# Patient Record
Sex: Female | Born: 1975 | Race: White | Hispanic: No | Marital: Married | Smoking: Never smoker
Health system: Southern US, Community
[De-identification: ages and names within clinical notes are randomized; demographics above are authoritative.]

## PROBLEM LIST (undated history)

## (undated) DIAGNOSIS — J45909 Unspecified asthma, uncomplicated: Secondary | ICD-10-CM

## (undated) DIAGNOSIS — L989 Disorder of the skin and subcutaneous tissue, unspecified: Secondary | ICD-10-CM

## (undated) DIAGNOSIS — F32A Depression, unspecified: Secondary | ICD-10-CM

## (undated) DIAGNOSIS — T7840XA Allergy, unspecified, initial encounter: Secondary | ICD-10-CM

## (undated) DIAGNOSIS — F329 Major depressive disorder, single episode, unspecified: Secondary | ICD-10-CM

## (undated) DIAGNOSIS — F419 Anxiety disorder, unspecified: Secondary | ICD-10-CM

## (undated) HISTORY — DX: Disorder of the skin and subcutaneous tissue, unspecified: L98.9

## (undated) HISTORY — DX: Unspecified asthma, uncomplicated: J45.909

## (undated) HISTORY — DX: Anxiety disorder, unspecified: F41.9

## (undated) HISTORY — DX: Allergy, unspecified, initial encounter: T78.40XA

## (undated) HISTORY — DX: Major depressive disorder, single episode, unspecified: F32.9

## (undated) HISTORY — DX: Depression, unspecified: F32.A

## (undated) HISTORY — PX: KNEE SURGERY: SHX244

---

## 2004-10-17 ENCOUNTER — Ambulatory Visit: Payer: Self-pay | Admitting: Unknown Physician Specialty

## 2007-01-02 ENCOUNTER — Observation Stay: Payer: Self-pay | Admitting: Obstetrics and Gynecology

## 2007-01-07 ENCOUNTER — Observation Stay: Payer: Self-pay

## 2007-01-15 ENCOUNTER — Inpatient Hospital Stay: Payer: Self-pay

## 2009-04-29 ENCOUNTER — Observation Stay: Payer: Self-pay | Admitting: Obstetrics and Gynecology

## 2009-05-10 ENCOUNTER — Observation Stay: Payer: Self-pay

## 2009-05-20 ENCOUNTER — Ambulatory Visit: Payer: Self-pay | Admitting: Obstetrics and Gynecology

## 2009-05-20 ENCOUNTER — Observation Stay: Payer: Self-pay | Admitting: Obstetrics and Gynecology

## 2009-05-21 ENCOUNTER — Ambulatory Visit: Payer: Self-pay

## 2009-05-30 ENCOUNTER — Inpatient Hospital Stay: Payer: Self-pay | Admitting: Obstetrics and Gynecology

## 2013-08-20 ENCOUNTER — Ambulatory Visit: Payer: Self-pay | Admitting: Obstetrics and Gynecology

## 2014-11-27 DIAGNOSIS — J302 Other seasonal allergic rhinitis: Secondary | ICD-10-CM | POA: Insufficient documentation

## 2015-01-22 ENCOUNTER — Ambulatory Visit: Payer: Self-pay | Admitting: Orthopedic Surgery

## 2015-11-09 ENCOUNTER — Other Ambulatory Visit: Payer: Self-pay | Admitting: Pain Medicine

## 2015-12-01 DIAGNOSIS — Z79899 Other long term (current) drug therapy: Secondary | ICD-10-CM | POA: Diagnosis not present

## 2015-12-01 DIAGNOSIS — Z862 Personal history of diseases of the blood and blood-forming organs and certain disorders involving the immune mechanism: Secondary | ICD-10-CM | POA: Diagnosis not present

## 2015-12-01 DIAGNOSIS — Z0000 Encounter for general adult medical examination without abnormal findings: Secondary | ICD-10-CM | POA: Diagnosis not present

## 2015-12-01 DIAGNOSIS — Z9189 Other specified personal risk factors, not elsewhere classified: Secondary | ICD-10-CM | POA: Diagnosis not present

## 2015-12-01 DIAGNOSIS — E538 Deficiency of other specified B group vitamins: Secondary | ICD-10-CM | POA: Diagnosis not present

## 2015-12-01 DIAGNOSIS — Z13220 Encounter for screening for lipoid disorders: Secondary | ICD-10-CM | POA: Diagnosis not present

## 2016-03-06 DIAGNOSIS — H7201 Central perforation of tympanic membrane, right ear: Secondary | ICD-10-CM | POA: Diagnosis not present

## 2016-05-10 DIAGNOSIS — H7201 Central perforation of tympanic membrane, right ear: Secondary | ICD-10-CM | POA: Diagnosis not present

## 2016-10-03 DIAGNOSIS — H5203 Hypermetropia, bilateral: Secondary | ICD-10-CM | POA: Diagnosis not present

## 2016-10-20 ENCOUNTER — Encounter: Payer: Self-pay | Admitting: Physician Assistant

## 2016-10-20 ENCOUNTER — Ambulatory Visit: Payer: Self-pay | Admitting: Physician Assistant

## 2016-10-20 VITALS — BP 110/79 | HR 96 | Temp 98.7°F

## 2016-10-20 DIAGNOSIS — H6981 Other specified disorders of Eustachian tube, right ear: Secondary | ICD-10-CM

## 2016-10-20 NOTE — Progress Notes (Signed)
S:  C/o ears popping and being stopped up, no drainage from ears, no fever/chills, no cough or congestion, some sinus pressure, remainder ros neg, hx of ruptured ear drum on r side Using otc meds without relief  O:  Vitals wnl, nad, tms dull b/l, nasal mucosa swollen, throat wnl, neck supple no lymph, lungs c t a, cv rrr, neuro intact  A: acute eustachean tube dysfunction  P: flonase, zyrtec d;  return if not improving in 3 to 5 days, return earlier if worsening

## 2016-12-04 ENCOUNTER — Encounter: Payer: Self-pay | Admitting: Primary Care

## 2016-12-04 ENCOUNTER — Ambulatory Visit (INDEPENDENT_AMBULATORY_CARE_PROVIDER_SITE_OTHER): Payer: 59 | Admitting: Primary Care

## 2016-12-04 VITALS — BP 122/78 | HR 58 | Temp 97.4°F | Ht 68.25 in | Wt 162.1 lb

## 2016-12-04 DIAGNOSIS — J302 Other seasonal allergic rhinitis: Secondary | ICD-10-CM | POA: Diagnosis not present

## 2016-12-04 DIAGNOSIS — Z0000 Encounter for general adult medical examination without abnormal findings: Secondary | ICD-10-CM | POA: Diagnosis not present

## 2016-12-04 DIAGNOSIS — E559 Vitamin D deficiency, unspecified: Secondary | ICD-10-CM

## 2016-12-04 DIAGNOSIS — Z0001 Encounter for general adult medical examination with abnormal findings: Secondary | ICD-10-CM | POA: Insufficient documentation

## 2016-12-04 LAB — LIPID PANEL
CHOL/HDL RATIO: 2
Cholesterol: 176 mg/dL (ref 0–200)
HDL: 92.7 mg/dL (ref >39.00)
LDL Cholesterol: 70 mg/dL (ref 0–99)
NONHDL: 83.09
Triglycerides: 64 mg/dL (ref 0.0–149.0)
VLDL: 12.8 mg/dL (ref 0.0–40.0)

## 2016-12-04 LAB — COMPREHENSIVE METABOLIC PANEL
ALT: 11 U/L (ref 0–35)
AST: 20 U/L (ref 0–37)
Albumin: 4.2 g/dL (ref 3.5–5.2)
Alkaline Phosphatase: 61 U/L (ref 39–117)
BILIRUBIN TOTAL: 0.7 mg/dL (ref 0.2–1.2)
BUN: 13 mg/dL (ref 6–23)
CO2: 28 meq/L (ref 19–32)
Calcium: 9.2 mg/dL (ref 8.4–10.5)
Chloride: 103 mEq/L (ref 96–112)
Creatinine, Ser: 0.85 mg/dL (ref 0.40–1.20)
GFR: 79.16 mL/min (ref >60.00)
GLUCOSE: 99 mg/dL (ref 70–99)
Potassium: 4.5 mEq/L (ref 3.5–5.1)
SODIUM: 139 meq/L (ref 135–145)
Total Protein: 7 g/dL (ref 6.0–8.3)

## 2016-12-04 LAB — VITAMIN D 25 HYDROXY (VIT D DEFICIENCY, FRACTURES): VITD: 62.18 ng/mL (ref 30.00–100.00)

## 2016-12-04 NOTE — Progress Notes (Signed)
Subjective:    Patient ID: Dawn Hamilton, female    DOB: Jul 24, 1976, 41 y.o.   MRN: [id number]  HPI  Dawn Hamilton is a 41 year old female who presents today to establish care, discuss the problems mentioned below, and for complete physical. Will obtain old records.  1) Anxiety and Depression: Diagnosed in 2010 due to acute illness of her son. She was managed on Clonazepam temporarily. She's not experienced symptoms in years.   Immunizations: -Tetanus: Completed in 2012 -Influenza: Completed in September 2017   Diet: She endorses a healthy diet. Breakfast: Egg whites, protein bar Lunch: Protein, veggies Dinner: Protein veggies Snacks: Veggies, rice cake, chex mix Desserts: Occasionally Beverages: Water, Coffee, occasional soda  Exercise: She jogs/does cardio 4-5 days weekly. Eye exam: Completed in 2017 Dental exam: Completes semi-annually Pap Smear: Completed in 2015, follows with GYN.   Review of Systems  Constitutional: Negative for unexpected weight change.  HENT: Negative for rhinorrhea.   Respiratory: Negative for cough and shortness of breath.   Cardiovascular: Negative for chest pain.  Gastrointestinal: Negative for constipation and diarrhea.  Genitourinary: Negative for difficulty urinating and menstrual problem.  Musculoskeletal: Negative for arthralgias and myalgias.  Skin: Negative for rash.  Allergic/Immunologic: Positive for environmental allergies.  Neurological: Negative for dizziness, numbness and headaches.  Psychiatric/Behavioral:       Denies concerns for anxiety or depression       Past Medical History:  Diagnosis Date  . Allergy   . Anxiety   . Asthma    exercise induced  . Depression   . Precancerous skin lesion      Social History   Social History  . Marital status: Married    Spouse name: N/A  . Number of children: N/A  . Years of education: N/A   Occupational History  . Not on file.   Social History Main Topics    . Smoking status: Never Smoker  . Smokeless tobacco: Never Used  . Alcohol use Not on file  . Drug use: Unknown  . Sexual activity: Not on file   Other Topics Concern  . Not on file   Social History Narrative   Married.   2 children.   Works as a Psychologist, forensic.   Enjoys spending time outdoors, going to the lake.    No past surgical history on file.  Family History  Problem Relation Age of Onset  . Colon cancer Maternal Grandfather   . Diabetes Maternal Grandfather   . Bladder Cancer Paternal Grandmother     Allergies  Allergen Reactions  . Codeine Nausea Only  . Wound Dressing Adhesive   . Benzoin Rash  . Iodine Rash    No current outpatient prescriptions on file prior to visit.   No current facility-administered medications on file prior to visit.     BP 122/78   Pulse (!) 58   Temp 97.4 F (36.3 C) (Oral)   Ht 5' 8.25" (1.734 m)   Wt 162 lb 1.9 oz (73.5 kg)   LMP 11/28/2016   SpO2 98%   BMI 24.47 kg/m    Objective:   Physical Exam  Constitutional: She is oriented to person, place, and time. She appears well-nourished.  HENT:  Right Ear: Tympanic membrane and ear canal normal.  Left Ear: Tympanic membrane and ear canal normal.  Nose: Nose normal.  Mouth/Throat: Oropharynx is clear and moist.  Eyes: Conjunctivae and EOM are normal. Pupils are equal, round, and reactive to light.  Neck:  Neck supple. No thyromegaly present.  Cardiovascular: Normal rate and regular rhythm.   No murmur heard. Pulmonary/Chest: Effort normal and breath sounds normal. She has no rales.  Abdominal: Soft. Bowel sounds are normal. There is no tenderness.  Musculoskeletal: Normal range of motion.  Lymphadenopathy:    She has no cervical adenopathy.  Neurological: She is alert and oriented to person, place, and time. She has normal reflexes. No cranial nerve deficit.  Skin: Skin is warm and dry. No rash noted.  Psychiatric: She has a normal mood and affect.           Assessment & Plan:

## 2016-12-04 NOTE — Assessment & Plan Note (Signed)
Immunizations UTD. Pap due this Fall, follows with GYN. Commended her on her healthy lifestyle, continue to exercise. Exam unremarkable. Labs pending. Follow up in 1 year for repeat physical.

## 2016-12-04 NOTE — Assessment & Plan Note (Signed)
Stable on Claritin. Continue same.

## 2016-12-04 NOTE — Patient Instructions (Signed)
Complete lab work prior to leaving today. I will notify you of your results once received.   Continue your efforts towards a healthy lifestyle.  Continue exercising. You should be getting 150 minutes of moderate intensity exercise weekly.  Ensure you are consuming 64 ounces of water daily.  Be sure to schedule your Pap this coming Fall.  Follow up in 1 year for an annual physical or sooner if needed.  It was a pleasure to meet you today! Please don't hesitate to call me with any questions. Welcome to [facility]!

## 2017-06-21 DIAGNOSIS — H66001 Acute suppurative otitis media without spontaneous rupture of ear drum, right ear: Secondary | ICD-10-CM | POA: Diagnosis not present

## 2017-10-10 DIAGNOSIS — H5202 Hypermetropia, left eye: Secondary | ICD-10-CM | POA: Diagnosis not present

## 2017-11-26 ENCOUNTER — Other Ambulatory Visit: Payer: Self-pay | Admitting: Primary Care

## 2017-11-26 DIAGNOSIS — Z0000 Encounter for general adult medical examination without abnormal findings: Secondary | ICD-10-CM

## 2017-11-29 ENCOUNTER — Encounter: Payer: Self-pay | Admitting: Primary Care

## 2017-11-29 DIAGNOSIS — R7989 Other specified abnormal findings of blood chemistry: Secondary | ICD-10-CM

## 2017-12-05 ENCOUNTER — Other Ambulatory Visit (INDEPENDENT_AMBULATORY_CARE_PROVIDER_SITE_OTHER): Payer: 59

## 2017-12-05 DIAGNOSIS — Z0000 Encounter for general adult medical examination without abnormal findings: Secondary | ICD-10-CM | POA: Diagnosis not present

## 2017-12-05 DIAGNOSIS — R7989 Other specified abnormal findings of blood chemistry: Secondary | ICD-10-CM

## 2017-12-05 LAB — COMPREHENSIVE METABOLIC PANEL
ALT: 13 U/L (ref 0–35)
AST: 19 U/L (ref 0–37)
Albumin: 4.3 g/dL (ref 3.5–5.2)
Alkaline Phosphatase: 64 U/L (ref 39–117)
BUN: 10 mg/dL (ref 6–23)
CHLORIDE: 105 meq/L (ref 96–112)
CO2: 24 meq/L (ref 19–32)
Calcium: 9.6 mg/dL (ref 8.4–10.5)
Creatinine, Ser: 0.88 mg/dL (ref 0.40–1.20)
GFR: 75.66 mL/min (ref >60.00)
GLUCOSE: 82 mg/dL (ref 70–99)
POTASSIUM: 4.1 meq/L (ref 3.5–5.1)
SODIUM: 142 meq/L (ref 135–145)
Total Bilirubin: 0.4 mg/dL (ref 0.2–1.2)
Total Protein: 7.2 g/dL (ref 6.0–8.3)

## 2017-12-05 LAB — LIPID PANEL
Cholesterol: 182 mg/dL (ref 0–200)
HDL: 88 mg/dL (ref >39.00)
LDL CALC: 78 mg/dL (ref 0–99)
NONHDL: 93.64
Total CHOL/HDL Ratio: 2
Triglycerides: 78 mg/dL (ref 0.0–149.0)
VLDL: 15.6 mg/dL (ref 0.0–40.0)

## 2017-12-05 LAB — VITAMIN D 25 HYDROXY (VIT D DEFICIENCY, FRACTURES): VITD: 52.57 ng/mL (ref 30.00–100.00)

## 2017-12-05 LAB — TSH: TSH: 1.38 u[IU]/mL (ref 0.35–4.50)

## 2017-12-07 ENCOUNTER — Encounter: Payer: Self-pay | Admitting: Primary Care

## 2017-12-07 ENCOUNTER — Ambulatory Visit (INDEPENDENT_AMBULATORY_CARE_PROVIDER_SITE_OTHER): Payer: 59 | Admitting: Primary Care

## 2017-12-07 VITALS — BP 104/60 | HR 71 | Temp 98.2°F | Ht 68.75 in | Wt 166.8 lb

## 2017-12-07 DIAGNOSIS — Z0000 Encounter for general adult medical examination without abnormal findings: Secondary | ICD-10-CM

## 2017-12-07 DIAGNOSIS — J302 Other seasonal allergic rhinitis: Secondary | ICD-10-CM | POA: Diagnosis not present

## 2017-12-07 NOTE — Assessment & Plan Note (Addendum)
Immunizations UTD. Pap smear due, she will see GYN. She will get her mammogram next month through her gynecologist once she turns 40. Discussed to continue working on diet, start exercising. Exam unremarkable. Labs unremarkable. Follow up in 1 year.

## 2017-12-07 NOTE — Progress Notes (Signed)
Subjective:    Patient ID: Dawn Hamilton, female    DOB: 1976-02-12, 42 y.o.   MRN: [id number]  HPI  Dawn Hamilton is a 42 year old female who presents today for complete physical.  Immunizations: -Tetanus: Completed in 2012 -Influenza: Completed in October 2018   Diet: She endorses a healthy diet. Breakfast: Yogurt, egg whites Lunch: Chicken, rice, vegetables, salads Dinner: Salads, meat, vegetables Snacks: Chex Mix, nuts, fruit Desserts: Occasionally  Beverages: Water, occasional soda  Exercise: She does not regularly exercise  Eye exam: Completed in 2018 Dental exam: Completes semi-annually Pap Smear: Following with GYN, due.   Review of Systems  Constitutional: Negative for unexpected weight change.  HENT: Negative for rhinorrhea.   Respiratory: Negative for cough and shortness of breath.   Cardiovascular: Negative for chest pain.  Gastrointestinal: Negative for constipation and diarrhea.  Genitourinary: Negative for difficulty urinating and menstrual problem.  Musculoskeletal: Negative for arthralgias and myalgias.  Skin: Negative for rash.  Allergic/Immunologic: Negative for environmental allergies.  Neurological: Negative for dizziness, numbness and headaches.  Psychiatric/Behavioral:       Denies concerns for anxiety or depression       Past Medical History:  Diagnosis Date  . Allergy   . Anxiety   . Asthma    exercise induced  . Depression   . Precancerous skin lesion      Social History   Socioeconomic History  . Marital status: Married    Spouse name: Not on file  . Number of children: Not on file  . Years of education: Not on file  . Highest education level: Not on file  Social Needs  . Financial resource strain: Not on file  . Food insecurity - worry: Not on file  . Food insecurity - inability: Not on file  . Transportation needs - medical: Not on file  . Transportation needs - non-medical: Not on file  Occupational History    . Not on file  Tobacco Use  . Smoking status: Never Smoker  . Smokeless tobacco: Never Used  Substance and Sexual Activity  . Alcohol use: Not on file  . Drug use: Not on file  . Sexual activity: Not on file  Other Topics Concern  . Not on file  Social History Narrative   Married.   2 children.   Works as a Psychologist, forensic.   Enjoys spending time outdoors, going to the lake.     Family History  Problem Relation Age of Onset  . Colon cancer Maternal Grandfather   . Diabetes Maternal Grandfather   . Bladder Cancer Paternal Grandmother     Allergies  Allergen Reactions  . Codeine Nausea Only  . Wound Dressing Adhesive   . Benzoin Rash  . Iodine Rash    Current Outpatient Medications on File Prior to Visit  Medication Sig Dispense Refill  . Cholecalciferol (VITAMIN D) 2000 units tablet     . loratadine (CLARITIN) 10 MG tablet Take 10 mg by mouth daily as needed for allergies.    . Multiple Vitamins-Minerals (MULTIVITAMIN ADULT) TABS      No current facility-administered medications on file prior to visit.     BP 104/60   Pulse 71   Temp 98.2 F (36.8 C) (Oral)   Ht 5' 8.75" (1.746 m)   Wt 166 lb 12.8 oz (75.7 kg)   LMP 11/23/2017   SpO2 99%   BMI 24.81 kg/m    Objective:   Physical Exam  Constitutional: She is  oriented to person, place, and time. She appears well-nourished.  HENT:  Right Ear: Tympanic membrane and ear canal normal.  Left Ear: Tympanic membrane and ear canal normal.  Nose: Nose normal.  Mouth/Throat: Oropharynx is clear and moist.  Eyes: Conjunctivae and EOM are normal. Pupils are equal, round, and reactive to light.  Neck: Neck supple. No thyromegaly present.  Cardiovascular: Normal rate and regular rhythm.  No murmur heard. Pulmonary/Chest: Effort normal and breath sounds normal. She has no rales.  Abdominal: Soft. Bowel sounds are normal. There is no tenderness.  Musculoskeletal: Normal range of motion.  Lymphadenopathy:    She has no  cervical adenopathy.  Neurological: She is alert and oriented to person, place, and time. She has normal reflexes. No cranial nerve deficit.  Skin: Skin is warm and dry. No rash noted.  Psychiatric: She has a normal mood and affect.          Assessment & Plan:

## 2017-12-07 NOTE — Assessment & Plan Note (Signed)
Stable on PRN claritin, continue same.

## 2017-12-07 NOTE — Patient Instructions (Signed)
Start exercising. You should be getting 150 minutes of moderate intensity exercise weekly.  Ensure you are consuming 64 ounces of water daily.  Continue working on a healthy diet.  Follow up with gynecology as discussed.  Follow up in 1 year for your annual exam or sooner if needed.  It was a pleasure to see you today!   Preventive Care 18-39 Years, Female Preventive care refers to lifestyle choices and visits with your health care provider that can promote health and wellness. What does preventive care include?  A yearly physical exam. This is also called an annual well check.  Dental exams once or twice a year.  Routine eye exams. Ask your health care provider how often you should have your eyes checked.  Personal lifestyle choices, including: ? Daily care of your teeth and gums. ? Regular physical activity. ? Eating a healthy diet. ? Avoiding tobacco and drug use. ? Limiting alcohol use. ? Practicing safe sex. ? Taking vitamin and mineral supplements as recommended by your health care provider. What happens during an annual well check? The services and screenings done by your health care provider during your annual well check will depend on your age, overall health, lifestyle risk factors, and family history of disease. Counseling Your health care provider may ask you questions about your:  Alcohol use.  Tobacco use.  Drug use.  Emotional well-being.  Home and relationship well-being.  Sexual activity.  Eating habits.  Work and work Statistician.  Method of birth control.  Menstrual cycle.  Pregnancy history.  Screening You may have the following tests or measurements:  Height, weight, and BMI.  Diabetes screening. This is done by checking your blood sugar (glucose) after you have not eaten for a while (fasting).  Blood pressure.  Lipid and cholesterol levels. These may be checked every 5 years starting at age 74.  Skin check.  Hepatitis C  blood test.  Hepatitis B blood test.  Sexually transmitted disease (STD) testing.  BRCA-related cancer screening. This may be done if you have a family history of breast, ovarian, tubal, or peritoneal cancers.  Pelvic exam and Pap test. This may be done every 3 years starting at age 84. Starting at age 7, this may be done every 5 years if you have a Pap test in combination with an HPV test.  Discuss your test results, treatment options, and if necessary, the need for more tests with your health care provider. Vaccines Your health care provider may recommend certain vaccines, such as:  Influenza vaccine. This is recommended every year.  Tetanus, diphtheria, and acellular pertussis (Tdap, Td) vaccine. You may need a Td booster every 10 years.  Varicella vaccine. You may need this if you have not been vaccinated.  HPV vaccine. If you are 78 or younger, you may need three doses over 6 months.  Measles, mumps, and rubella (MMR) vaccine. You may need at least one dose of MMR. You may also need a second dose.  Pneumococcal 13-valent conjugate (PCV13) vaccine. You may need this if you have certain conditions and were not previously vaccinated.  Pneumococcal polysaccharide (PPSV23) vaccine. You may need one or two doses if you smoke cigarettes or if you have certain conditions.  Meningococcal vaccine. One dose is recommended if you are age 57-21 years and a first-year college student living in a residence hall, or if you have one of several medical conditions. You may also need additional booster doses.  Hepatitis A vaccine. You may need this  if you have certain conditions or if you travel or work in places where you may be exposed to hepatitis A.  Hepatitis B vaccine. You may need this if you have certain conditions or if you travel or work in places where you may be exposed to hepatitis B.  Haemophilus influenzae type b (Hib) vaccine. You may need this if you have certain risk  factors.  Talk to your health care provider about which screenings and vaccines you need and how often you need them. This information is not intended to replace advice given to you by your health care provider. Make sure you discuss any questions you have with your health care provider. Document Released: 12/26/2001 Document Revised: 07/19/2016 Document Reviewed: 08/31/2015 Elsevier Interactive Patient Education  [employer].

## 2017-12-26 ENCOUNTER — Other Ambulatory Visit: Payer: Self-pay | Admitting: Obstetrics and Gynecology

## 2017-12-26 DIAGNOSIS — Z1231 Encounter for screening mammogram for malignant neoplasm of breast: Secondary | ICD-10-CM | POA: Diagnosis not present

## 2017-12-26 DIAGNOSIS — Z01419 Encounter for gynecological examination (general) (routine) without abnormal findings: Secondary | ICD-10-CM | POA: Diagnosis not present

## 2017-12-26 DIAGNOSIS — Z1211 Encounter for screening for malignant neoplasm of colon: Secondary | ICD-10-CM | POA: Diagnosis not present

## 2017-12-26 DIAGNOSIS — Z124 Encounter for screening for malignant neoplasm of cervix: Secondary | ICD-10-CM | POA: Diagnosis not present

## 2018-01-11 ENCOUNTER — Ambulatory Visit
Admission: RE | Admit: 2018-01-11 | Discharge: 2018-01-11 | Disposition: A | Discharge status: Home / Self Care | Payer: 59 | Source: Ambulatory Visit | Attending: Obstetrics and Gynecology | Admitting: Obstetrics and Gynecology

## 2018-01-11 DIAGNOSIS — Z1231 Encounter for screening mammogram for malignant neoplasm of breast: Secondary | ICD-10-CM | POA: Diagnosis not present

## 2018-11-25 ENCOUNTER — Other Ambulatory Visit: Payer: Self-pay | Admitting: Primary Care

## 2018-11-25 DIAGNOSIS — Z0000 Encounter for general adult medical examination without abnormal findings: Secondary | ICD-10-CM

## 2018-11-30 DIAGNOSIS — Z862 Personal history of diseases of the blood and blood-forming organs and certain disorders involving the immune mechanism: Secondary | ICD-10-CM

## 2018-11-30 DIAGNOSIS — Z0000 Encounter for general adult medical examination without abnormal findings: Secondary | ICD-10-CM

## 2018-12-04 ENCOUNTER — Other Ambulatory Visit (INDEPENDENT_AMBULATORY_CARE_PROVIDER_SITE_OTHER): Payer: 59

## 2018-12-04 DIAGNOSIS — Z0000 Encounter for general adult medical examination without abnormal findings: Secondary | ICD-10-CM

## 2018-12-04 DIAGNOSIS — Z862 Personal history of diseases of the blood and blood-forming organs and certain disorders involving the immune mechanism: Secondary | ICD-10-CM

## 2018-12-04 LAB — COMPREHENSIVE METABOLIC PANEL
ALT: 13 U/L (ref 0–35)
AST: 18 U/L (ref 0–37)
Albumin: 4.3 g/dL (ref 3.5–5.2)
Alkaline Phosphatase: 69 U/L (ref 39–117)
BUN: 10 mg/dL (ref 6–23)
CO2: 29 meq/L (ref 19–32)
Calcium: 9.7 mg/dL (ref 8.4–10.5)
Chloride: 103 mEq/L (ref 96–112)
Creatinine, Ser: 0.87 mg/dL (ref 0.40–1.20)
GFR: 71.77 mL/min (ref >60.00)
Glucose, Bld: 102 mg/dL — ABNORMAL HIGH (ref 70–99)
Potassium: 4.8 mEq/L (ref 3.5–5.1)
Sodium: 138 mEq/L (ref 135–145)
Total Bilirubin: 0.3 mg/dL (ref 0.2–1.2)
Total Protein: 6.7 g/dL (ref 6.0–8.3)

## 2018-12-04 LAB — T4, FREE: Free T4: 0.87 ng/dL (ref 0.60–1.60)

## 2018-12-04 LAB — CBC
HCT: 38.2 % (ref 36.0–46.0)
Hemoglobin: 12.3 g/dL (ref 12.0–15.0)
MCHC: 32.1 g/dL (ref 30.0–36.0)
MCV: 76.5 fl — ABNORMAL LOW (ref 78.0–100.0)
Platelets: 381 10*3/uL (ref 150.0–400.0)
RBC: 4.99 Mil/uL (ref 3.87–5.11)
RDW: 14.7 % (ref 11.5–15.5)
WBC: 6.1 10*3/uL (ref 4.0–10.5)

## 2018-12-04 LAB — LIPID PANEL
CHOL/HDL RATIO: 3
Cholesterol: 168 mg/dL (ref 0–200)
HDL: 66.1 mg/dL (ref >39.00)
LDL CALC: 79 mg/dL (ref 0–99)
NonHDL: 101.93
Triglycerides: 114 mg/dL (ref 0.0–149.0)
VLDL: 22.8 mg/dL (ref 0.0–40.0)

## 2018-12-04 LAB — IBC PANEL
IRON: 16 ug/dL — AB (ref 42–145)
Saturation Ratios: 3.7 % — ABNORMAL LOW (ref 20.0–50.0)
Transferrin: 305 mg/dL (ref 212.0–360.0)

## 2018-12-04 LAB — VITAMIN D 25 HYDROXY (VIT D DEFICIENCY, FRACTURES): VITD: 38.88 ng/mL (ref 30.00–100.00)

## 2018-12-04 LAB — TSH: TSH: 1.08 u[IU]/mL (ref 0.35–4.50)

## 2018-12-09 ENCOUNTER — Encounter: Payer: Self-pay | Admitting: Primary Care

## 2018-12-09 ENCOUNTER — Ambulatory Visit (INDEPENDENT_AMBULATORY_CARE_PROVIDER_SITE_OTHER): Payer: 59 | Admitting: Primary Care

## 2018-12-09 VITALS — BP 120/76 | HR 65 | Temp 98.0°F | Ht 68.75 in | Wt 171.8 lb

## 2018-12-09 DIAGNOSIS — D509 Iron deficiency anemia, unspecified: Secondary | ICD-10-CM | POA: Diagnosis not present

## 2018-12-09 DIAGNOSIS — Z0000 Encounter for general adult medical examination without abnormal findings: Secondary | ICD-10-CM

## 2018-12-09 DIAGNOSIS — Z1239 Encounter for other screening for malignant neoplasm of breast: Secondary | ICD-10-CM | POA: Diagnosis not present

## 2018-12-09 DIAGNOSIS — J302 Other seasonal allergic rhinitis: Secondary | ICD-10-CM | POA: Diagnosis not present

## 2018-12-09 NOTE — Assessment & Plan Note (Signed)
Doing well on PRN Claritin, also using Essential Oils. Continue to monitor.

## 2018-12-09 NOTE — Assessment & Plan Note (Signed)
Chronic. Previously taking oral iron, no recent use. Denies menorrhagia, rectal bleeding. No obvious cause for deficiency.  Will have her resume ferrous sulfate 325 mg daily and repeat labs in 2 months.

## 2018-12-09 NOTE — Assessment & Plan Note (Signed)
Immunizations UTD. Pap smear UTD. Mammogram due, orders placed. Encouraged a healthy diet, regular exercise. Exam unremarkable. Labs reviewed.  Follow up in 1 year for CPE.

## 2018-12-09 NOTE — Patient Instructions (Addendum)
Start ferrous sulfate 325 mg (Iron 65) once daily. Please notify me if you develop constipation.  Continue exercising. You should be getting 150 minutes of moderate intensity exercise weekly.  Continue to work on a healthy diet. Ensure you are consuming 64 ounces of water daily.  Call the [facility] to schedule your mammogram.  Schedule a lab only appointment for 2 months to recheck iron levels.   We will see you next year for your annual exam or sooner if needed.  It was a pleasure to see you today!   Preventive Care 40-64 Years, Female Preventive care refers to lifestyle choices and visits with your health care provider that can promote health and wellness. What does preventive care include?   A yearly physical exam. This is also called an annual well check.  Dental exams once or twice a year.  Routine eye exams. Ask your health care provider how often you should have your eyes checked.  Personal lifestyle choices, including: ? Daily care of your teeth and gums. ? Regular physical activity. ? Eating a healthy diet. ? Avoiding tobacco and drug use. ? Limiting alcohol use. ? Practicing safe sex. ? Taking low-dose aspirin daily starting at age 60. ? Taking vitamin and mineral supplements as recommended by your health care provider. What happens during an annual well check? The services and screenings done by your health care provider during your annual well check will depend on your age, overall health, lifestyle risk factors, and family history of disease. Counseling Your health care provider may ask you questions about your:  Alcohol use.  Tobacco use.  Drug use.  Emotional well-being.  Home and relationship well-being.  Sexual activity.  Eating habits.  Work and work Statistician.  Method of birth control.  Menstrual cycle.  Pregnancy history. Screening You may have the following tests or measurements:  Height, weight, and BMI.  Blood  pressure.  Lipid and cholesterol levels. These may be checked every 5 years, or more frequently if you are over 67 years old.  Skin check.  Lung cancer screening. You may have this screening every year starting at age 75 if you have a 30-pack-year history of smoking and currently smoke or have quit within the past 15 years.  Colorectal cancer screening. All adults should have this screening starting at age 21 and continuing until age 19. Your health care provider may recommend screening at age 12. You will have tests every 1-10 years, depending on your results and the type of screening test. People at increased risk should start screening at an earlier age. Screening tests may include: ? Guaiac-based fecal occult blood testing. ? Fecal immunochemical test (FIT). ? Stool DNA test. ? Virtual colonoscopy. ? Sigmoidoscopy. During this test, a flexible tube with a tiny camera (sigmoidoscope) is used to examine your rectum and lower colon. The sigmoidoscope is inserted through your anus into your rectum and lower colon. ? Colonoscopy. During this test, a long, thin, flexible tube with a tiny camera (colonoscope) is used to examine your entire colon and rectum.  Hepatitis C blood test.  Hepatitis B blood test.  Sexually transmitted disease (STD) testing.  Diabetes screening. This is done by checking your blood sugar (glucose) after you have not eaten for a while (fasting). You may have this done every 1-3 years.  Mammogram. This may be done every 1-2 years. Talk to your health care provider about when you should start having regular mammograms. This may depend on whether you have  a family history of breast cancer.  BRCA-related cancer screening. This may be done if you have a family history of breast, ovarian, tubal, or peritoneal cancers.  Pelvic exam and Pap test. This may be done every 3 years starting at age 76. Starting at age 46, this may be done every 5 years if you have a Pap test in  combination with an HPV test.  Bone density scan. This is done to screen for osteoporosis. You may have this scan if you are at high risk for osteoporosis. Discuss your test results, treatment options, and if necessary, the need for more tests with your health care provider. Vaccines Your health care provider may recommend certain vaccines, such as:  Influenza vaccine. This is recommended every year.  Tetanus, diphtheria, and acellular pertussis (Tdap, Td) vaccine. You may need a Td booster every 10 years.  Varicella vaccine. You may need this if you have not been vaccinated.  Zoster vaccine. You may need this after age 60.  Measles, mumps, and rubella (MMR) vaccine. You may need at least one dose of MMR if you were born in 1957 or later. You may also need a second dose.  Pneumococcal 13-valent conjugate (PCV13) vaccine. You may need this if you have certain conditions and were not previously vaccinated.  Pneumococcal polysaccharide (PPSV23) vaccine. You may need one or two doses if you smoke cigarettes or if you have certain conditions.  Meningococcal vaccine. You may need this if you have certain conditions.  Hepatitis A vaccine. You may need this if you have certain conditions or if you travel or work in places where you may be exposed to hepatitis A.  Hepatitis B vaccine. You may need this if you have certain conditions or if you travel or work in places where you may be exposed to hepatitis B.  Haemophilus influenzae type b (Hib) vaccine. You may need this if you have certain conditions. Talk to your health care provider about which screenings and vaccines you need and how often you need them. This information is not intended to replace advice given to you by your health care provider. Make sure you discuss any questions you have with your health care provider. Document Released: 11/26/2015 Document Revised: 12/20/2017 Document Reviewed: 08/31/2015 Elsevier Interactive Patient  Education  2019 [employer].

## 2018-12-09 NOTE — Progress Notes (Signed)
Subjective:    Patient ID: Dawn Hamilton, female    DOB: [birth year], 43 y.o.   MRN: [id number]  HPI  Dawn Hamilton is a 43 year old female who presents today for complete physical.  Immunizations: -Tetanus: Completed in 2012 -Influenza: Completed this season 2019   Diet: She endorses a healthy diet Breakfast: Yogurt with fruit, cereal, protein bar Lunch: Chicken, salad, sweet potato  Dinner: Meat, vegetable, starch Snacks: Occasionally nuts, vegetables, fruit Desserts: None Beverages: Water, coffee  Exercise: She is walking/jogging 3-4 days weekly, 30 minutes  Eye exam: Completed in 2019 Dental exam: Completes semi-annually  Pap Smear: Completed in February 2019 Mammogram: Completed in 2019   Review of Systems  Constitutional: Negative for unexpected weight change.  HENT: Negative for rhinorrhea.   Respiratory: Negative for cough and shortness of breath.   Cardiovascular: Negative for chest pain.  Gastrointestinal: Negative for constipation and diarrhea.  Genitourinary: Negative for difficulty urinating and menstrual problem.  Musculoskeletal: Negative for arthralgias and myalgias.  Skin: Negative for rash.  Allergic/Immunologic: Negative for environmental allergies.  Neurological: Negative for dizziness, numbness and headaches.  Psychiatric/Behavioral: The patient is not nervous/anxious.        Past Medical History:  Diagnosis Date  . Allergy   . Anxiety   . Asthma    exercise induced  . Depression   . Precancerous skin lesion      Social History   Socioeconomic History  . Marital status: Married    Spouse name: Not on file  . Number of children: Not on file  . Years of education: Not on file  . Highest education level: Not on file  Occupational History  . Not on file  Social Needs  . Financial resource strain: Not on file  . Food insecurity:    Worry: Not on file    Inability: Not on file  . Transportation needs:    Medical: Not on  file    Non-medical: Not on file  Tobacco Use  . Smoking status: Never Smoker  . Smokeless tobacco: Never Used  Substance and Sexual Activity  . Alcohol use: Not on file  . Drug use: Not on file  . Sexual activity: Not on file  Lifestyle  . Physical activity:    Days per week: Not on file    Minutes per session: Not on file  . Stress: Not on file  Relationships  . Social connections:    Talks on phone: Not on file    Gets together: Not on file    Attends religious service: Not on file    Active member of club or organization: Not on file    Attends meetings of clubs or organizations: Not on file    Relationship status: Not on file  . Intimate partner violence:    Fear of current or ex partner: Not on file    Emotionally abused: Not on file    Physically abused: Not on file    Forced sexual activity: Not on file  Other Topics Concern  . Not on file  Social History Narrative   Married.   2 children.   Works as a Psychologist, forensic.   Enjoys spending time outdoors, going to the lake.    No past surgical history on file.  Family History  Problem Relation Age of Onset  . Colon cancer Maternal Grandfather   . Diabetes Maternal Grandfather   . Bladder Cancer Paternal Grandmother     Allergies  Allergen Reactions  .  Codeine Nausea Only  . Wound Dressing Adhesive   . Benzoin Rash  . Iodine Rash    Current Outpatient Medications on File Prior to Visit  Medication Sig Dispense Refill  . loratadine (CLARITIN) 10 MG tablet Take 10 mg by mouth daily as needed for allergies.    . Multiple Vitamins-Minerals (MULTIVITAMIN ADULT) TABS      No current facility-administered medications on file prior to visit.     BP 120/76   Pulse 65   Temp 98 F (36.7 C) (Oral)   Ht 5' 8.75" (1.746 m)   Wt 171 lb 12 oz (77.9 kg)   LMP 11/19/2018   SpO2 99%   BMI 25.55 kg/m    Objective:   Physical Exam  Constitutional: She is oriented to person, place, and time. She appears  well-nourished.  HENT:  Mouth/Throat: No oropharyngeal exudate.  Eyes: Pupils are equal, round, and reactive to light. EOM are normal.  Neck: Neck supple. No thyromegaly present.  Cardiovascular: Normal rate and regular rhythm.  Respiratory: Effort normal and breath sounds normal.  GI: Soft. Bowel sounds are normal. There is no abdominal tenderness.  Musculoskeletal: Normal range of motion.  Neurological: She is alert and oriented to person, place, and time.  Skin: Skin is warm and dry.  Psychiatric: She has a normal mood and affect.           Assessment & Plan:

## 2019-02-13 ENCOUNTER — Other Ambulatory Visit: Payer: Self-pay

## 2019-02-13 ENCOUNTER — Other Ambulatory Visit (INDEPENDENT_AMBULATORY_CARE_PROVIDER_SITE_OTHER): Payer: 59

## 2019-02-13 DIAGNOSIS — D509 Iron deficiency anemia, unspecified: Secondary | ICD-10-CM

## 2019-02-13 LAB — IBC PANEL
Iron: 20 ug/dL — ABNORMAL LOW (ref 42–145)
Saturation Ratios: 5.2 % — ABNORMAL LOW (ref 20.0–50.0)
Transferrin: 275 mg/dL (ref 212.0–360.0)

## 2019-02-13 LAB — CBC
HCT: 39.8 % (ref 36.0–46.0)
Hemoglobin: 13.1 g/dL (ref 12.0–15.0)
MCHC: 32.9 g/dL (ref 30.0–36.0)
MCV: 80.7 fl (ref 78.0–100.0)
Platelets: 322 10*3/uL (ref 150.0–400.0)
RBC: 4.93 Mil/uL (ref 3.87–5.11)
RDW: 19.4 % — ABNORMAL HIGH (ref 11.5–15.5)
WBC: 5.6 10*3/uL (ref 4.0–10.5)

## 2019-02-23 NOTE — Progress Notes (Signed)
[place]  Telephone:([phone] Fax:([phone]  HEMATOLOGY-ONCOLOGY TELEMEDICINE VISIT PROGRESS NOTE  ID: Launa Flight OB: [birth year]  MR#: [id number]  PRF#:[id number]  Patient Care Team: [name], NP as PCP - General (Internal Medicine)  I connected with Jorje Guild on 02/23/19 at  9:45 AM EDT by video enabled telemedicine visit and verified that I am speaking with the correct person using two identifiers.   I discussed the limitations, risks, security and privacy concerns of performing an evaluation and management service by telemedicine and the availability of in-person appointments. I also discussed with the patient that there may be a patient responsible charge related to this service. The patient expressed understanding and agreed to proceed.  Other persons participating in the visit and their role in the encounter: Nursing, patient  PATIENT'S LOCATION: Home PROVIDER'S LOCATION: Clinic  CHIEF COMPLAINT:  Iron deficiency anemia  INTERVAL HISTORY: Patient is a 43 year old female with persistent iron deficiency anemia despite oral iron supplementation.  She has occasional nausea with her iron supplements, but otherwise feels well.  She has intermittent weakness and fatigue.  She has no neurologic complaints.  She denies any recent fevers or illnesses.  She has a good appetite and denies weight loss.  She has no chest pain, shortness of breath, cough, or hemoptysis.  She has no nausea, vomiting, constipation, or diarrhea.  She has no melena or hematochezia.  She has no urinary complaints.  Patient otherwise feels well and offers no further specific complaints today.  REVIEW OF SYSTEMS:   Review of Systems  Constitutional: Positive for malaise/fatigue. Negative for fever and weight loss.  Respiratory: Negative.  Negative for cough, hemoptysis and shortness of breath.   Cardiovascular: Negative.  Negative for chest pain and leg  swelling.  Gastrointestinal: Negative.  Negative for abdominal pain, blood in stool and melena.  Genitourinary: Negative.  Negative for hematuria.  Musculoskeletal: Negative.  Negative for back pain.  Skin: Negative.  Negative for rash.  Neurological: Negative.  Negative for dizziness, focal weakness, weakness and headaches.  Psychiatric/Behavioral: Negative.  The patient is not nervous/anxious.     As per HPI. Otherwise, a complete review of systems is negative.  PAST MEDICAL HISTORY: Past Medical History:  Diagnosis Date  . Allergy   . Anxiety   . Asthma    exercise induced  . Depression   . Precancerous skin lesion     PAST SURGICAL HISTORY: No past surgical history on file.  FAMILY HISTORY: Family History  Problem Relation Age of Onset  . Colon cancer Maternal Grandfather   . Diabetes Maternal Grandfather   . Bladder Cancer Paternal Grandmother     ADVANCED DIRECTIVES (Y/N):  N  HEALTH MAINTENANCE: Social History   Tobacco Use  . Smoking status: Never Smoker  . Smokeless tobacco: Never Used  Substance Use Topics  . Alcohol use: Not on file  . Drug use: Not on file     Colonoscopy:  PAP:  Bone density:  Lipid panel:  Allergies  Allergen Reactions  . Codeine Nausea Only  . Wound Dressing Adhesive   . Benzoin Rash  . Iodine Rash    Current Outpatient Medications  Medication Sig Dispense Refill  . loratadine (CLARITIN) 10 MG tablet Take 10 mg by mouth daily as needed for allergies.    . Multiple Vitamins-Minerals (MULTIVITAMIN ADULT) TABS      No current facility-administered medications for this visit.     OBJECTIVE: There were no vitals filed for this  visit.   There is no height or weight on file to calculate BMI.    ECOG FS:0 - Asymptomatic  General: Well-developed, well-nourished, no acute distress. HEENT: Normocephalic. Neuro: Alert, answering all questions appropriately. Cranial nerves grossly intact. Skin: No rashes or petechiae noted.  Psych: Normal affect.   LAB RESULTS:  Lab Results  Component Value Date   NA 138 12/04/2018   K 4.8 12/04/2018   CL 103 12/04/2018   CO2 29 12/04/2018   GLUCOSE 102 (H) 12/04/2018   BUN 10 12/04/2018   CREATININE 0.87 12/04/2018   CALCIUM 9.7 12/04/2018   PROT 6.7 12/04/2018   ALBUMIN 4.3 12/04/2018   AST 18 12/04/2018   ALT 13 12/04/2018   ALKPHOS 69 12/04/2018   BILITOT 0.3 12/04/2018    Lab Results  Component Value Date   WBC 5.6 02/13/2019   HGB 13.1 02/13/2019   HCT 39.8 02/13/2019   MCV 80.7 02/13/2019   PLT 322.0 02/13/2019   Lab Results  Component Value Date   IRON 20 (L) 02/13/2019   IRONPCTSAT 5.2 (L) 02/13/2019   No results found for: FERRITIN   STUDIES: No results found.  ASSESSMENT: Iron deficiency anemia  PLAN:    1.  Iron deficiency anemia: Mild.  Patient's hemoglobin is now within normal limits at 13.1.  Her total iron as well as iron saturation are decreased, but have trended up since she initiated oral iron supplementation.  She does not require IV Feraheme at this time, but will consider in 3 months after additional trial of oral iron.  She has been instructed to decrease her dose to once per day given her nausea.  Return to clinic in 3 months with repeat laboratory work and further evaluation. 2.  Nausea: Likely secondary to iron supplementation.  Decrease doses to once per day.    I discussed the assessment and treatment plan with the patient. The patient was provided an opportunity to ask questions and all were answered. The patient agreed with the plan and demonstrated an understanding of the instructions.   The patient was advised to call back or seek an in-person evaluation if the symptoms worsen or if the condition fails to improve as anticipated.  I provided 45 minutes of face-to-face video visit time during this encounter, and > 50% was spent counseling as documented under my assessment & plan.  Lloyd Huger, MD 02/23/2019  8:52 PM  Cancer Staging No matching staging information was found for the patient.

## 2019-02-24 ENCOUNTER — Other Ambulatory Visit: Payer: Self-pay

## 2019-02-25 ENCOUNTER — Inpatient Hospital Stay: Payer: 59 | Attending: Oncology | Admitting: Oncology

## 2019-02-25 DIAGNOSIS — D509 Iron deficiency anemia, unspecified: Secondary | ICD-10-CM

## 2019-02-25 NOTE — Progress Notes (Signed)
Patient webex consultation visit today for anemia.

## 2019-05-26 ENCOUNTER — Other Ambulatory Visit: Payer: Self-pay

## 2019-05-27 ENCOUNTER — Inpatient Hospital Stay: Payer: 59 | Attending: Oncology

## 2019-05-27 ENCOUNTER — Other Ambulatory Visit: Payer: Self-pay

## 2019-05-27 DIAGNOSIS — D509 Iron deficiency anemia, unspecified: Secondary | ICD-10-CM | POA: Insufficient documentation

## 2019-05-27 DIAGNOSIS — Z79899 Other long term (current) drug therapy: Secondary | ICD-10-CM | POA: Insufficient documentation

## 2019-05-27 DIAGNOSIS — J45909 Unspecified asthma, uncomplicated: Secondary | ICD-10-CM | POA: Diagnosis not present

## 2019-05-27 DIAGNOSIS — F419 Anxiety disorder, unspecified: Secondary | ICD-10-CM | POA: Diagnosis not present

## 2019-05-27 DIAGNOSIS — F329 Major depressive disorder, single episode, unspecified: Secondary | ICD-10-CM | POA: Insufficient documentation

## 2019-05-27 LAB — CBC WITH DIFFERENTIAL/PLATELET
Abs Immature Granulocytes: 0.01 10*3/uL (ref 0.00–0.07)
Basophils Absolute: 0.1 10*3/uL (ref 0.0–0.1)
Basophils Relative: 1 %
Eosinophils Absolute: 0.2 10*3/uL (ref 0.0–0.5)
Eosinophils Relative: 3 %
HCT: 35.5 % — ABNORMAL LOW (ref 36.0–46.0)
Hemoglobin: 11.6 g/dL — ABNORMAL LOW (ref 12.0–15.0)
Immature Granulocytes: 0 %
Lymphocytes Relative: 37 %
Lymphs Abs: 2.7 10*3/uL (ref 0.7–4.0)
MCH: 27.6 pg (ref 26.0–34.0)
MCHC: 32.7 g/dL (ref 30.0–36.0)
MCV: 84.3 fL (ref 80.0–100.0)
Monocytes Absolute: 0.7 10*3/uL (ref 0.1–1.0)
Monocytes Relative: 9 %
Neutro Abs: 3.7 10*3/uL (ref 1.7–7.7)
Neutrophils Relative %: 50 %
Platelets: 382 10*3/uL (ref 150–400)
RBC: 4.21 MIL/uL (ref 3.87–5.11)
RDW: 14.1 % (ref 11.5–15.5)
WBC: 7.3 10*3/uL (ref 4.0–10.5)
nRBC: 0 % (ref 0.0–0.2)

## 2019-05-27 LAB — IRON AND TIBC
Iron: 22 ug/dL — ABNORMAL LOW (ref 28–170)
Saturation Ratios: 5 % — ABNORMAL LOW (ref 10.4–31.8)
TIBC: 406 ug/dL (ref 250–450)
UIBC: 384 ug/dL

## 2019-05-27 LAB — VITAMIN B12: Vitamin B-12: 206 pg/mL (ref 180–914)

## 2019-05-27 LAB — FOLATE: Folate: 19.2 ng/mL (ref >5.9)

## 2019-05-27 LAB — FERRITIN: Ferritin: 5 ng/mL — ABNORMAL LOW (ref 11–307)

## 2019-06-01 NOTE — Progress Notes (Signed)
[place] [facility]  Telephone:([phone] Fax:([phone]  HEMATOLOGY-ONCOLOGY TELEMEDICINE VISIT PROGRESS NOTE  ID: Dawn Hamilton OB: [birth year]  MR#: [id number]  CVE#:[id number]  Patient Care Team: [name], NP as PCP - General (Internal Medicine)   CHIEF COMPLAINT:  Iron deficiency anemia  INTERVAL HISTORY: Patient returns to clinic today for repeat laboratory work, further evaluation, and consideration of IV iron.  She currently feels well and is asymptomatic. She has no neurologic complaints.  She denies any recent fevers or illnesses.  She has a good appetite and denies weight loss.  She has no chest pain, shortness of breath, cough, or hemoptysis.  She has no nausea, vomiting, constipation, or diarrhea.  She has no melena or hematochezia.  She has no urinary complaints.  Patient feels at her baseline offers no specific complaints today.  REVIEW OF SYSTEMS:   Review of Systems  Constitutional: Negative.  Negative for fever, malaise/fatigue and weight loss.  Respiratory: Negative.  Negative for cough, hemoptysis and shortness of breath.   Cardiovascular: Negative.  Negative for chest pain and leg swelling.  Gastrointestinal: Negative.  Negative for abdominal pain, blood in stool and melena.  Genitourinary: Negative.  Negative for hematuria.  Musculoskeletal: Negative.  Negative for back pain.  Skin: Negative.  Negative for rash.  Neurological: Negative.  Negative for dizziness, focal weakness, weakness and headaches.  Psychiatric/Behavioral: Negative.  The patient is not nervous/anxious.     As per HPI. Otherwise, a complete review of systems is negative.  PAST MEDICAL HISTORY: Past Medical History:  Diagnosis Date  . Allergy   . Anxiety   . Asthma    exercise induced  . Depression   . Precancerous skin lesion     PAST SURGICAL HISTORY: No past surgical history on file.  FAMILY HISTORY: Family History  Problem Relation Age of  Onset  . Colon cancer Maternal Grandfather   . Diabetes Maternal Grandfather   . Bladder Cancer Paternal Grandmother     ADVANCED DIRECTIVES (Y/N):  N  HEALTH MAINTENANCE: Social History   Tobacco Use  . Smoking status: Never Smoker  . Smokeless tobacco: Never Used  Substance Use Topics  . Alcohol use: Not on file  . Drug use: Not on file     Colonoscopy:  PAP:  Bone density:  Lipid panel:  Allergies  Allergen Reactions  . Codeine Nausea Only  . Wound Dressing Adhesive   . Benzoin Rash  . Iodine Rash    Current Outpatient Medications  Medication Sig Dispense Refill  . Ferrous Sulfate (IRON) 325 (65 Fe) MG TABS 2 (two) times daily.     Marland Kitchen loratadine (CLARITIN) 10 MG tablet Take 10 mg by mouth daily as needed for allergies.    . Multiple Vitamins-Minerals (MULTIVITAMIN ADULT) TABS      No current facility-administered medications for this visit.     OBJECTIVE: Vitals:   06/06/19 1339  BP: 108/75  Pulse: 67  Resp: 16  Temp: (!) 96.8 F (36 C)     Body mass index is 25.73 kg/m.    ECOG FS:0 - Asymptomatic  General: Well-developed, well-nourished, no acute distress. Eyes: Pink conjunctiva, anicteric sclera. HEENT: Normocephalic, moist mucous membranes, clear oropharnyx. Lungs: Clear to auscultation bilaterally. Heart: Regular rate and rhythm. No rubs, murmurs, or gallops. Abdomen: Soft, nontender, nondistended. No organomegaly noted, normoactive bowel sounds. Musculoskeletal: No edema, cyanosis, or clubbing. Neuro: Alert, answering all questions appropriately. Cranial nerves grossly intact. Skin: No rashes or petechiae noted. Psych: Normal  affect. Lymphatics: No cervical, calvicular, axillary or inguinal LAD.   LAB RESULTS:  Lab Results  Component Value Date   NA 138 12/04/2018   K 4.8 12/04/2018   CL 103 12/04/2018   CO2 29 12/04/2018   GLUCOSE 102 (H) 12/04/2018   BUN 10 12/04/2018   CREATININE 0.87 12/04/2018   CALCIUM 9.7 12/04/2018   PROT  6.7 12/04/2018   ALBUMIN 4.3 12/04/2018   AST 18 12/04/2018   ALT 13 12/04/2018   ALKPHOS 69 12/04/2018   BILITOT 0.3 12/04/2018    Lab Results  Component Value Date   WBC 7.3 05/27/2019   NEUTROABS 3.7 05/27/2019   HGB 11.6 (L) 05/27/2019   HCT 35.5 (L) 05/27/2019   MCV 84.3 05/27/2019   PLT 382 05/27/2019   Lab Results  Component Value Date   IRON 22 (L) 05/27/2019   TIBC 406 05/27/2019   IRONPCTSAT 5 (L) 05/27/2019   Lab Results  Component Value Date   FERRITIN 5 (L) 05/27/2019     STUDIES: No results found.  ASSESSMENT: Iron deficiency anemia  PLAN:    1.  Iron deficiency anemia: Despite a trial of increasing oral iron supplementation, patient's hemoglobin has trended down and her iron stores are significantly reduced.  Will proceed with 510 mg IV Feraheme today.  Return to clinic in 1 week for second infusion.  Patient will then return to clinic in 3 months with repeat laboratory work, further evaluation, and continuation of treatment.   I spent a total of 30 minutes face-to-face with the patient of which greater than 50% of the visit was spent in counseling and coordination of care as detailed above.  Lloyd Huger, MD 06/08/2019 9:01 AM

## 2019-06-02 ENCOUNTER — Ambulatory Visit: Payer: Self-pay

## 2019-06-02 ENCOUNTER — Inpatient Hospital Stay: Payer: 59 | Admitting: Oncology

## 2019-06-04 ENCOUNTER — Other Ambulatory Visit: Payer: Self-pay

## 2019-06-04 DIAGNOSIS — D509 Iron deficiency anemia, unspecified: Secondary | ICD-10-CM

## 2019-06-05 ENCOUNTER — Other Ambulatory Visit: Payer: Self-pay

## 2019-06-06 ENCOUNTER — Other Ambulatory Visit: Payer: Self-pay

## 2019-06-06 ENCOUNTER — Inpatient Hospital Stay (HOSPITAL_BASED_OUTPATIENT_CLINIC_OR_DEPARTMENT_OTHER): Payer: 59 | Admitting: Oncology

## 2019-06-06 ENCOUNTER — Encounter: Payer: Self-pay | Admitting: Oncology

## 2019-06-06 ENCOUNTER — Inpatient Hospital Stay: Payer: 59

## 2019-06-06 VITALS — BP 118/86 | HR 55 | Resp 17

## 2019-06-06 VITALS — BP 108/75 | HR 67 | Temp 96.8°F | Resp 16 | Wt 173.0 lb

## 2019-06-06 DIAGNOSIS — F419 Anxiety disorder, unspecified: Secondary | ICD-10-CM | POA: Diagnosis not present

## 2019-06-06 DIAGNOSIS — D509 Iron deficiency anemia, unspecified: Secondary | ICD-10-CM

## 2019-06-06 DIAGNOSIS — F329 Major depressive disorder, single episode, unspecified: Secondary | ICD-10-CM

## 2019-06-06 DIAGNOSIS — J45909 Unspecified asthma, uncomplicated: Secondary | ICD-10-CM

## 2019-06-06 DIAGNOSIS — Z79899 Other long term (current) drug therapy: Secondary | ICD-10-CM | POA: Diagnosis not present

## 2019-06-06 MED ORDER — SODIUM CHLORIDE 0.9 % IV SOLN
Freq: Once | INTRAVENOUS | Status: AC
  Administered 2019-06-06: 14:00:00 via INTRAVENOUS
  Filled 2019-06-06: qty 250

## 2019-06-06 MED ORDER — SODIUM CHLORIDE 0.9 % IV SOLN
510.0000 mg | Freq: Once | INTRAVENOUS | Status: AC
  Administered 2019-06-06: 14:00:00 510 mg via INTRAVENOUS
  Filled 2019-06-06: qty 17

## 2019-06-06 NOTE — Progress Notes (Signed)
Patient is here for follow up, she is doing well no complaints.  

## 2019-06-06 NOTE — Progress Notes (Signed)
Pt tolerated Feraheme well with no signs of reaction or complications. Pt was observed for 30 minutes after Feraheme infusion.VSS prior to infusion and prior to discharge. Pt educated that if complications arise at home to call 911 immediately. Pt verbalizes understanding and all questions answered at this time.   Dawn Hamilton [employer]

## 2019-06-12 ENCOUNTER — Other Ambulatory Visit: Payer: Self-pay

## 2019-06-13 ENCOUNTER — Other Ambulatory Visit: Payer: Self-pay

## 2019-06-13 ENCOUNTER — Inpatient Hospital Stay: Payer: 59

## 2019-06-13 VITALS — BP 121/78 | HR 72 | Temp 97.1°F | Resp 18

## 2019-06-13 DIAGNOSIS — D509 Iron deficiency anemia, unspecified: Secondary | ICD-10-CM

## 2019-06-13 DIAGNOSIS — Z79899 Other long term (current) drug therapy: Secondary | ICD-10-CM | POA: Diagnosis not present

## 2019-06-13 DIAGNOSIS — F419 Anxiety disorder, unspecified: Secondary | ICD-10-CM | POA: Diagnosis not present

## 2019-06-13 DIAGNOSIS — J45909 Unspecified asthma, uncomplicated: Secondary | ICD-10-CM | POA: Diagnosis not present

## 2019-06-13 DIAGNOSIS — F329 Major depressive disorder, single episode, unspecified: Secondary | ICD-10-CM | POA: Diagnosis not present

## 2019-06-13 MED ORDER — SODIUM CHLORIDE 0.9 % IV SOLN
Freq: Once | INTRAVENOUS | Status: AC
  Administered 2019-06-13: 14:00:00 via INTRAVENOUS
  Filled 2019-06-13: qty 250

## 2019-06-13 MED ORDER — SODIUM CHLORIDE 0.9 % IV SOLN
510.0000 mg | Freq: Once | INTRAVENOUS | Status: AC
  Administered 2019-06-13: 14:00:00 510 mg via INTRAVENOUS
  Filled 2019-06-13: qty 17

## 2019-06-17 ENCOUNTER — Ambulatory Visit (INDEPENDENT_AMBULATORY_CARE_PROVIDER_SITE_OTHER): Payer: 59 | Admitting: Family Medicine

## 2019-06-17 ENCOUNTER — Encounter: Payer: Self-pay | Admitting: Family Medicine

## 2019-06-17 DIAGNOSIS — L247 Irritant contact dermatitis due to plants, except food: Secondary | ICD-10-CM | POA: Insufficient documentation

## 2019-06-17 HISTORY — DX: Irritant contact dermatitis due to plants, except food: L24.7

## 2019-06-17 MED ORDER — PREDNISONE 20 MG PO TABS: ORAL_TABLET | ORAL | 0 refills | Status: DC

## 2019-06-17 NOTE — Progress Notes (Signed)
VIRTUAL VISIT Due to national recommendations of social distancing due to [place] 19, a virtual visit is felt to be most appropriate for this patient at this time.   I connected with the patient on 06/17/19 at  9:20 AM EDT by virtual telehealth platform and verified that I am speaking with the correct person using two identifiers.   I discussed the limitations, risks, security and privacy concerns of performing an evaluation and management service by  virtual telehealth platform and the availability of in person appointments. I also discussed with the patient that there may be a patient responsible charge related to this service. The patient expressed understanding and agreed to proceed.  Patient location: Home Provider Location: [facility] Participants: Dawn Hamilton and Dawn Hamilton   Chief Complaint  Patient presents with  . Poison Ivy    History of Present Illness: Poison Dawn Hamilton This is a new problem. The current episode started 1 to 4 weeks ago. The problem has been gradually worsening since onset. The affected locations include the right foot, left lower leg, right lower leg, left foot, left ankle and right ankle. The rash is characterized by itchiness, redness and blistering. She was exposed to plant contact (weed eater the weekend prior). Pertinent negatives include no congestion, diarrhea, facial edema, fatigue, fever, rhinorrhea, shortness of breath or sore throat. Past treatments include anti-itch cream and antihistamine. The treatment provided mild relief. There is no history of allergies, asthma, eczema or varicella.     COVID 19 screen No recent travel or known exposure to COVID19 The patient denies respiratory symptoms of COVID 19 at this time.  The importance of social distancing was discussed today.   Review of Systems  Constitutional: Negative for fatigue and fever.  HENT: Negative for congestion, rhinorrhea and sore throat.   Respiratory: Negative for  shortness of breath.   Gastrointestinal: Negative for diarrhea.      Past Medical History:  Diagnosis Date  . Allergy   . Anxiety   . Asthma    exercise induced  . Depression   . Precancerous skin lesion     reports that she has never smoked. She has never used smokeless tobacco.   Current Outpatient Medications:  .  Ferrous Sulfate (IRON) 325 (65 Fe) MG TABS, 2 (two) times daily. , Disp: , Rfl:  .  loratadine (CLARITIN) 10 MG tablet, Take 10 mg by mouth daily as needed for allergies., Disp: , Rfl:  .  Multiple Vitamins-Minerals (MULTIVITAMIN ADULT) TABS, , Disp: , Rfl:    Observations/Objective: Blood pressure 126/78, temperature (!) 97.5 F (36.4 C), temperature source Temporal, height 5' 8.75" (1.746 m), weight 173 lb (78.5 kg).  Physical Exam  Physical Exam Constitutional:      General: The patient is not in acute distress. Pulmonary:     Effort: Pulmonary effort is normal. No respiratory distress.  Neurological:     Mental Status: The patient is alert and oriented to person, place, and time.  Psychiatric:        Mood and Affect: Mood normal.        Behavior: Behavior normal.  RASH: excoriations and linear red bumps on legs and stomach shown. HArd to determine if blisters present ( pt says yes)  Assessment and Plan    I discussed the assessment and treatment plan with the patient. The patient was provided an opportunity to ask questions and all were answered. The patient agreed with the plan and demonstrated an understanding of the instructions.  The patient was advised to call back or seek an in-person evaluation if the symptoms worsen or if the condition fails to improve as anticipated.   Plant irritant contact dermatitis  Spreading itchy rash consistent with plant dermatitis. Treat with oral pred taper. NO red flags for anaphylaxis or respiratory compromise.. reviewed ER precautions with pt.    Dawn Lofts, MD

## 2019-06-17 NOTE — Assessment & Plan Note (Signed)
Spreading itchy rash consistent with plant dermatitis. Treat with oral pred taper. NO red flags for anaphylaxis or respiratory compromise.. reviewed ER precautions with pt.

## 2019-07-18 ENCOUNTER — Ambulatory Visit
Admission: RE | Admit: 2019-07-18 | Discharge: 2019-07-18 | Disposition: A | Discharge status: Home / Self Care | Payer: 59 | Source: Ambulatory Visit | Attending: Primary Care | Admitting: Primary Care

## 2019-07-18 DIAGNOSIS — Z1231 Encounter for screening mammogram for malignant neoplasm of breast: Secondary | ICD-10-CM | POA: Insufficient documentation

## 2019-07-18 DIAGNOSIS — Z1239 Encounter for other screening for malignant neoplasm of breast: Secondary | ICD-10-CM | POA: Diagnosis present

## 2019-08-10 ENCOUNTER — Ambulatory Visit
Admission: EM | Admit: 2019-08-10 | Discharge: 2019-08-10 | Disposition: A | Discharge status: Home / Self Care | Payer: 59 | Attending: Family Medicine | Admitting: Family Medicine

## 2019-08-10 ENCOUNTER — Ambulatory Visit (INDEPENDENT_AMBULATORY_CARE_PROVIDER_SITE_OTHER): Payer: 59

## 2019-08-10 ENCOUNTER — Encounter: Payer: Self-pay | Admitting: Emergency Medicine

## 2019-08-10 ENCOUNTER — Other Ambulatory Visit: Payer: Self-pay

## 2019-08-10 DIAGNOSIS — M25461 Effusion, right knee: Secondary | ICD-10-CM

## 2019-08-10 DIAGNOSIS — M25561 Pain in right knee: Secondary | ICD-10-CM

## 2019-08-10 NOTE — ED Triage Notes (Signed)
Patient states that she was cleaning her patio yesterday and popped her right knee out twice. Patient c/o pain in the front and back of her right knee.

## 2019-08-10 NOTE — Discharge Instructions (Signed)
Rest. Ice. Knee immobilizer for 3-5 days. Gradual increase activity as tolerated.   Follow up with your primary care physician or orthopedic this week as needed. Return to Urgent care for new or worsening concerns.

## 2019-08-10 NOTE — ED Provider Notes (Signed)
[facility] ____________________________________________  Time seen: Approximately 11:17 AM  I have reviewed the triage vital signs and the nursing notes.   HISTORY  Chief Complaint Knee Pain (right)  HPI Dawn Hamilton is a 43 y.o. female with previous knee surgeries and history of subluxation presenting for evaluation of right knee pain since yesterday.  Patient reports she was walking on her patio and felt her knee start to go out of place in which she tried to adjust it.  States this happened twice yesterday with increasing accompanying pain.  Denies other injuries.  Reports she has had some swelling since.  Pain to the front and back of knee.  Denies pain radiation or paresthesias.  States she is able to flex her knee to approximately 60 degrees and able to mostly fully extend.  States history of similar.  States she no longer has a knee immobilizer at home.  Did take 800 mg of ibuprofen.  [facility] followed with [facility].  Reports otherwise doing well.  Denies recent cough, chest pain, shortness of breath or fevers.   Patient's last menstrual period was 08/05/2019 (exact date).  Denies pregnancy.   Past Medical History:  Diagnosis Date  . Allergy   . Anxiety   . Asthma    exercise induced  . Depression   . Precancerous skin lesion     Patient Active Problem List   Diagnosis Date Noted  . Plant irritant contact dermatitis 06/17/2019  . Iron deficiency anemia 12/09/2018  . Preventative health care 12/04/2016  . Seasonal allergic rhinitis 11/27/2014    History reviewed. No pertinent surgical history.   No current facility-administered medications for this encounter.   Current Outpatient Medications:  .  Ferrous Sulfate (IRON) 325 (65 Fe) MG TABS, 2 (two) times daily. , Disp: , Rfl:  .  loratadine (CLARITIN) 10 MG tablet, Take 10 mg by mouth daily as needed for allergies., Disp: , Rfl:  .  Multiple Vitamins-Minerals (MULTIVITAMIN  ADULT) TABS, , Disp: , Rfl:   Allergies Codeine, Wound dressing adhesive, Benzoin, and Iodine  Family History  Problem Relation Age of Onset  . Colon cancer Maternal Grandfather   . Diabetes Maternal Grandfather   . Bladder Cancer Paternal Grandmother   . Healthy Mother   . Healthy Father     Social History Social History   Tobacco Use  . Smoking status: Never Smoker  . Smokeless tobacco: Never Used  Substance Use Topics  . Alcohol use: Yes  . Drug use: Never    Review of Systems Constitutional: No fever ENT: No sore throat. Cardiovascular: Denies chest pain. Musculoskeletal: Positive right knee pain. Skin: Negative for rash.   ____________________________________________   PHYSICAL EXAM:  VITAL SIGNS: ED Triage Vitals  Enc Vitals Group     BP 08/10/19 1015 127/90     Pulse Rate 08/10/19 1015 70     Resp 08/10/19 1015 14     Temp 08/10/19 1015 98.2 F (36.8 C)     Temp Source 08/10/19 1015 Oral     SpO2 08/10/19 1015 98 %     Weight 08/10/19 1011 165 lb (74.8 kg)     Height 08/10/19 1011 5\' 8"  (1.727 m)     Head Circumference --      Peak Flow --      Pain Score 08/10/19 1010 7     Pain Loc --      Pain Edu? --      Excl. in [place]? --  Constitutional: Alert and oriented. Well appearing and in no acute distress. Eyes: Conjunctivae are normal. ENT      Head: Normocephalic and atraumatic. Cardiovascular: Good peripheral circulation. Respiratory: Normal respiratory effort without tachypnea nor retractions Musculoskeletal: Ambulatory with antalgic gait.  Bilateral pedal pulses equal and easily palpated.   Except: Right knee surgical scars present with diffuse tenderness, tenderness more focal to anterior medial and lateral knee with lateral knee effusion, able to fully extend, unable to fully flex knee, able to flex past 45 degrees but not to 90, no laxity noted with anterior posterior or medial lateral stress.  Right leg otherwise nontender. Neurologic:   Normal speech and language.  Skin:  Skin is warm, dry and intact. No rash noted. Psychiatric: Mood and affect are normal. Speech and behavior are normal. Patient exhibits appropriate insight and judgment   ___________________________________________   LABS (all labs ordered are listed, but only abnormal results are displayed)  Labs Reviewed - No data to display  RADIOLOGY  Dg Knee Complete 4 Views Right  Result Date: 08/10/2019 CLINICAL DATA:  Patient reports right knee dislocation yesterday. Reports hx of right knee dislocation. Patient reports typically knee will go back to normal and she will be pain free within a couple of hours. Reports this time, pain has persist.*comment was truncated*Right knee pain due to possible dislocation of right knee yesterday EXAM: RIGHT KNEE - COMPLETE 4+ VIEW COMPARISON:  None. FINDINGS: No fracture of the proximal tibia or distal femur. Patella is normal. Small suprapatellar joint effusion. Ligament anchors in the tibia. IMPRESSION: Small joint effusion.  No fracture or dislocation Electronically Signed   By: Suzy Bouchard M.D.   On: 08/10/2019 10:50   ____________________________________________   PROCEDURES Procedures   INITIAL IMPRESSION / ASSESSMENT AND PLAN / ED COURSE  Pertinent labs & imaging results that were available during my care of the patient were reviewed by me and considered in my medical decision making (see chart for details).  Well-appearing patient.  No acute distress.  Right knee x-ray as above per radiologist, no fracture dislocation, small joint effusion.  Suspect right knee subluxation with continued pain.  Right knee immobilizer given.  Patient states she will continue taking ibuprofen at home and declined need for further prescription.  Work note given for tomorrow.  Patient previously under rehab, directed to continue stretching and supportive care as able.  Ice.  Elevate.  Knee immobilizer for 3 to 5 days and then gradual  increase activity.  Follow-up with orthopedic.  Discussed follow up with Primary care physician this week as needed. Discussed follow up and return parameters including no resolution or any worsening concerns. Patient verbalized understanding and agreed to plan.   ____________________________________________   FINAL CLINICAL IMPRESSION(S) / ED DIAGNOSES  Final diagnoses:  Acute pain of right knee  Effusion of right knee     ED Discharge Orders    None       Note: This dictation was prepared with Dragon dictation along with smaller phrase technology. Any transcriptional errors that result from this process are unintentional.         Marylene Land, NP 08/10/19 1131

## 2019-08-12 DIAGNOSIS — S83004A Unspecified dislocation of right patella, initial encounter: Secondary | ICD-10-CM | POA: Diagnosis not present

## 2019-08-14 ENCOUNTER — Other Ambulatory Visit: Payer: Self-pay | Admitting: Student

## 2019-08-14 DIAGNOSIS — S83004A Unspecified dislocation of right patella, initial encounter: Secondary | ICD-10-CM

## 2019-08-26 ENCOUNTER — Ambulatory Visit
Admission: RE | Admit: 2019-08-26 | Discharge: 2019-08-26 | Disposition: A | Discharge status: Home / Self Care | Payer: 59 | Source: Ambulatory Visit | Attending: Student | Admitting: Student

## 2019-08-26 ENCOUNTER — Other Ambulatory Visit: Payer: Self-pay

## 2019-08-26 DIAGNOSIS — M23303 Other meniscus derangements, unspecified medial meniscus, right knee: Secondary | ICD-10-CM | POA: Diagnosis not present

## 2019-08-26 DIAGNOSIS — S83004A Unspecified dislocation of right patella, initial encounter: Secondary | ICD-10-CM | POA: Diagnosis not present

## 2019-08-29 DIAGNOSIS — D2261 Melanocytic nevi of right upper limb, including shoulder: Secondary | ICD-10-CM | POA: Diagnosis not present

## 2019-08-29 DIAGNOSIS — D2262 Melanocytic nevi of left upper limb, including shoulder: Secondary | ICD-10-CM | POA: Diagnosis not present

## 2019-08-29 DIAGNOSIS — D2272 Melanocytic nevi of left lower limb, including hip: Secondary | ICD-10-CM | POA: Diagnosis not present

## 2019-08-29 DIAGNOSIS — D225 Melanocytic nevi of trunk: Secondary | ICD-10-CM | POA: Diagnosis not present

## 2019-08-29 DIAGNOSIS — D2271 Melanocytic nevi of right lower limb, including hip: Secondary | ICD-10-CM | POA: Diagnosis not present

## 2019-08-29 DIAGNOSIS — B078 Other viral warts: Secondary | ICD-10-CM | POA: Diagnosis not present

## 2019-09-09 DIAGNOSIS — M25561 Pain in right knee: Secondary | ICD-10-CM | POA: Diagnosis not present

## 2019-09-14 NOTE — Progress Notes (Signed)
[place]  Telephone:([phone] [phone]  HEMATOLOGY-ONCOLOGY TELEMEDICINE VISIT PROGRESS NOTE  ID: Dawn Hamilton OB: 11-03-1976  MR#: [id number]  [id number]  Patient Care Team: [name], NP as PCP - General (Internal Medicine)   CHIEF COMPLAINT:  Iron deficiency anemia  INTERVAL HISTORY: Patient returns to clinic today for repeat laboratory work and further evaluation.  She is having chronic right knee pain and require surgery in the next couple weeks, but otherwise feels well. She has no neurologic complaints.  She denies any recent fevers or illnesses.  She has a good appetite and denies weight loss.  She has no chest pain, shortness of breath, cough, or hemoptysis.  She has no nausea, vomiting, constipation, or diarrhea.  She has no melena or hematochezia.  She has no urinary complaints.  Patient offers no specific complaints today.  REVIEW OF SYSTEMS:   Review of Systems  Constitutional: Negative.  Negative for fever, malaise/fatigue and weight loss.  Respiratory: Negative.  Negative for cough, hemoptysis and shortness of breath.   Cardiovascular: Negative.  Negative for chest pain and leg swelling.  Gastrointestinal: Negative.  Negative for abdominal pain, blood in stool and melena.  Genitourinary: Negative.  Negative for hematuria.  Musculoskeletal: Negative.  Negative for back pain.  Skin: Negative.  Negative for rash.  Neurological: Negative.  Negative for dizziness, focal weakness, weakness and headaches.  Psychiatric/Behavioral: Negative.  The patient is not nervous/anxious.     As per HPI. Otherwise, a complete review of systems is negative.  PAST MEDICAL HISTORY: Past Medical History:  Diagnosis Date   Allergy    Anxiety    Asthma    exercise induced   Depression    Precancerous skin lesion     PAST SURGICAL HISTORY: No past surgical history on file.  FAMILY HISTORY: Family History  Problem  Relation Age of Onset   Colon cancer Maternal Grandfather    Diabetes Maternal Grandfather    Bladder Cancer Paternal Grandmother    Healthy Mother    Healthy Father     ADVANCED DIRECTIVES (Y/N):  N  HEALTH MAINTENANCE: Social History   Tobacco Use   Smoking status: Never Smoker   Smokeless tobacco: Never Used  Substance Use Topics   Alcohol use: Yes   Drug use: Never     Colonoscopy:  PAP:  Bone density:  Lipid panel:  Allergies  Allergen Reactions   Codeine Nausea Only   Wound Dressing Adhesive    Benzoin Rash   Iodine Rash    Current Outpatient Medications  Medication Sig Dispense Refill   Ferrous Sulfate (IRON) 325 (65 Fe) MG TABS 2 (two) times daily.      loratadine (CLARITIN) 10 MG tablet Take 10 mg by mouth daily as needed for allergies.     Multiple Vitamins-Minerals (MULTIVITAMIN ADULT) TABS      No current facility-administered medications for this visit.     OBJECTIVE: Vitals:   09/19/19 1312  BP: (!) 128/92  Pulse: 89  Resp: 16  Temp: (!) 97.5 F (36.4 C)  SpO2: 99%     Body mass index is 25.85 kg/m.    ECOG FS:0 - Asymptomatic  General: Well-developed, well-nourished, no acute distress. Eyes: Pink conjunctiva, anicteric sclera. HEENT: Normocephalic, moist mucous membranes. Lungs: Clear to auscultation bilaterally. Heart: Regular rate and rhythm. No rubs, murmurs, or gallops. Abdomen: Soft, nontender, nondistended. No organomegaly noted, normoactive bowel sounds. Musculoskeletal: No edema, cyanosis, or clubbing. Neuro: Alert, answering all questions appropriately.  Cranial nerves grossly intact. Skin: No rashes or petechiae noted. Psych: Normal affect.  LAB RESULTS:  Lab Results  Component Value Date   NA 138 12/04/2018   K 4.8 12/04/2018   CL 103 12/04/2018   CO2 29 12/04/2018   GLUCOSE 102 (H) 12/04/2018   BUN 10 12/04/2018   CREATININE 0.87 12/04/2018   CALCIUM 9.7 12/04/2018   PROT 6.7 12/04/2018   ALBUMIN  4.3 12/04/2018   AST 18 12/04/2018   ALT 13 12/04/2018   ALKPHOS 69 12/04/2018   BILITOT 0.3 12/04/2018    Lab Results  Component Value Date   WBC 7.0 09/17/2019   NEUTROABS 3.7 09/17/2019   HGB 15.6 (H) 09/17/2019   HCT 45.5 09/17/2019   MCV 86.2 09/17/2019   PLT 348 09/17/2019   Lab Results  Component Value Date   IRON 119 09/17/2019   TIBC 352 09/17/2019   IRONPCTSAT 34 (H) 09/17/2019   Lab Results  Component Value Date   FERRITIN 26 09/17/2019     STUDIES: Mr Knee Right Wo Contrast  Result Date: 08/26/2019 CLINICAL DATA:  Acute knee pain since transient patellar dislocation 2 weeks ago. EXAM: MRI OF THE RIGHT KNEE WITHOUT CONTRAST TECHNIQUE: Multiplanar, multisequence MR imaging of the knee was performed. No intravenous contrast was administered. COMPARISON:  Right knee x-rays dated August 10, 2019. FINDINGS: MENISCI Medial meniscus:  Intact. Lateral meniscus:  Intact. LIGAMENTS Cruciates:  Intact ACL and PCL. Collaterals: Medial collateral ligament is intact. Lateral collateral ligament complex is intact. CARTILAGE Patellofemoral: Full-thickness cartilage delamination over the medial patellar facet. Medial:  Normal. Lateral:  Normal. Joint:  Small joint effusion.  Normal Hoffa's fat. Popliteal Fossa:  Small Baker cyst.  Intact popliteus tendon. Extensor Mechanism: Intact quadriceps tendon and patellar tendon. Partial tear of the medial patellar retinaculum. The MPFL appears intact. Intact lateral patellar retinaculum. Bones: Contusion in the inferomedial patella. Impaction fracture of the peripheral lateral femoral condyle. Slight lateral subluxation of the patella. No dislocation. No suspicious bone lesion. Prior tibial tuberosity osteotomy. Congenitally shallow trochlear groove. Other: Mild soft tissue swelling about the knee. IMPRESSION: 1. Sequelae of recent transient lateral patellar dislocation with impaction fracture of the peripheral lateral femoral condyle and  contusion in the inferomedial patella. 2. Partial tear of the medial patellar retinaculum. The MPFL appears intact. 3. Full-thickness cartilage delamination over the medial patellar facet. Electronically Signed   By: Titus Dubin M.D.   On: 08/26/2019 15:22    ASSESSMENT: Iron deficiency anemia  PLAN:    1.  Iron deficiency anemia: Resolved.  Previously, patient's iron stores and hemoglobin does not respond to oral iron supplementation.  She does not require additional IV iron today.  Patient last received treatment on June 13, 2019.  Return to clinic in 4 months with repeat laboratory work and further evaluation. 2.  Knee pain: MRI results as above.  Patient is undergoing surgery in approximately 2 weeks.  I spent a total of 15 minutes face-to-face with the patient of which greater than 50% of the visit was spent in counseling and coordination of care as detailed above.       Lloyd Huger, MD 09/19/2019 1:57 PM

## 2019-09-15 ENCOUNTER — Other Ambulatory Visit: Payer: Self-pay | Admitting: Oncology

## 2019-09-17 ENCOUNTER — Other Ambulatory Visit: Payer: Self-pay

## 2019-09-17 ENCOUNTER — Inpatient Hospital Stay: Payer: 59 | Attending: Oncology

## 2019-09-17 DIAGNOSIS — D509 Iron deficiency anemia, unspecified: Secondary | ICD-10-CM | POA: Insufficient documentation

## 2019-09-17 DIAGNOSIS — M25561 Pain in right knee: Secondary | ICD-10-CM | POA: Insufficient documentation

## 2019-09-17 DIAGNOSIS — Z8052 Family history of malignant neoplasm of bladder: Secondary | ICD-10-CM | POA: Insufficient documentation

## 2019-09-17 DIAGNOSIS — Z79899 Other long term (current) drug therapy: Secondary | ICD-10-CM | POA: Diagnosis not present

## 2019-09-17 DIAGNOSIS — Z800 Family history of malignant neoplasm of digestive organs: Secondary | ICD-10-CM | POA: Diagnosis not present

## 2019-09-17 DIAGNOSIS — G8929 Other chronic pain: Secondary | ICD-10-CM | POA: Diagnosis not present

## 2019-09-17 LAB — IRON AND TIBC
Iron: 119 ug/dL (ref 28–170)
Saturation Ratios: 34 % — ABNORMAL HIGH (ref 10.4–31.8)
TIBC: 352 ug/dL (ref 250–450)
UIBC: 233 ug/dL

## 2019-09-17 LAB — CBC WITH DIFFERENTIAL/PLATELET
Abs Immature Granulocytes: 0.02 10*3/uL (ref 0.00–0.07)
Basophils Absolute: 0.1 10*3/uL (ref 0.0–0.1)
Basophils Relative: 1 %
Eosinophils Absolute: 0.1 10*3/uL (ref 0.0–0.5)
Eosinophils Relative: 2 %
HCT: 45.5 % (ref 36.0–46.0)
Hemoglobin: 15.6 g/dL — ABNORMAL HIGH (ref 12.0–15.0)
Immature Granulocytes: 0 %
Lymphocytes Relative: 37 %
Lymphs Abs: 2.6 10*3/uL (ref 0.7–4.0)
MCH: 29.5 pg (ref 26.0–34.0)
MCHC: 34.3 g/dL (ref 30.0–36.0)
MCV: 86.2 fL (ref 80.0–100.0)
Monocytes Absolute: 0.6 10*3/uL (ref 0.1–1.0)
Monocytes Relative: 9 %
Neutro Abs: 3.7 10*3/uL (ref 1.7–7.7)
Neutrophils Relative %: 51 %
Platelets: 348 10*3/uL (ref 150–400)
RBC: 5.28 MIL/uL — ABNORMAL HIGH (ref 3.87–5.11)
RDW: 13.2 % (ref 11.5–15.5)
WBC: 7 10*3/uL (ref 4.0–10.5)
nRBC: 0 % (ref 0.0–0.2)

## 2019-09-17 LAB — FOLATE: Folate: 15.2 ng/mL (ref >5.9)

## 2019-09-17 LAB — FERRITIN: Ferritin: 26 ng/mL (ref 11–307)

## 2019-09-18 ENCOUNTER — Other Ambulatory Visit: Payer: 59

## 2019-09-19 ENCOUNTER — Inpatient Hospital Stay (HOSPITAL_BASED_OUTPATIENT_CLINIC_OR_DEPARTMENT_OTHER): Payer: 59 | Admitting: Oncology

## 2019-09-19 ENCOUNTER — Other Ambulatory Visit: Payer: Self-pay

## 2019-09-19 ENCOUNTER — Inpatient Hospital Stay: Payer: 59

## 2019-09-19 VITALS — BP 128/92 | HR 89 | Temp 97.5°F | Resp 16 | Wt 170.0 lb

## 2019-09-19 DIAGNOSIS — Z8052 Family history of malignant neoplasm of bladder: Secondary | ICD-10-CM | POA: Diagnosis not present

## 2019-09-19 DIAGNOSIS — M25561 Pain in right knee: Secondary | ICD-10-CM | POA: Diagnosis not present

## 2019-09-19 DIAGNOSIS — D509 Iron deficiency anemia, unspecified: Secondary | ICD-10-CM

## 2019-09-19 DIAGNOSIS — G8929 Other chronic pain: Secondary | ICD-10-CM | POA: Diagnosis not present

## 2019-09-19 DIAGNOSIS — Z79899 Other long term (current) drug therapy: Secondary | ICD-10-CM | POA: Diagnosis not present

## 2019-09-19 DIAGNOSIS — Z800 Family history of malignant neoplasm of digestive organs: Secondary | ICD-10-CM | POA: Diagnosis not present

## 2019-09-21 ENCOUNTER — Encounter: Payer: Self-pay | Admitting: Oncology

## 2019-09-25 ENCOUNTER — Other Ambulatory Visit: Payer: Self-pay

## 2019-09-25 ENCOUNTER — Encounter (HOSPITAL_BASED_OUTPATIENT_CLINIC_OR_DEPARTMENT_OTHER): Payer: Self-pay | Admitting: *Deleted

## 2019-09-29 ENCOUNTER — Other Ambulatory Visit
Admission: RE | Admit: 2019-09-29 | Discharge: 2019-09-29 | Disposition: A | Discharge status: Home / Self Care | Payer: 59 | Source: Ambulatory Visit | Attending: Orthopaedic Surgery | Admitting: Orthopaedic Surgery

## 2019-09-29 ENCOUNTER — Other Ambulatory Visit: Payer: Self-pay

## 2019-09-29 DIAGNOSIS — Z01812 Encounter for preprocedural laboratory examination: Secondary | ICD-10-CM | POA: Insufficient documentation

## 2019-09-29 DIAGNOSIS — Z20828 Contact with and (suspected) exposure to other viral communicable diseases: Secondary | ICD-10-CM | POA: Insufficient documentation

## 2019-09-30 LAB — SARS CORONAVIRUS 2 (TAT 6-24 HRS): SARS Coronavirus 2: NEGATIVE

## 2019-10-01 NOTE — H&P (Signed)
PREOPERATIVE H&P  Chief Complaint: RIGHT KNEE CHONDROMALACIA PATELLAE  HPI: Dawn Hamilton is a 43 y.o. female who presents for preoperative history and physical prior to scheduled surgery, RIGHT KNEE ARTHROSCOPY WITH EXTRA ARTICULAR LIGAMENT RECONSTRUCTION.   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  In 1994 she had surgery for tibial tubercle transfer and did reasonably with that.  She had continued instability and in 2004 had a debridement and medial retinacular imbrication and some mild medial patellofemoral changes at that point.  Unfortunately she has continued instability and had a true frank dislocation event and has had primarily instability since.  She has been in a brace, tried physical therapy and hasn't made much progress.  She had no infection after her previous surgery.  She is very active and participates in tough mudder races, as well as other high demand physical activities with her young children.    Her symptoms are rated as moderate to severe, and have been worsening.  This is significantly impairing activities of daily living.    Please see clinic note for further details on this patient's care.    She has elected for surgical management.   Past Medical History:  Diagnosis Date  . Allergy   . Anxiety   . Depression   . Precancerous skin lesion    History reviewed. No pertinent surgical history. Social History   Socioeconomic History  . Marital status: Married    Spouse name: Not on file  . Number of children: Not on file  . Years of education: Not on file  . Highest education level: Not on file  Occupational History  . Not on file  Social Needs  . Financial resource strain: Not on file  . Food insecurity    Worry: Not on file    Inability: Not on file  . Transportation needs    Medical: Not on file   Non-medical: Not on file  Tobacco Use  . Smoking status: Never Smoker  . Smokeless tobacco: Never Used  Substance and Sexual Activity  . Alcohol use: Yes  . Drug use: Never  . Sexual activity: Not on file  Lifestyle  . Physical activity    Days per week: Not on file    Minutes per session: Not on file  . Stress: Not on file  Relationships  . Social Herbalist on phone: Not on file    Gets together: Not on file    Attends religious service: Not on file    Active member of club or organization: Not on file    Attends meetings of clubs or organizations: Not on file    Relationship status: Not on file  Other Topics Concern  . Not on file  Social History Narrative   Married.   2 children.   Works as a Psychologist, forensic.   Enjoys spending time outdoors, going to the lake.   Family History  Problem Relation Age of Onset  . Colon cancer Maternal Grandfather   . Diabetes Maternal Grandfather   . Bladder Cancer Paternal Grandmother   . Healthy Mother   . Healthy Father    Allergies  Allergen Reactions  . Codeine Nausea Only  . Wound Dressing Adhesive   . Benzoin Rash  . Iodine Rash   Prior to Admission medications   Medication Sig Start Date End Date  Taking? Authorizing Provider  Ferrous Sulfate (IRON) 325 (65 Fe) MG TABS 2 (two) times daily.  12/11/18  Yes [provider]  loratadine (CLARITIN) 10 MG tablet Take 10 mg by mouth daily as needed for allergies.   Yes [provider]  Multiple Vitamins-Minerals (MULTIVITAMIN ADULT) TABS    Yes [provider]    ROS: All other systems have been reviewed and were otherwise negative with the exception of those mentioned in the HPI and as above.  Physical Exam: General: Alert, no acute distress Cardiovascular: No pedal edema Respiratory: No cyanosis, no use of accessory musculature GI: No organomegaly, abdomen is soft and non-tender Skin: No lesions in the area of chief complaint Neurologic:  Sensation intact distally Psychiatric: Patient is competent for consent with normal mood and affect Lymphatic: No axillary or cervical lymphadenopathy  MUSCULOSKELETAL:  Right knee: range of motion 0-110 degrees, limited by patient pain.  Quads are 4/5 compared to the contralateral side.  She has exquisite apprehension with lateral transition of the patella.  She has a well healed medial small peripatellar incision from the retinacular repair and a large incision from her tibia tubercle osteotomy, all of which are healed well.  She has no J sign.  Imaging: 1. X-rays demonstrate healed osteotomy.  No other acute osseous abnormalities.  There are some mild patellofemoral changes.   2. MRI demonstrates medial cartilage wear on the medial facet of the patella.  The trochlea looks to be reasonably intact.  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  Insufficient MPFL and medial cartilage damage  Plan: Plan for Procedure(s): RIGHT KNEE ARTHROSCOPY WITH EXTRA ARTICULAR LIGAMENT RECONSTRUCTION  We talked with Ms. Clum about her knee at length, including the fact that she likely has an unconstrained lesion of the medial patella facet and unlike her other opinions we feel that it may not be best served with a chondral procedure.  Patient understands that this would leave her with continued cartilage damage, however, her primary complaint is instability and not pain.  She understands that she may need further procedures in the future which in this case would likely lean towards patellofemoral arthroplasty type options.  We talked about the risks, benefits and alternatives of MPFL reconstruction with allograft and debridement of her patellofemoral cartilage taking assessment at the time.  She understands that procedure.    The risks benefits and  alternatives were discussed with the patient including but not limited to the risks of nonoperative treatment, versus surgical intervention including infection, bleeding, nerve injury,  blood clots, cardiopulmonary complications, morbidity, mortality, among others, and they were willing to proceed.   The patient acknowledged the explanation, agreed to proceed with the plan and consent was signed.   Operative Plan: Right knee: MPFL reconstruction with allograft and debridement of her patellofemoral cartilage Discharge Medications: Standard  DVT Prophylaxis: Aspirin Physical Therapy: Outpatient PT Special Discharge needs: Knee immobilizer   Ethelda Chick, PA-C  10/01/2019 5:27 PM

## 2019-10-02 ENCOUNTER — Ambulatory Visit (HOSPITAL_BASED_OUTPATIENT_CLINIC_OR_DEPARTMENT_OTHER): Payer: 59 | Admitting: Anesthesiology

## 2019-10-02 ENCOUNTER — Ambulatory Visit (HOSPITAL_BASED_OUTPATIENT_CLINIC_OR_DEPARTMENT_OTHER)
Admission: RE | Admit: 2019-10-02 | Discharge: 2019-10-02 | Disposition: A | Discharge status: Home / Self Care | Payer: 59 | Attending: Orthopaedic Surgery | Admitting: Orthopaedic Surgery

## 2019-10-02 ENCOUNTER — Encounter (HOSPITAL_BASED_OUTPATIENT_CLINIC_OR_DEPARTMENT_OTHER): Payer: Self-pay | Admitting: Anesthesiology

## 2019-10-02 ENCOUNTER — Ambulatory Visit (HOSPITAL_COMMUNITY): Payer: 59

## 2019-10-02 ENCOUNTER — Encounter (HOSPITAL_BASED_OUTPATIENT_CLINIC_OR_DEPARTMENT_OTHER)
Admission: RE | Disposition: A | Discharge status: Home / Self Care | Payer: Self-pay | Source: Home / Self Care | Attending: Orthopaedic Surgery

## 2019-10-02 ENCOUNTER — Other Ambulatory Visit: Payer: Self-pay

## 2019-10-02 DIAGNOSIS — Z79899 Other long term (current) drug therapy: Secondary | ICD-10-CM | POA: Insufficient documentation

## 2019-10-02 DIAGNOSIS — M2241 Chondromalacia patellae, right knee: Secondary | ICD-10-CM | POA: Diagnosis not present

## 2019-10-02 DIAGNOSIS — Z888 Allergy status to other drugs, medicaments and biological substances status: Secondary | ICD-10-CM | POA: Diagnosis not present

## 2019-10-02 DIAGNOSIS — J302 Other seasonal allergic rhinitis: Secondary | ICD-10-CM | POA: Diagnosis not present

## 2019-10-02 DIAGNOSIS — Z885 Allergy status to narcotic agent status: Secondary | ICD-10-CM | POA: Insufficient documentation

## 2019-10-02 DIAGNOSIS — F329 Major depressive disorder, single episode, unspecified: Secondary | ICD-10-CM | POA: Insufficient documentation

## 2019-10-02 DIAGNOSIS — F418 Other specified anxiety disorders: Secondary | ICD-10-CM | POA: Diagnosis not present

## 2019-10-02 DIAGNOSIS — Z4789 Encounter for other orthopedic aftercare: Secondary | ICD-10-CM

## 2019-10-02 DIAGNOSIS — F419 Anxiety disorder, unspecified: Secondary | ICD-10-CM | POA: Diagnosis not present

## 2019-10-02 DIAGNOSIS — G8918 Other acute postprocedural pain: Secondary | ICD-10-CM | POA: Diagnosis not present

## 2019-10-02 DIAGNOSIS — D509 Iron deficiency anemia, unspecified: Secondary | ICD-10-CM | POA: Diagnosis not present

## 2019-10-02 DIAGNOSIS — Z472 Encounter for removal of internal fixation device: Secondary | ICD-10-CM | POA: Diagnosis not present

## 2019-10-02 DIAGNOSIS — M2351 Chronic instability of knee, right knee: Secondary | ICD-10-CM | POA: Diagnosis not present

## 2019-10-02 HISTORY — PX: KNEE ARTHROSCOPY WITH MEDIAL PATELLAR FEMORAL LIGAMENT RECONSTRUCTION: SHX5652

## 2019-10-02 LAB — POCT PREGNANCY, URINE: Preg Test, Ur: NEGATIVE

## 2019-10-02 SURGERY — REPAIR, TENDON, PATELLAR, ARTHROSCOPIC
Anesthesia: Regional | Site: Knee | Laterality: Right

## 2019-10-02 MED ORDER — PROPOFOL 10 MG/ML IV BOLUS
INTRAVENOUS | Status: DC | PRN
  Administered 2019-10-02: 200 mg via INTRAVENOUS

## 2019-10-02 MED ORDER — CEFAZOLIN SODIUM-DEXTROSE 2-4 GM/100ML-% IV SOLN
2.0000 g | INTRAVENOUS | Status: AC
  Administered 2019-10-02: 11:00:00 2 g via INTRAVENOUS

## 2019-10-02 MED ORDER — MIDAZOLAM HCL 2 MG/2ML IJ SOLN
INTRAMUSCULAR | Status: AC
  Filled 2019-10-02: qty 2

## 2019-10-02 MED ORDER — FENTANYL CITRATE (PF) 100 MCG/2ML IJ SOLN
INTRAMUSCULAR | Status: AC
  Filled 2019-10-02: qty 2

## 2019-10-02 MED ORDER — OXYCODONE HCL 5 MG PO TABS: ORAL_TABLET | ORAL | 0 refills | Status: AC

## 2019-10-02 MED ORDER — DEXAMETHASONE SODIUM PHOSPHATE 4 MG/ML IJ SOLN
INTRAMUSCULAR | Status: DC | PRN
  Administered 2019-10-02: 10 mg via INTRAVENOUS

## 2019-10-02 MED ORDER — LIDOCAINE 2% (20 MG/ML) 5 ML SYRINGE
INTRAMUSCULAR | Status: DC | PRN
  Administered 2019-10-02: 40 mg via INTRAVENOUS

## 2019-10-02 MED ORDER — ACETAMINOPHEN 500 MG PO TABS
1000.0000 mg | ORAL_TABLET | Freq: Once | ORAL | Status: AC
  Administered 2019-10-02: 1000 mg via ORAL

## 2019-10-02 MED ORDER — DEXAMETHASONE SODIUM PHOSPHATE 10 MG/ML IJ SOLN
INTRAMUSCULAR | Status: DC | PRN
  Administered 2019-10-02: 5 mg

## 2019-10-02 MED ORDER — FENTANYL CITRATE (PF) 100 MCG/2ML IJ SOLN
50.0000 ug | INTRAMUSCULAR | Status: DC | PRN
  Administered 2019-10-02: 50 ug via INTRAVENOUS

## 2019-10-02 MED ORDER — OXYCODONE HCL 5 MG PO TABS
ORAL_TABLET | ORAL | Status: AC
  Filled 2019-10-02: qty 1

## 2019-10-02 MED ORDER — ACETAMINOPHEN 500 MG PO TABS
ORAL_TABLET | ORAL | Status: AC
  Filled 2019-10-02: qty 2

## 2019-10-02 MED ORDER — FENTANYL CITRATE (PF) 100 MCG/2ML IJ SOLN
25.0000 ug | INTRAMUSCULAR | Status: DC | PRN
  Administered 2019-10-02 (×2): 50 ug via INTRAVENOUS

## 2019-10-02 MED ORDER — ROPIVACAINE HCL 5 MG/ML IJ SOLN
INTRAMUSCULAR | Status: DC | PRN
  Administered 2019-10-02: 20 mL via PERINEURAL

## 2019-10-02 MED ORDER — OXYCODONE HCL 5 MG PO TABS
5.0000 mg | ORAL_TABLET | ORAL | Status: DC | PRN
  Administered 2019-10-02: 5 mg via ORAL

## 2019-10-02 MED ORDER — ACETAMINOPHEN 500 MG PO TABS: 1000.0000 mg | ORAL_TABLET | Freq: Three times a day (TID) | ORAL | 0 refills | Status: AC

## 2019-10-02 MED ORDER — DEXAMETHASONE SODIUM PHOSPHATE 10 MG/ML IJ SOLN
INTRAMUSCULAR | Status: AC
  Filled 2019-10-02: qty 1

## 2019-10-02 MED ORDER — CEFAZOLIN SODIUM-DEXTROSE 2-4 GM/100ML-% IV SOLN
INTRAVENOUS | Status: AC
  Filled 2019-10-02: qty 100

## 2019-10-02 MED ORDER — LIDOCAINE 2% (20 MG/ML) 5 ML SYRINGE
INTRAMUSCULAR | Status: AC
  Filled 2019-10-02: qty 5

## 2019-10-02 MED ORDER — SODIUM CHLORIDE 0.9 % IR SOLN
Status: DC | PRN
  Administered 2019-10-02: 3000 mL

## 2019-10-02 MED ORDER — ONDANSETRON HCL 4 MG/2ML IJ SOLN
INTRAMUSCULAR | Status: DC | PRN
  Administered 2019-10-02: 4 mg via INTRAVENOUS

## 2019-10-02 MED ORDER — ONDANSETRON HCL 4 MG/2ML IJ SOLN
INTRAMUSCULAR | Status: AC
  Filled 2019-10-02: qty 2

## 2019-10-02 MED ORDER — ONDANSETRON HCL 4 MG PO TABS: 4.0000 mg | ORAL_TABLET | Freq: Three times a day (TID) | ORAL | 1 refills | Status: AC | PRN

## 2019-10-02 MED ORDER — LACTATED RINGERS IV SOLN
INTRAVENOUS | Status: DC
  Administered 2019-10-02: 10:00:00 via INTRAVENOUS

## 2019-10-02 MED ORDER — MIDAZOLAM HCL 2 MG/2ML IJ SOLN
1.0000 mg | INTRAMUSCULAR | Status: DC | PRN
  Administered 2019-10-02: 11:00:00 2 mg via INTRAVENOUS

## 2019-10-02 MED ORDER — VANCOMYCIN HCL 1000 MG IV SOLR
INTRAVENOUS | Status: AC
  Filled 2019-10-02: qty 1000

## 2019-10-02 MED ORDER — CHLORHEXIDINE GLUCONATE 4 % EX LIQD: 60.0000 mL | Freq: Once | CUTANEOUS | Status: DC

## 2019-10-02 MED ORDER — MELOXICAM 7.5 MG PO TABS: 7.5000 mg | ORAL_TABLET | Freq: Every day | ORAL | 2 refills | Status: DC

## 2019-10-02 MED ORDER — ASPIRIN 81 MG PO CHEW: 81.0000 mg | CHEWABLE_TABLET | Freq: Two times a day (BID) | ORAL | 1 refills | Status: DC

## 2019-10-02 MED ORDER — VANCOMYCIN HCL 1 G IV SOLR
INTRAVENOUS | Status: DC | PRN
  Administered 2019-10-02: 1000 mg via TOPICAL

## 2019-10-02 SURGICAL SUPPLY — 74 items
ANCHOR SUT SWIVELLOK BIO (Anchor) ×4 IMPLANT
ANCHOR SUTURETAK 2.4X12 BIOC # (Anchor) ×6 IMPLANT
BLADE HEX COATED 2.75 (ELECTRODE) ×2 IMPLANT
BLADE SHAVER BONE 5.0X13 (MISCELLANEOUS) IMPLANT
BLADE SURG 10 STRL SS (BLADE) IMPLANT
BLADE SURG 15 STRL LF DISP TIS (BLADE) ×1 IMPLANT
BLADE SURG 15 STRL SS (BLADE) ×1
BNDG COHESIVE 4X5 TAN STRL (GAUZE/BANDAGES/DRESSINGS) ×2 IMPLANT
BNDG ELASTIC 6X5.8 VLCR STR LF (GAUZE/BANDAGES/DRESSINGS) ×2 IMPLANT
BURR OVAL 8 FLU 4.0X13 (MISCELLANEOUS) IMPLANT
CHLORAPREP W/TINT 26 (MISCELLANEOUS) ×2 IMPLANT
CLSR STERI-STRIP ANTIMIC 1/2X4 (GAUZE/BANDAGES/DRESSINGS) ×2 IMPLANT
COVER BACK TABLE REUSABLE LG (DRAPES) ×2 IMPLANT
COVER WAND RF STERILE (DRAPES) IMPLANT
CUFF TOURN SGL QUICK 34 (TOURNIQUET CUFF) ×1
CUFF TRNQT CYL 34X4.125X (TOURNIQUET CUFF) ×1 IMPLANT
DISSECTOR 3.5MM X 13CM CVD (MISCELLANEOUS) ×2 IMPLANT
DISSECTOR 4.0MMX13CM CVD (MISCELLANEOUS) IMPLANT
DRAPE ARTHROSCOPY W/POUCH 90 (DRAPES) ×2 IMPLANT
DRAPE C-ARM 42X72 X-RAY (DRAPES) ×2 IMPLANT
DRAPE C-ARMOR (DRAPES) ×2 IMPLANT
DRAPE HALF SHEET 70X43 (DRAPES) ×2 IMPLANT
DRAPE IMP U-DRAPE 54X76 (DRAPES) ×2 IMPLANT
DRAPE TOP ARMCOVERS (MISCELLANEOUS) ×2 IMPLANT
DRAPE U-SHAPE 47X51 STRL (DRAPES) ×2 IMPLANT
DRSG EMULSION OIL 3X3 NADH (GAUZE/BANDAGES/DRESSINGS) IMPLANT
ELECT REM PT RETURN 9FT ADLT (ELECTROSURGICAL) ×2
ELECTRODE REM PT RTRN 9FT ADLT (ELECTROSURGICAL) ×1 IMPLANT
GAUZE SPONGE 4X4 12PLY STRL (GAUZE/BANDAGES/DRESSINGS) ×4 IMPLANT
GLOVE BIO SURGEON STRL SZ 6.5 (GLOVE) ×4 IMPLANT
GLOVE BIOGEL PI IND STRL 6.5 (GLOVE) ×2 IMPLANT
GLOVE BIOGEL PI IND STRL 7.0 (GLOVE) ×1 IMPLANT
GLOVE BIOGEL PI IND STRL 8 (GLOVE) ×1 IMPLANT
GLOVE BIOGEL PI INDICATOR 6.5 (GLOVE) ×2
GLOVE BIOGEL PI INDICATOR 7.0 (GLOVE) ×1
GLOVE BIOGEL PI INDICATOR 8 (GLOVE) ×1
GLOVE ECLIPSE 8.0 STRL XLNG CF (GLOVE) ×2 IMPLANT
GOWN STRL REUS W/ TWL LRG LVL3 (GOWN DISPOSABLE) ×2 IMPLANT
GOWN STRL REUS W/ TWL XL LVL3 (GOWN DISPOSABLE) ×2 IMPLANT
GOWN STRL REUS W/TWL LRG LVL3 (GOWN DISPOSABLE) ×2
GOWN STRL REUS W/TWL XL LVL3 (GOWN DISPOSABLE) ×4 IMPLANT
IMMOBILIZER KNEE 22 UNIV (SOFTGOODS) IMPLANT
IMMOBILIZER KNEE 24 THIGH 36 (MISCELLANEOUS) IMPLANT
IMMOBILIZER KNEE 24 UNIV (MISCELLANEOUS)
KIT BIO-SUTURETAK 2.4 SPR TROC (KITS) ×2 IMPLANT
KIT SUTURETAK 3 SPEAR TROCAR (KITS) IMPLANT
KIT TRANSTIBIAL (DISPOSABLE) ×2 IMPLANT
KNEE WRAP E Z 3 GEL PACK (MISCELLANEOUS) IMPLANT
MANIFOLD NEPTUNE II (INSTRUMENTS) ×2 IMPLANT
NDL SAFETY ECLIPSE 18X1.5 (NEEDLE) ×1 IMPLANT
NDL SUT 6 .5 CRC .975X.05 MAYO (NEEDLE) IMPLANT
NEEDLE HYPO 18GX1.5 SHARP (NEEDLE) ×1
NEEDLE MAYO TAPER (NEEDLE)
PACK ARTHROSCOPY DSU (CUSTOM PROCEDURE TRAY) ×2 IMPLANT
PACK BASIN DAY SURGERY FS (CUSTOM PROCEDURE TRAY) ×2 IMPLANT
PENCIL SMOKE EVACUATOR (MISCELLANEOUS) ×2 IMPLANT
PORT APPOLLO RF 90DEGREE MULTI (SURGICAL WAND) IMPLANT
SPONGE LAP 4X18 RFD (DISPOSABLE) ×2 IMPLANT
SUT FIBERWIRE #2 38 T-5 BLUE (SUTURE) ×6
SUT MNCRL AB 4-0 PS2 18 (SUTURE) ×2 IMPLANT
SUT VIC AB 0 CT1 27 (SUTURE) ×1
SUT VIC AB 0 CT1 27XBRD ANBCTR (SUTURE) ×1 IMPLANT
SUT VIC AB 3-0 SH 27 (SUTURE) ×1
SUT VIC AB 3-0 SH 27X BRD (SUTURE) ×1 IMPLANT
SUTURE FIBERWR #2 38 T-5 BLUE (SUTURE) ×3 IMPLANT
SUTURE TAPE 1.3 FIBERLOP 20 ST (SUTURE) ×1 IMPLANT
SUTURETAPE 1.3 FIBERLOOP 20 ST (SUTURE) ×2
SYR 5ML LL (SYRINGE) ×2 IMPLANT
TAPE CLOTH 3X10 TAN LF (GAUZE/BANDAGES/DRESSINGS) IMPLANT
TENDON SEMI-TENDINOSUS (Bone Implant) ×2 IMPLANT
TOWEL GREEN STERILE FF (TOWEL DISPOSABLE) ×2 IMPLANT
TUBE SUCTION HIGH CAP CLEAR NV (SUCTIONS) ×2 IMPLANT
TUBING ARTHROSCOPY IRRIG 16FT (MISCELLANEOUS) ×2 IMPLANT
WATER STERILE IRR 1000ML POUR (IV SOLUTION) ×2 IMPLANT

## 2019-10-02 NOTE — Anesthesia Preprocedure Evaluation (Addendum)
Anesthesia Evaluation  Patient identified by MRN, date of birth, ID band Patient awake    Reviewed: Allergy & Precautions, NPO status , Patient's Chart, lab work & pertinent test results  Airway Mallampati: I  TM Distance: >3 FB Neck ROM: Full    Dental no notable dental hx. (+) Teeth Intact, Dental Advisory Given   Pulmonary neg pulmonary ROS,    Pulmonary exam normal breath sounds clear to auscultation       Cardiovascular negative cardio ROS Normal cardiovascular exam Rhythm:Regular Rate:Normal     Neuro/Psych PSYCHIATRIC DISORDERS Anxiety Depression negative neurological ROS     GI/Hepatic negative GI ROS, Neg liver ROS,   Endo/Other  negative endocrine ROS  Renal/GU negative Renal ROS  negative genitourinary   Musculoskeletal negative musculoskeletal ROS (+)   Abdominal   Peds  Hematology negative hematology ROS (+)   Anesthesia Other Findings   Reproductive/Obstetrics                            Anesthesia Physical Anesthesia Plan  ASA: II  Anesthesia Plan: General and Regional   Post-op Pain Management:  Regional for Post-op pain   Induction: Intravenous  PONV Risk Score and Plan: 3 and Ondansetron, Dexamethasone and Midazolam  Airway Management Planned: LMA  Additional Equipment:   Intra-op Plan:   Post-operative Plan: Extubation in OR  Informed Consent: I have reviewed the patients History and Physical, chart, labs and discussed the procedure including the risks, benefits and alternatives for the proposed anesthesia with the patient or authorized representative who has indicated his/her understanding and acceptance.     Dental advisory given  Plan Discussed with: CRNA  Anesthesia Plan Comments:         Anesthesia Quick Evaluation

## 2019-10-02 NOTE — Transfer of Care (Signed)
Immediate Anesthesia Transfer of Care Note  Patient: Dawn Hamilton  Procedure(s) Performed: KNEE ARTHROSCOPY WITH MEDIAL PATELLAR FEMORAL LIGAMENT RECONSTRUCTION WITH ALLOGRAPH (Right Knee)  Patient Location: PACU  Anesthesia Type:General and Regional  Level of Consciousness: awake and sedated  Airway & Oxygen Therapy: Patient Spontanous Breathing and Patient connected to face mask oxygen  Post-op Assessment: Report given to RN and Post -op Vital signs reviewed and stable  Post vital signs: Reviewed and stable  Last Vitals:  Vitals Value Taken Time  BP 139/98 10/02/19 1235  Temp    Pulse 87 10/02/19 1237  Resp 17 10/02/19 1237  SpO2 100 % 10/02/19 1237  Vitals shown include unvalidated device data.  Last Pain:  Vitals:   10/02/19 0957  TempSrc: Oral  PainSc:          Complications: No apparent anesthesia complications

## 2019-10-02 NOTE — Interval H&P Note (Signed)
All questions answered. Patient like to proceed

## 2019-10-02 NOTE — Progress Notes (Signed)
Assisted Dr. Woodrum with right, ultrasound guided, adductor canal block. Side rails up, monitors on throughout procedure. See vital signs in flow sheet. Tolerated Procedure well.  

## 2019-10-02 NOTE — Anesthesia Procedure Notes (Signed)
Procedure Name: LMA Insertion Performed by: Rector Devonshire W, CRNA Pre-anesthesia Checklist: Patient identified, Emergency Drugs available, Suction available and Patient being monitored Patient Re-evaluated:Patient Re-evaluated prior to induction Oxygen Delivery Method: Circle system utilized Preoxygenation: Pre-oxygenation with 100% oxygen Induction Type: IV induction Ventilation: Mask ventilation without difficulty LMA: LMA inserted LMA Size: 4.0 Number of attempts: 1 Placement Confirmation: positive ETCO2 Tube secured with: Tape Dental Injury: Teeth and Oropharynx as per pre-operative assessment        

## 2019-10-02 NOTE — Discharge Instructions (Signed)

## 2019-10-02 NOTE — Anesthesia Procedure Notes (Signed)
Anesthesia Regional Block: Adductor canal block   Pre-Anesthetic Checklist: ,, timeout performed, Correct Patient, Correct Site, Correct Laterality, Correct Procedure, Correct Position, site marked, Risks and benefits discussed,  Surgical consent,  Pre-op evaluation,  At surgeon's request and post-op pain management  Laterality: Right  Prep: Maximum Sterile Barrier Precautions used, chloraprep       Needles:  Injection technique: Single-shot  Needle Type: Echogenic Stimulator Needle     Needle Length: 9cm  Needle Gauge: 22     Additional Needles:   Procedures:,,,, ultrasound used (permanent image in chart),,,,  Narrative:  Start time: 10/02/2019 10:26 AM End time: 10/02/2019 10:36 AM Injection made incrementally with aspirations every 5 mL.  Performed by: Personally  Anesthesiologist: Freddrick March, MD  Additional Notes: Monitors applied. No increased pain on injection. No increased resistance to injection. Injection made in 5cc increments. Good needle visualization. Patient tolerated procedure well.

## 2019-10-03 NOTE — Anesthesia Postprocedure Evaluation (Signed)
Anesthesia Post Note  Patient: Dawn Hamilton  Procedure(s) Performed: KNEE ARTHROSCOPY WITH MEDIAL PATELLAR FEMORAL LIGAMENT RECONSTRUCTION WITH ALLOGRAPH (Right Knee)     Patient location during evaluation: PACU Anesthesia Type: Regional and General Level of consciousness: awake and alert Pain management: pain level controlled Vital Signs Assessment: post-procedure vital signs reviewed and stable Respiratory status: spontaneous breathing, nonlabored ventilation, respiratory function stable and patient connected to nasal cannula oxygen Cardiovascular status: blood pressure returned to baseline and stable Postop Assessment: no apparent nausea or vomiting Anesthetic complications: no    Last Vitals:  Vitals:   10/02/19 1300 10/02/19 1315  BP: 118/88 125/90  Pulse: 71 73  Resp: 14 11  Temp:    SpO2: 100% 99%    Last Pain:  Vitals:   10/02/19 0957  TempSrc: Oral                 Chelsey L Woodrum

## 2019-10-06 NOTE — Op Note (Signed)
Orthopaedic Surgery Operative Note (CSN: [id number])  Dawn Hamilton  1976/04/10 Date of Surgery: 10/02/2019   Diagnoses:  Right recurrent patellofemoral instability  Procedure: Right revision MPFL reconstruction   Operative Finding Exam under anesthesia: Range of motion full and symmetric to opposite knee, ligamentously stable exam with normal lachman, 3 quadrants lateral translation patella Suprapatellar pouch: normal Patellofemoral Compartment:  Grade 3 changes with fibrocartilage overlay on medial facet, unconstrained lesion.  Trochlea was essentially normal.  There was some lateral changes over the lateral femoral condyle consistent with her previous dislocations but this is outside of the weightbearing area. Medial Compartment:normal Lateral Compartment: normal Intercondylar Notch: normal  Successful completion of the planned procedure.  Good fixation on femur with 6.25 swivel lock and good graft.  Patella bone quality was lower than expected and had to replace one anchor due to this but good strength on final construct.    Post-operative plan: The patient will be dc home.  The patient will be WBAT in brace.  DVT prophylaxis aspirin 81 mg twice a day.   Pain control with PRN pain medication preferring oral medicines.  Follow up plan will be scheduled in approximately 7 days for incision check and XR.  Post-Op Diagnosis: Same Surgeons:Primary: Hiram Gash, MD Assistants:Caroline McBane PAC Location: [place] OR ROOM 6 Anesthesia: General with femoral nerve block Antibiotics: Ancef 2g preop, vancomycin local Tourniquet time:  Total Tourniquet Time Documented: Thigh (Right) - 73 minutes Total: Thigh (Right) - 73 minutes  Estimated Blood Loss: Minimal Complications: None Specimens: None Implants: Implant Name Type Inv. Item Serial No. Manufacturer Lot No. LRB No. Used Action  [employer] - [id number] [address] [id number] Right  1 Implanted  [employer] - [id number] [place]chor [place] [id number] Right 1 Implanted  [employer] - [id number] Anchor [place] [id number] Right 1 Implanted  [address] BIO - [id number] Anchor [address] [employer] [id number] Right 1 Implanted  [address] BIO - [id number] Anchor [address] [employer] [id number] Right 1 Implanted  TENDON SEMI-TENDIN[employer]US - [id number] Bone Implant TENDON [employer] [id number] [facility] T[facility] [id number] Right 1 Implanted    Indications for Surgery:   Dawn Hamilton is a 43 y.o. female with continued patellofemoral instability after previous MPFL reefing and tibial tubercle osteotomy performed by an outside Psychologist, sport and exercise.  She continued to have instability events and her TT TG is essentially normal at about 10 mm, her osteotomy seem to have healed well and she had a medial patellar facet unconstrained lesion.  Her primary complaint was instability rather than pain and we felt that an MPFL reconstruction may provide support for this.  If this failed she understood she would likely need total femoral arthroplasty.  Benefits and risks of operative and nonoperative management were discussed prior to surgery with patient/guardian(s) and informed consent form was completed.  Specific risks including infection, need for additional surgery, continued instability, infection, stiffness   Procedure:   The patient was identified properly. Informed consent was obtained and the surgical site was marked. The patient was taken up to suite where general anesthesia was induced. The patient was placed in the supine position with a post against the surgical leg and a nonsterile tourniquet applied. The surgical leg was then prepped and draped usual sterile fashion.  A standard surgical timeout was performed.  2 standard anterior portals were made and  diagnostic arthroscopy performed. Please  note the findings as noted above.  We performed gentle chondroplasty of the medial patellar facet.  This was unconstrained lesion and had fibrocartilage over part of it we attempted to leave this in place.  Attention was turned to the proximal medial patella where a proximal medial patellar skin incision was made and carried down through the skin and subcutaneous tissue.  The patient's previous incision was far to lateral to use to get fixation.  We felt that obtaining a new incision was reasonable.  The medial border of the patella was exposed down to layer 3.  We tagged the superficial tissue which was consistent with the attenuated MPFL remnant.  The joint was not entered.  We then used 2 - 2.4 mm arthrex suturetak anchor placed at the proximal 25% and 50% marks of the patella from proximal to distal transversely.  These would be used to hold our graft in place using a luggage loop type suture pass.  1 of these limbs pulled out and we placed an additional suture tack with good fixation.  Our graft was prepped in the form of a doubled over semitendinosus graft that passed through a 29mm tunnel.   This was secured as above to the patella at its mid portion and the two loose tails were then passed under layer 2 to the medial epicondyle.  We then made a 3 cm approach starting at the medial epicondyle extending just proximal and posterior.  We took care to dissect the superficial tissues bluntly and used blunt retraction to ensure that the neurovascular structures were out of our field.   We identified the medial epicondyle.  Blunt dissection was performed below the fascia outside of the capsule from the medial patella to the adductor tubercle.    Using a Beath pin under fluoroscopy image intensification, the Beath pin was placed at [place] and placed from a posterior to anterior and distal to proximal direction exiting the lateral thigh.  Good position was  noted on the fluoroscopic views.  The Beath pin and the adductor tubercle was over reamed with a 80mm cannulated reamer to the far lateral femoral cortex.  The sutures from the semitendinosus graft were then passed used the Beath pin exiting laterally.  With the knee in 30 degrees of flexion, the graft was appropriately tensioned to allow for appropriate medial lateral stability with approximately 34mm of lateral translation without being excessively tight.  Excellent tension was noted.  A guidepin was then placed in the femoral tunnel and the graft was secured using a 6.25 -mm Arthrex biocomposite swivel lock with excellent purchase noted and the medial patellofemoral ligament graft appropriately tensioned.  There was adequate medial lateral stability, but the patella was not excessively tight.  The arthroscope was placed back in the joint to check position and translation of the patella before and after graft fixation noting it to be stable and articulating within the trochlea.  The native MPFL tissue was repaired at both its patellar and femoral origins in a pants over vest style fashion to imbricate this loose tissue with #2 fiberwire.  All incisions were irrigated copiously and vancomycin powder was placed prior to closure in a multilayer fashion with absorbable suture.  The patient was awoken from general anesthesia and taken to the PACU in stable condition without complication.    Incisions closed with absorbable suture. The patient was awoken from general anesthesia and taken to the PACU in stable condition without complication.   Noemi Chapel, PA-C, present and  scrubbed throughout the case, critical for completion in a timely fashion, and for retraction, instrumentation, closure.

## 2019-10-07 ENCOUNTER — Ambulatory Visit: Payer: 59 | Attending: Orthopaedic Surgery

## 2019-10-07 ENCOUNTER — Encounter (HOSPITAL_BASED_OUTPATIENT_CLINIC_OR_DEPARTMENT_OTHER): Payer: Self-pay | Admitting: Orthopaedic Surgery

## 2019-10-07 ENCOUNTER — Other Ambulatory Visit: Payer: Self-pay

## 2019-10-07 DIAGNOSIS — M25661 Stiffness of right knee, not elsewhere classified: Secondary | ICD-10-CM | POA: Insufficient documentation

## 2019-10-07 DIAGNOSIS — R2689 Other abnormalities of gait and mobility: Secondary | ICD-10-CM | POA: Diagnosis not present

## 2019-10-07 DIAGNOSIS — M6281 Muscle weakness (generalized): Secondary | ICD-10-CM | POA: Diagnosis not present

## 2019-10-07 DIAGNOSIS — M2241 Chondromalacia patellae, right knee: Secondary | ICD-10-CM | POA: Diagnosis not present

## 2019-10-07 NOTE — Therapy (Signed)
[place] MAIN [facility] SERVICES [address] [place], [place], [address] Phone: [phone]   Fax:  [phone]  Physical Therapy Evaluation  Patient Details  Name: Dawn Hamilton MRN: [id number] Date of Birth: 07-14-1976 Referring Provider (PT): Griffin Basil, dax T   Encounter Date: 10/07/2019  PT End of Session - 10/07/19 1730    Visit Number  1    Number of Visits  16    Date for PT Re-Evaluation  12/02/19    Authorization Type  1/10 eval 11/24    PT Start Time  1601    PT Stop Time  1645    PT Time Calculation (min)  44 min    Equipment Utilized During Treatment  Right knee immobilizer    Activity Tolerance  Patient tolerated treatment well    Behavior During Therapy  [facility] for tasks assessed/performed       Past Medical History:  Diagnosis Date  . Allergy   . Anxiety   . Depression   . Precancerous skin lesion     Past Surgical History:  Procedure Laterality Date  . KNEE ARTHROSCOPY WITH MEDIAL PATELLAR FEMORAL LIGAMENT RECONSTRUCTION Right 10/02/2019   Procedure: KNEE ARTHROSCOPY WITH MEDIAL PATELLAR FEMORAL LIGAMENT RECONSTRUCTION WITH ALLOGRAPH;  Surgeon: Hiram Gash, MD;  Location: [place];  Service: Orthopedics;  Laterality: Right;  . KNEE SURGERY     x3    There were no vitals filed for this visit.   Subjective Assessment - 10/07/19 1657    Subjective  Patient is a pleasant 43 year old female who presents to physical therapy s/p Right revision MPFL reconstruction and patella chondroplasty  on 10/02/19    Pertinent History  Patient is a pleasant 43 year old female who presents to physical therapy s/p Right revision MPFL reconstruction and patella chondroplasty  on 10/02/19 with graft chosen as a doubled over semitendinosus graft. Patient is to be WBAT in brace locked in extension. Brace is to be worn at all times except for PT and hygiene for 0-4 weeks. She saw the physician this morning. Patient has a  history of MPFL reconstruction with first surgery 1996 and second surgery 2004. 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    Limitations  Sitting;Lifting;Standing;Walking;House hold activities    How long can you sit comfortably?  requires use of immobilizer locked out at this time    How long can you stand comfortably?  WBAT with knee immobilizer locked    How long can you walk comfortably?  fatigues after 30 ft but is able to walk functional distances at this time.    Diagnostic tests  s/p surgery    Patient Stated Goals  to return to PLOF.    Currently in Pain?  Yes    Pain Score  1     Pain Location  Knee    Pain Orientation  Right;Medial    Pain Descriptors / Indicators  Aching    Pain Type  Surgical pain    Pain Onset  In the past 7 days    Pain Frequency  Intermittent    Aggravating Factors   brushing against surgical site    Pain Relieving Factors  ice, rest    Effect of Pain on  Daily Activities  limits sleeping positions at this time.        second surgery 2004, first 1996.   [facility] PT Assessment - 10/07/19 0001      Assessment   Medical Diagnosis  R MPFL reconstruction and patella chondroplasty    Referring Provider (PT)  varkey, dax T    Onset Date/Surgical Date  10/02/19    Prior Therapy  yes      Precautions   Precautions  Knee    Precaution Booklet Issued  Yes (comment)    Precaution Comments  see note for details     Required Braces or Orthoses  Knee Immobilizer - Right    Knee Immobilizer - Right  Other (comment)   all times except for PT and hygiene     Restrictions   Weight Bearing Restrictions  Yes    Other Position/Activity Restrictions  WBAT with brace locked in extension, no active extension 0-60 degree  arc      Balance Screen   Has the patient fallen in the past 6 months  Yes    How many times?  1    Has the patient had a decrease in activity level because of a fear of falling?   Yes    Is the patient reluctant to leave their home because of a fear of falling?   No      Home Social worker  Private residence    Living Arrangements  Spouse/significant other;Children    Available Help at Discharge  Family    Type of [place] to enter    Entrance Stairs-Number of Steps  2    Entrance Stairs-Rails  None    Home Layout  Two level    Alternate Level Stairs-Number of Steps  16    Alternate Level Stairs-Rails  Right    Home Equipment  None      Prior Function   Level of Independence  Independent    Vocation  Full time employment    Designer, multimedia   Overall Cognitive Status  Within Functional Limits for tasks assessed      Flexibility   Soft Tissue Assessment /Muscle Length  yes    Hamstrings  limited R    Quadriceps  limited R            Per protocol: 0-4 weeks restrictions:  -gentle PROM to tolerance,  -Active knee flexion as tolerated: -Active extension from full flexion to 60 degrees is allowed: NO ACTIVE EXTENSION IN 0-60 DEGREE ARC. With goal: full ROM by 6 weeks -Strengthening: gentle quad sets, co-contractions, isometric quad/hamstring strengthening in extension and with knee in >60 degrees flexion.   PAIN: Worst pain : 5/10 Average 1-2/10    Observation: R Muscle atrophy: especially adductors  Slight foot internal rotation with resting position.   PROM/AROM: Supine active motion: R knee: 61 degrees flexion, resting position passively - 3 degrees extension   R hamstring limited but >50 degrees passively  R iliopsoas limited in length however unable to be tested at this time due to protocol limitations.  R posterior calf musculature limited. Multiple noted muscle trigger  points.   STRENGTH:  Graded on a 0-5 scale  Muscle testing limited by protocol/immobilization at this time : RLE: WFL LLE:   Hip flexion limited by knee immobilizer: grossly 3-/5 with limited muscle activation  Hip abduction in  sidelying: 4-/5  Hip adduction:modified test 3-/5  Knee: not tested this session  Ankle: WFL   SENSATION: Sensation has returned s/p nerve block  FUNCTIONAL MOBILITY: Transfer onto/off plinth table: initially able to lift RLE in knee immobilizer without assistance swinging limb onto table, when fatigued requires use of hand to assist.   Sit to stand: knee in immobilizer: outstretched: mainly use of LLE   GAIT: Assessed with knee immobilizer on RLE: hiking of R hip with vaulting gait pattern, limited hip extension of RLE with limited foot df/pf   OUTCOME MEASURES: TEST Outcome Interpretation  5 times sit<>stand Unable to test at this time >102 yo, >15 sec indicates increased risk for falls  10 meter walk test            0.91     m/s <1.0 m/s indicates increased risk for falls; limited community ambulator  Timed up and Go               Unable to test at this time <14 sec indicates increased risk for falls  6 minute walk test           Unable to test at this time 1000 feet is community ambulator  LEFS 36/80          HEP:  Weight shift onto/off of RLE with knee immobilizer 60 seconds intermittently throughout day  AAROM with towel flexion with pillow under knee 10x   Isometric hip adduction 10x 3 second holds with knee immobilizer on        Objective measurements completed on examination: See above findings.             PT Education - 10/07/19 1728    Education Details  exercise technique, goals, muscle tissue activation    Person(s) Educated  Patient    Methods  Explanation;Demonstration;Tactile cues;Verbal cues    Comprehension  Verbalized understanding;Returned demonstration;Verbal cues required;Tactile cues required       PT  Short Term Goals - 10/07/19 1736      PT SHORT TERM GOAL #1   Title  Patient will be independent in home exercise program to improve strength/mobility for better functional independence with ADLs.    Baseline  11/24: HEP given    Time  4    Period  Weeks    Status  New    Target Date  11/04/19      PT SHORT TERM GOAL #2   Title  Patient will demonstrate 100 degrees of flexion with active range of motion for improved gait mechanics and muscular activation/alignment    Baseline  11/24: 61    Time  4    Period  Weeks    Status  New    Target Date  11/04/19        PT Long Term Goals - 10/07/19 1738      PT LONG TERM GOAL #1   Title  Patient will demonstrate >/=115 degrees of flexion with active range of motion for improved gait mechanics and muscular activation/alignment and return to functional ROM    Baseline  11/24: 61 degrees    Time  8    Period  Weeks    Status  New    Target Date  12/02/19      PT LONG TERM GOAL #2   Title  Patient will increase BLE gross strength to 4+/5 as to improve functional strength for independent gait, increased standing tolerance and increased ADL ability.    Baseline  11/24: R hip grossly 3-/5 knee unable to be assessed    Time  8    Period  Weeks    Status  New    Target Date  12/02/19      PT LONG TERM GOAL #3   Title  Patient will increase lower extremity functional scale to >60/80 to demonstrate improved functional mobility and increased tolerance with ADLs.    Baseline  11/24: 36/80    Time  8    Period  Weeks    Status  New    Target Date  12/02/19      PT LONG TERM GOAL #4   Title  Patient will perform the 6 minute walk test for >1015ft with no episodes of hip hiking, vaulting, or antalgia for return to PLOF.    Baseline  11/24: unable to perform    Time  8    Period  Weeks    Status  New    Target Date  12/02/19      PT LONG TERM GOAL #5   Title  Patient will perform a squat with equal weight shift and no LOB or increase of  pain >1 degree on the VAS scale for functional activty performance and return to PLOF.    Baseline  11/24: unable to perform    Time  8    Period  Weeks    Status  New    Target Date  12/02/19             Plan - 10/07/19 1730    Clinical Impression Statement  Patient is a pleasant 43 year old female who presents s/p Right  MPFL reconstruction and patella chondroplasty  on 10/02/19 with graft chosen as a doubled over semitendinosus graft. Patient is to be WBAT in brace locked in extension. Brace is to be worn at all times except for PT and hygiene for 0-4 weeks. See therapy note for further protocol. Patient has limited iliopsoas and quad activation with noted atrophy of adductor musculature. She will benefit from skilled physical therapy to improve strength, range of motion, and mobility for return to PLOF while following protocol provided by surgeon.    Personal Factors and Comorbidities  Comorbidity 1;Education;Past/Current Experience    Comorbidities  two previous MPFL surgeries, anemia    Examination-Activity Limitations  Bathing;Bed Mobility;Caring for Others;Carry;Stairs;Squat;Sleep;Sit;Locomotion Level;Lift;Stand;Transfers    Examination-Participation Restrictions  Cleaning;Community Activity;Meal Prep;Laundry;Shop;Yard Work    Stability/Clinical Decision Making  Stable/Uncomplicated    Clinical Decision Making  Low    Rehab Potential  Excellent    PT Frequency  2x / week    PT Duration  8 weeks    PT Treatment/Interventions  ADLs/Self Care Home Management;Aquatic Therapy;Cryotherapy;Electrical Stimulation;Biofeedback;Iontophoresis 4mg /ml Dexamethasone;Moist Heat;Ultrasound;Functional mobility training;Stair training;Gait training;DME Instruction;Therapeutic activities;Therapeutic exercise;Balance training;Neuromuscular re-education;Manual techniques;Patient/family education;Compression bandaging;Scar mobilization;Passive range of motion;Dry needling;Taping;Splinting;Energy  conservation    PT Next Visit Plan  quad set on bolster, co contraction, isometrics,    PT Home Exercise Plan  see above    Consulted and Agree with Plan of Care  Patient       Patient will benefit from skilled therapeutic intervention in order to improve the following deficits and impairments:  Abnormal gait, Decreased activity tolerance, Decreased endurance, Decreased mobility, Decreased range of motion, Difficulty walking, Decreased strength, Decreased scar mobility, Increased edema, Impaired flexibility, Impaired perceived functional ability, Increased fascial restricitons, Improper body mechanics, Pain  Visit Diagnosis: Stiffness of right knee, not elsewhere classified  Other abnormalities of gait and  mobility  Muscle weakness (generalized)     Problem List Patient Active Problem List   Diagnosis Date Noted  . Plant irritant contact dermatitis 06/17/2019  . Iron deficiency anemia 12/09/2018  . Preventative health care 12/04/2016  . Seasonal allergic rhinitis 11/27/2014   Janna Arch, PT, DPT   10/07/2019, 5:43 PM  [place] MAIN [facility] SERVICES [address] [place], [place], [address] Phone: [phone]   Fax:  [phone]  Name: Dawn Hamilton MRN: [id number] Date of Birth: 1975-11-18

## 2019-10-14 ENCOUNTER — Ambulatory Visit: Payer: 59 | Attending: Orthopaedic Surgery

## 2019-10-14 ENCOUNTER — Other Ambulatory Visit: Payer: Self-pay

## 2019-10-14 DIAGNOSIS — M25661 Stiffness of right knee, not elsewhere classified: Secondary | ICD-10-CM

## 2019-10-14 DIAGNOSIS — M6281 Muscle weakness (generalized): Secondary | ICD-10-CM | POA: Diagnosis not present

## 2019-10-14 DIAGNOSIS — R2689 Other abnormalities of gait and mobility: Secondary | ICD-10-CM
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
## 2019-10-15 ENCOUNTER — Ambulatory Visit: Payer: 59

## 2019-10-16 ENCOUNTER — Ambulatory Visit: Payer: 59

## 2019-10-16 ENCOUNTER — Other Ambulatory Visit: Payer: Self-pay

## 2019-10-16 DIAGNOSIS — R2689 Other abnormalities of gait and mobility: Secondary | ICD-10-CM

## 2019-10-16 DIAGNOSIS — M6281 Muscle weakness (generalized): Secondary | ICD-10-CM

## 2019-10-16 DIAGNOSIS — M25661 Stiffness of right knee, not elsewhere classified: Secondary | ICD-10-CM | POA: Diagnosis not present

## 2019-10-16 NOTE — Therapy (Signed)
[place] MAIN [facility] SERVICES [address] [place], [place], [address] Phone: [phone]   Fax:  [phone]  Physical Therapy Treatment  Patient Details  Name: Dawn Hamilton MRN: [id number] Date of Birth: 1976-05-05 Referring Provider (PT): Griffin Basil, dax T   Encounter Date: 10/16/2019  PT End of Session - 10/16/19 1652    Visit Number  3    Number of Visits  16    Date for PT Re-Evaluation  12/02/19    Authorization Type  3/10 eval 11/24    PT Start Time  1259    PT Stop Time  1345    PT Time Calculation (min)  46 min    Equipment Utilized During Treatment  Right knee immobilizer    Activity Tolerance  Patient tolerated treatment well;Patient limited by pain    Behavior During Therapy  [facility] for tasks assessed/performed       Past Medical History:  Diagnosis Date  . Allergy   . Anxiety   . Depression   . Precancerous skin lesion     Past Surgical History:  Procedure Laterality Date  . KNEE ARTHROSCOPY WITH MEDIAL PATELLAR FEMORAL LIGAMENT RECONSTRUCTION Right 10/02/2019   Procedure: KNEE ARTHROSCOPY WITH MEDIAL PATELLAR FEMORAL LIGAMENT RECONSTRUCTION WITH ALLOGRAPH;  Surgeon: Hiram Gash, MD;  Location: [place];  Service: Orthopedics;  Laterality: Right;  . KNEE SURGERY     x3    There were no vitals filed for this visit.  Subjective Assessment - 10/16/19 1650    Subjective  Patient reports medial "knot" tender point in adductor. No redness or swelling noted, no heat in region of bump. Upon palpation it felt like a small "pebble" in the region.  Patient additionally reports patlla tendon pain. Has been compliant with HEP.    Pertinent History  Patient is a pleasant 43 year old female who presents to physical therapy s/p Right revision MPFL reconstruction and patella chondroplasty  on 10/02/19 with graft chosen as a doubled over semitendinosus graft. Patient is to be WBAT in brace locked in extension. Brace  is to be worn at all times except for PT and hygiene for 0-4 weeks. She saw the physician this morning. Patient has a history of MPFL reconstruction with first surgery 1996 and second surgery 2004. 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    Limitations  Sitting;Lifting;Standing;Walking;House hold activities    How long can you sit comfortably?  requires use of immobilizer locked out at this time    How long can you stand comfortably?  WBAT with knee immobilizer locked    How long can you walk comfortably?  fatigues after 30 ft but is able to walk functional distances at this time.    Diagnostic tests  s/p surgery    Patient Stated Goals  to return to PLOF.    Currently in Pain?  Yes    Pain Score  6     Pain Location  Knee    Pain Orientation  Right    Pain Descriptors / Indicators  Stabbing    Pain Type  Surgical pain    Pain Onset  In the past 7 days    Pain Frequency  Intermittent  Patient reports medial "knot" tender point in adductor. No redness or swelling noted, no heat in region of bump. Upon palpation it felt like a small "pebble" in the region.      Supine: wedge under LE for maintenance of proper angle of restriction  -passive ROM: Passive flexion of R knee into flexion and extension with varying arcs of abduction/adduction, noted adductor tightness and pain. Multiple repetitions with varying holds of 10-30 seconds with use of breathing and distraction for increased arc, improved to 90- grossly 95 degrees.   -AAROM flexion with patient improving with decreased muscle tissue tension/muscle guarding with repetition.   -contract relax: press into PT shoulder and then relax and PT presses into further flexion 12x  with varying hold times,   -STM to adductor musculature of medial and upper region of RLE: 3 minutes, multiple tension points and tenderness noted throughout musculature, improved ROM s/p   -ice cup massage to R knee/surgical site: 4 minutes  -trigger point release to calf : multiple trigger points noted 2 minutes  Supine with knee immobilizer on:  RTB ankle df: 15x RTB ankle inversion 15x RTB ankle eversion 15x straight leg raise: cue for posterior pelvic tilt, quad set then raise leg RLE 5x Straight leg abduction 10x   standing: Straight leg raise with immobilizer brace on: -hip extension RLE 10x; cueing for gluteal activation and foot alignment with UE support on chair -hip abduction with slight extension angle for glute med activation of RLE 10x with UE support on chair -cross body add/abd with UE support on chair 10x cueing for slow muscle activation/sequencing   Patient presents with increased pain and discomfort this session with primary regions of pain being in patella tendon and medial surgical site. Patient reports medial "knot" tender point in adductor. No redness or swelling noted, no heat in region of bump. Upon palpation it felt like a small "pebble" in the region. Patient educated on DVT signs and symptoms and to be aware of any changes. Improved knee flexion with passive ROM noted with improving muscle tissue length. She will benefit from skilled physical therapy to improve strength, range of motion, and mobility for return to PLOF while following protocol provided by surgeon.                PT Education - 10/16/19 1651    Education Details  exercise technique, body mechanics, passive ROM, DVT s/s    Person(s) Educated  Patient    Methods  Explanation;Demonstration;Tactile cues;Verbal cues    Comprehension  Verbalized understanding;Returned demonstration;Verbal cues required;Tactile cues required       PT Short Term Goals - 10/07/19 1736      PT  SHORT TERM GOAL #1   Title  Patient will be independent in home exercise program to improve strength/mobility for better functional independence with ADLs.    Baseline  11/24: HEP given    Time  4    Period  Weeks    Status  New    Target Date  11/04/19      PT SHORT TERM GOAL #2   Title  Patient will demonstrate 100 degrees of flexion with active range of motion for improved gait mechanics and muscular activation/alignment    Baseline  11/24: 61    Time  4    Period  Weeks    Status  New    Target Date  11/04/19        PT Long Term Goals - 10/07/19 1738  PT LONG TERM GOAL #1   Title  Patient will demonstrate >/=115 degrees of flexion with active range of motion for improved gait mechanics and muscular activation/alignment and return to functional ROM    Baseline  11/24: 61 degrees    Time  8    Period  Weeks    Status  New    Target Date  12/02/19      PT LONG TERM GOAL #2   Title  Patient will increase BLE gross strength to 4+/5 as to improve functional strength for independent gait, increased standing tolerance and increased ADL ability.    Baseline  11/24: R hip grossly 3-/5 knee unable to be assessed    Time  8    Period  Weeks    Status  New    Target Date  12/02/19      PT LONG TERM GOAL #3   Title  Patient will increase lower extremity functional scale to >60/80 to demonstrate improved functional mobility and increased tolerance with ADLs.    Baseline  11/24: 36/80    Time  8    Period  Weeks    Status  New    Target Date  12/02/19      PT LONG TERM GOAL #4   Title  Patient will perform the 6 minute walk test for >1043ft with no episodes of hip hiking, vaulting, or antalgia for return to PLOF.    Baseline  11/24: unable to perform    Time  8    Period  Weeks    Status  New    Target Date  12/02/19      PT LONG TERM GOAL #5   Title  Patient will perform a squat with equal weight shift and no LOB or increase of pain >1 degree on the VAS scale for  functional activty performance and return to PLOF.    Baseline  11/24: unable to perform    Time  8    Period  Weeks    Status  New    Target Date  12/02/19            Plan - 10/16/19 1653    Clinical Impression Statement  Patient presents with increased pain and discomfort this session with primary regions of pain being in patella tendon and medial surgical site. Patient reports medial "knot" tender point in adductor. No redness or swelling noted, no heat in region of bump. Upon palpation it felt like a small "pebble" in the region. Patient educated on DVT signs and symptoms and to be aware of any changes. Improved knee flexion with passive ROM noted with improving muscle tissue length. She will benefit from skilled physical therapy to improve strength, range of motion, and mobility for return to PLOF while following protocol provided by surgeon.    Personal Factors and Comorbidities  Comorbidity 1;Education;Past/Current Experience    Comorbidities  two previous MPFL surgeries, anemia    Examination-Activity Limitations  Bathing;Bed Mobility;Caring for Others;Carry;Stairs;Squat;Sleep;Sit;Locomotion Level;Lift;Stand;Transfers    Examination-Participation Restrictions  Cleaning;Community Activity;Meal Prep;Laundry;Shop;Yard Work    Stability/Clinical Decision Making  Stable/Uncomplicated    Rehab Potential  Excellent    PT Frequency  2x / week    PT Duration  8 weeks    PT Treatment/Interventions  ADLs/Self Care Home Management;Aquatic Therapy;Cryotherapy;Electrical Stimulation;Biofeedback;Iontophoresis 4mg /ml Dexamethasone;Moist Heat;Ultrasound;Functional mobility training;Stair training;Gait training;DME Instruction;Therapeutic activities;Therapeutic exercise;Balance training;Neuromuscular re-education;Manual techniques;Patient/family education;Compression bandaging;Scar mobilization;Passive range of motion;Dry needling;Taping;Splinting;Energy conservation    PT Next Visit Plan  quad set  on  bolster, co contraction, isometrics,    PT Home Exercise Plan  see above    Consulted and Agree with Plan of Care  Patient       Patient will benefit from skilled therapeutic intervention in order to improve the following deficits and impairments:  Abnormal gait, Decreased activity tolerance, Decreased endurance, Decreased mobility, Decreased range of motion, Difficulty walking, Decreased strength, Decreased scar mobility, Increased edema, Impaired flexibility, Impaired perceived functional ability, Increased fascial restricitons, Improper body mechanics, Pain  Visit Diagnosis: Stiffness of right knee, not elsewhere classified  Other abnormalities of gait and mobility  Muscle weakness (generalized)     Problem List Patient Active Problem List   Diagnosis Date Noted  . Plant irritant contact dermatitis 06/17/2019  . Iron deficiency anemia 12/09/2018  . Preventative health care 12/04/2016  . Seasonal allergic rhinitis 11/27/2014   Janna Arch, PT, DPT   10/16/2019, 4:54 PM  [place] MAIN [facility] SERVICES [address] [place], [place], [address] Phone: [phone]   Fax:  [phone]  Name: Dawn Hamilton MRN: [id number] Date of Birth: 18-Jul-1976

## 2019-10-20 ENCOUNTER — Ambulatory Visit: Payer: 59

## 2019-10-20 ENCOUNTER — Other Ambulatory Visit: Payer: Self-pay

## 2019-10-20 DIAGNOSIS — R2689 Other abnormalities of gait and mobility: Secondary | ICD-10-CM | POA: Diagnosis not present

## 2019-10-20 DIAGNOSIS — M6281 Muscle weakness (generalized): Secondary | ICD-10-CM | POA: Diagnosis not present

## 2019-10-20 DIAGNOSIS — M25661 Stiffness of right knee, not elsewhere classified: Secondary | ICD-10-CM | POA: Diagnosis not present

## 2019-10-20 NOTE — Therapy (Signed)
[place] MAIN [facility] SERVICES [address] [place], [place], [address] Phone: [phone]   Fax:  [phone]  Physical Therapy Treatment  Patient Details  Name: Dawn Hamilton MRN: [id number] Date of Birth: 09-06-1976 Referring Provider (PT): Griffin Basil, dax T   Encounter Date: 10/20/2019  PT End of Session - 10/20/19 1454    Visit Number  4    Number of Visits  16    Date for PT Re-Evaluation  12/02/19    Authorization Type  4/10 eval 11/24    PT Start Time  0759    PT Stop Time  0844    PT Time Calculation (min)  45 min    Equipment Utilized During Treatment  Right knee immobilizer    Activity Tolerance  Patient tolerated treatment well;Patient limited by pain    Behavior During Therapy  [facility] for tasks assessed/performed       Past Medical History:  Diagnosis Date  . Allergy   . Anxiety   . Depression   . Precancerous skin lesion     Past Surgical History:  Procedure Laterality Date  . KNEE ARTHROSCOPY WITH MEDIAL PATELLAR FEMORAL LIGAMENT RECONSTRUCTION Right 10/02/2019   Procedure: KNEE ARTHROSCOPY WITH MEDIAL PATELLAR FEMORAL LIGAMENT RECONSTRUCTION WITH ALLOGRAPH;  Surgeon: Hiram Gash, MD;  Location: [place];  Service: Orthopedics;  Laterality: Right;  . KNEE SURGERY     x3    There were no vitals filed for this visit.  Subjective Assessment - 10/20/19 1449    Subjective  Patient reports feeling stiffer yesterday and this morning. Has been compliant with HEP daily. No falls or LOB since last session.    Pertinent History  Patient is a pleasant 43 year old female who presents to physical therapy s/p Right revision MPFL reconstruction and patella chondroplasty  on 10/02/19 with graft chosen as a doubled over semitendinosus graft. Patient is to be WBAT in brace locked in extension. Brace is to be worn at all times except for PT and hygiene for 0-4 weeks. She saw the physician this morning. Patient has a  history of MPFL reconstruction with first surgery 1996 and second surgery 2004. 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    Limitations  Sitting;Lifting;Standing;Walking;House hold activities    How long can you sit comfortably?  requires use of immobilizer locked out at this time    How long can you stand comfortably?  WBAT with knee immobilizer locked    How long can you walk comfortably?  fatigues after 30 ft but is able to walk functional distances at this time.    Diagnostic tests  s/p surgery    Patient Stated Goals  to return to PLOF.    Currently in Pain?  Yes    Pain Score  6     Pain Location  Knee    Pain Orientation  Right    Pain Descriptors / Indicators  Stabbing    Pain Type  Surgical pain    Pain Radiating Towards  throughout limb with flexion    Pain Onset  More than a month ago    Pain Frequency  Intermittent          Supine: wedge under LE for maintenance of proper  angle of restriction  -passive ROM: Passive flexion of R knee into flexion and extension with varying arcs of abduction/adduction, noted adductor tightness and pain. Multiple repetitions with varying holds of 10-30 seconds with use of breathing and distraction for increased arc, improved to 90- grossly 95 degrees. Noted "stop" with hip flexor tightness, requiring use of distraction with belt at hip flexor to allow for flexion of knee.    -AAROM flexion with patient improving with decreased muscle tissue tension/muscle guarding with repetition.    -contract relax: press into PT shoulder and then relax and PT presses into further flexion 12x with varying hold times,    STM to iliopsoas and upper quad musculature/tendon unit of RLE due to pain/stiffness  limiting passive ROM  -ice cup massage to R knee/surgical site: 4 minutes  Sidelying; Hip flexor stretch (added to HEP) with brace on 30 second hold Hip extension with overpressure applied by PT with brace on 5x 20 second holds  Prone: Prop on elbows: no stretch reported by patient (patient in brace) -hip extension AAROM with PT guidance/assistance for optimal stretch with brace on.      Patient presents with increased stiffness and limited hip/knee flexion initially. Use of distraction with belt to hip flexor combined with passive knee flexion allowed for progressive stretch in tendons with increasing range and decreased pain. Education and performance of sidelying hip flexor stretch performed.  She will benefit from skilled physical therapy to improve strength, range of motion, and mobility for return to PLOF while following protocol provided by surgeon.                 PT Education - 10/20/19 1454    Education Details  hip flexor stretch, body mechanics, manual    Person(s) Educated  Patient    Methods  Explanation;Demonstration;Tactile cues;Verbal cues    Comprehension  Verbalized understanding;Returned demonstration;Verbal cues required;Tactile cues required       PT Short Term Goals - 10/07/19 1736      PT SHORT TERM GOAL #1   Title  Patient will be independent in home exercise program to improve strength/mobility for better functional independence with ADLs.    Baseline  11/24: HEP given    Time  4    Period  Weeks    Status  New    Target Date  11/04/19      PT SHORT TERM GOAL #2   Title  Patient will demonstrate 100 degrees of flexion with active range of motion for improved gait mechanics and muscular activation/alignment    Baseline  11/24: 61    Time  4    Period  Weeks    Status  New    Target Date  11/04/19        PT Long Term Goals - 10/07/19 1738      PT LONG TERM GOAL #1   Title  Patient will demonstrate >/=115 degrees of flexion with  active range of motion for improved gait mechanics and muscular activation/alignment and return to functional ROM    Baseline  11/24: 61 degrees    Time  8    Period  Weeks    Status  New    Target Date  12/02/19      PT LONG TERM GOAL #2   Title  Patient will increase BLE gross strength to 4+/5 as to improve functional strength for independent gait, increased standing tolerance and increased ADL ability.    Baseline  11/24: R hip grossly  3-/5 knee unable to be assessed    Time  8    Period  Weeks    Status  New    Target Date  12/02/19      PT LONG TERM GOAL #3   Title  Patient will increase lower extremity functional scale to >60/80 to demonstrate improved functional mobility and increased tolerance with ADLs.    Baseline  11/24: 36/80    Time  8    Period  Weeks    Status  New    Target Date  12/02/19      PT LONG TERM GOAL #4   Title  Patient will perform the 6 minute walk test for >1047ft with no episodes of hip hiking, vaulting, or antalgia for return to PLOF.    Baseline  11/24: unable to perform    Time  8    Period  Weeks    Status  New    Target Date  12/02/19      PT LONG TERM GOAL #5   Title  Patient will perform a squat with equal weight shift and no LOB or increase of pain >1 degree on the VAS scale for functional activty performance and return to PLOF.    Baseline  11/24: unable to perform    Time  8    Period  Weeks    Status  New    Target Date  12/02/19            Plan - 10/20/19 1455    Clinical Impression Statement  Patient presents with increased stiffness and limited hip/knee flexion initially. Use of distraction with belt to hip flexor combined with passive knee flexion allowed for progressive stretch in tendons with increasing range and decreased pain. Education and performance of sidelying hip flexor stretch performed.  She will benefit from skilled physical therapy to improve strength, range of motion, and mobility for return to PLOF while  following protocol provided by surgeon.    Personal Factors and Comorbidities  Comorbidity 1;Education;Past/Current Experience    Comorbidities  two previous MPFL surgeries, anemia    Examination-Activity Limitations  Bathing;Bed Mobility;Caring for Others;Carry;Stairs;Squat;Sleep;Sit;Locomotion Level;Lift;Stand;Transfers    Examination-Participation Restrictions  Cleaning;Community Activity;Meal Prep;Laundry;Shop;Yard Work    Stability/Clinical Decision Making  Stable/Uncomplicated    Rehab Potential  Excellent    PT Frequency  2x / week    PT Duration  8 weeks    PT Treatment/Interventions  ADLs/Self Care Home Management;Aquatic Therapy;Cryotherapy;Electrical Stimulation;Biofeedback;Iontophoresis 4mg /ml Dexamethasone;Moist Heat;Ultrasound;Functional mobility training;Stair training;Gait training;DME Instruction;Therapeutic activities;Therapeutic exercise;Balance training;Neuromuscular re-education;Manual techniques;Patient/family education;Compression bandaging;Scar mobilization;Passive range of motion;Dry needling;Taping;Splinting;Energy conservation    PT Next Visit Plan  quad set on bolster, co contraction, isometrics,    PT Home Exercise Plan  see above    Consulted and Agree with Plan of Care  Patient       Patient will benefit from skilled therapeutic intervention in order to improve the following deficits and impairments:  Abnormal gait, Decreased activity tolerance, Decreased endurance, Decreased mobility, Decreased range of motion, Difficulty walking, Decreased strength, Decreased scar mobility, Increased edema, Impaired flexibility, Impaired perceived functional ability, Increased fascial restricitons, Improper body mechanics, Pain  Visit Diagnosis: Stiffness of right knee, not elsewhere classified  Other abnormalities of gait and mobility  Muscle weakness (generalized)     Problem List Patient Active Problem List   Diagnosis Date Noted  . Plant irritant contact dermatitis  06/17/2019  . Iron deficiency anemia 12/09/2018  . Preventative health care 12/04/2016  . Seasonal allergic  rhinitis 11/27/2014   Janna Arch, PT, DPT   10/20/2019, 2:56 PM  [place] MAIN [facility] SERVICES [address] [place], [place], [address] Phone: [phone]   Fax:  [phone]  Name: Dawn Hamilton MRN: [id number] Date of Birth: 1976-08-13

## 2019-10-23 ENCOUNTER — Ambulatory Visit: Payer: 59

## 2019-10-23 ENCOUNTER — Other Ambulatory Visit: Payer: Self-pay

## 2019-10-23 DIAGNOSIS — M25661 Stiffness of right knee, not elsewhere classified: Secondary | ICD-10-CM | POA: Diagnosis not present

## 2019-10-23 DIAGNOSIS — M6281 Muscle weakness (generalized): Secondary | ICD-10-CM | POA: Diagnosis not present

## 2019-10-23 DIAGNOSIS — R2689 Other abnormalities of gait and mobility: Secondary | ICD-10-CM | POA: Diagnosis not present

## 2019-10-23 NOTE — Therapy (Signed)
[place] MAIN [facility] SERVICES [address] [place], [place], [address] Phone: [phone]   Fax:  [phone]  Physical Therapy Treatment  Patient Details  Name: Dawn Hamilton MRN: [id number] Date of Birth: [birth year] Referring Provider (PT): Griffin Basil, dax T   Encounter Date: 10/23/2019  PT End of Session - 10/23/19 1720    Visit Number  5    Number of Visits  16    Date for PT Re-Evaluation  12/02/19    Authorization Type  5/10 eval 11/24    PT Start Time  1116    PT Stop Time  1201    PT Time Calculation (min)  45 min    Equipment Utilized During Treatment  Right knee immobilizer    Activity Tolerance  Patient tolerated treatment well;Patient limited by pain    Behavior During Therapy  [facility] for tasks assessed/performed       Past Medical History:  Diagnosis Date  . Allergy   . Anxiety   . Depression   . Precancerous skin lesion     Past Surgical History:  Procedure Laterality Date  . KNEE ARTHROSCOPY WITH MEDIAL PATELLAR FEMORAL LIGAMENT RECONSTRUCTION Right 10/02/2019   Procedure: KNEE ARTHROSCOPY WITH MEDIAL PATELLAR FEMORAL LIGAMENT RECONSTRUCTION WITH ALLOGRAPH;  Surgeon: Hiram Gash, MD;  Location: [place];  Service: Orthopedics;  Laterality: Right;  . KNEE SURGERY     x3    There were no vitals filed for this visit.  Subjective Assessment - 10/23/19 1719    Subjective  Patient reports compliance with HEP. Will see surgeon next week. No falls or LOB since last session. Is noticing an improvement in swelling in surgical knee.    Pertinent History  Patient is a pleasant 43 year old female who presents to physical therapy s/p Right revision MPFL reconstruction and patella chondroplasty  on 10/02/19 with graft chosen as a doubled over semitendinosus graft. Patient is to be WBAT in brace locked in extension. Brace is to be worn at all times except for PT and hygiene for 0-4 weeks. She saw the physician  this morning. Patient has a history of MPFL reconstruction with first surgery 1996 and second surgery 2004. 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    Limitations  Sitting;Lifting;Standing;Walking;House hold activities    How long can you sit comfortably?  requires use of immobilizer locked out at this time    How long can you stand comfortably?  WBAT with knee immobilizer locked    How long can you walk comfortably?  fatigues after 30 ft but is able to walk functional distances at this time.    Diagnostic tests  s/p surgery    Patient Stated Goals  to return to PLOF.    Currently in Pain?  Yes    Pain Score  4     Pain Location  Knee    Pain Orientation  Right    Pain Descriptors / Indicators  Stabbing    Pain Type  Surgical pain    Pain Radiating Towards  with movement of knee into flexion    Pain Onset  More than a month ago    Pain Frequency  Intermittent  Supine: wedge under LE for maintenance of proper angle of restriction  -passive ROM: Passive flexion of R knee into flexion and extension with varying arcs of abduction/adduction, noted adductor tightness and pain. Multiple repetitions with varying holds of 10-30 seconds with use of breathing and distraction for increased arc, improved to 90- grossly 95degrees. Noted "stop" with hip flexor tightness, requiring use of inferior distraction with belt at hip flexor to allow for flexion of knee.   -AAROM flexion with patient improving with decreased muscle tissue tension/muscle guarding with repetition.   STM to iliopsoas and upper quad musculature/tendon unit of RLE due to pain/stiffness limiting passive ROM; STM to lateral scar tissue region with cross friction and with the  grain strokes for reduction of swelling, scar tissue buildup, and pain, only gentle stroked tolerated.   -ice cup massage to R knee/surgical site: 4 minutes  supine to sitting: PT provide stabilization to limb, cueing for LLE to assist under RLE, and towel under knee for carryover to home  sitting: Sitting edge of raised plinth table, gently assisting gravity in knee flexion to ~85 initially, with progressive increase in flexion from PT to patient to ~92 degrees with cueing for neutralizing hip/pelvic alignment due to tendency to shift onto non affected limb. X 6 minutes   Sitting to supine: education on safe transition with use of towel under knee and LLE under RLE for stabilization and compliance with protocol.    Patient continues to progress with functional range of motion. She demonstrates improved flexion in varying planes of motion with introduction to sitting position performed this session. Patient continues to have hip flexion "stop" in supine that is improved with use of inferior traction/distraction to hip for opening of limb. She will benefit from skilled physical therapy to improve strength, range of motion, and mobility for return to PLOF while following protocol provided by surgeon.                PT Education - 10/23/19 1720    Education Details  seated knee flexion, body mechanics, safety    Person(s) Educated  Patient    Methods  Explanation;Demonstration;Tactile cues;Verbal cues    Comprehension  Verbalized understanding;Returned demonstration;Verbal cues required;Tactile cues required       PT Short Term Goals - 10/07/19 1736      PT SHORT TERM GOAL #1   Title  Patient will be independent in home exercise program to improve strength/mobility for better functional independence with ADLs.    Baseline  11/24: HEP given    Time  4    Period  Weeks    Status  New    Target Date  11/04/19      PT SHORT TERM GOAL #2   Title  Patient will demonstrate 100  degrees of flexion with active range of motion for improved gait mechanics and muscular activation/alignment    Baseline  11/24: 61    Time  4    Period  Weeks    Status  New    Target Date  11/04/19        PT Long Term Goals - 10/07/19 1738      PT LONG TERM GOAL #1   Title  Patient will demonstrate >/=115 degrees of flexion with active range of motion for improved gait mechanics and muscular activation/alignment and return to functional ROM    Baseline  11/24: 61 degrees    Time  8    Period  Weeks    Status  New    Target Date  12/02/19      PT LONG TERM GOAL #2   Title  Patient will increase BLE gross strength to 4+/5 as to improve functional strength for independent gait, increased standing tolerance and increased ADL ability.    Baseline  11/24: R hip grossly 3-/5 knee unable to be assessed    Time  8    Period  Weeks    Status  New    Target Date  12/02/19      PT LONG TERM GOAL #3   Title  Patient will increase lower extremity functional scale to >60/80 to demonstrate improved functional mobility and increased tolerance with ADLs.    Baseline  11/24: 36/80    Time  8    Period  Weeks    Status  New    Target Date  12/02/19      PT LONG TERM GOAL #4   Title  Patient will perform the 6 minute walk test for >1050ft with no episodes of hip hiking, vaulting, or antalgia for return to PLOF.    Baseline  11/24: unable to perform    Time  8    Period  Weeks    Status  New    Target Date  12/02/19      PT LONG TERM GOAL #5   Title  Patient will perform a squat with equal weight shift and no LOB or increase of pain >1 degree on the VAS scale for functional activty performance and return to PLOF.    Baseline  11/24: unable to perform    Time  8    Period  Weeks    Status  New    Target Date  12/02/19            Plan - 10/23/19 1721    Clinical Impression Statement  Patient continues to progress with functional range of motion. She demonstrates improved flexion  in varying planes of motion with introduction to sitting position performed this session. Patient continues to have hip flexion "stop" in supine that is improved with use of inferior traction/distraction to hip for opening of limb. She will benefit from skilled physical therapy to improve strength, range of motion, and mobility for return to PLOF while following protocol provided by surgeon.    Personal Factors and Comorbidities  Comorbidity 1;Education;Past/Current Experience    Comorbidities  two previous MPFL surgeries, anemia    Examination-Activity Limitations  Bathing;Bed Mobility;Caring for Others;Carry;Stairs;Squat;Sleep;Sit;Locomotion Level;Lift;Stand;Transfers    Examination-Participation Restrictions  Cleaning;Community Activity;Meal Prep;Laundry;Shop;Yard Work    Stability/Clinical Decision Making  Stable/Uncomplicated    Rehab Potential  Excellent    PT Frequency  2x / week    PT Duration  8 weeks    PT Treatment/Interventions  ADLs/Self Care Home Management;Aquatic Therapy;Cryotherapy;Electrical Stimulation;Biofeedback;Iontophoresis 4mg /ml Dexamethasone;Moist Heat;Ultrasound;Functional mobility training;Stair training;Gait training;DME Instruction;Therapeutic activities;Therapeutic exercise;Balance training;Neuromuscular re-education;Manual techniques;Patient/family education;Compression bandaging;Scar mobilization;Passive range of motion;Dry needling;Taping;Splinting;Energy conservation    PT Next Visit Plan  quad set on bolster, co contraction, isometrics,    PT Home Exercise Plan  see above    Consulted and Agree with Plan of Care  Patient       Patient will benefit from skilled therapeutic intervention in order to improve the following deficits and impairments:  Abnormal gait, Decreased activity tolerance, Decreased endurance, Decreased mobility, Decreased range of motion, Difficulty walking, Decreased strength, Decreased scar mobility, Increased edema, Impaired flexibility,  Impaired perceived functional ability, Increased fascial restricitons, Improper body mechanics, Pain  Visit Diagnosis: Stiffness of right knee, not elsewhere classified  Other abnormalities of gait and mobility  Muscle weakness (generalized)     Problem List Patient Active Problem List   Diagnosis Date Noted  . Plant irritant contact dermatitis 06/17/2019  . Iron deficiency anemia 12/09/2018  . Preventative health care 12/04/2016  . Seasonal allergic rhinitis 11/27/2014   Janna Arch, PT, DPT   10/23/2019, 5:22 PM  [place] MAIN [facility] SERVICES [address] [place], [place], [address] Phone: [phone]   Fax:  [phone]  Name: Jahmiyah Fratello MRN: [id number] Date of Birth: 1976/03/15

## 2019-10-27 ENCOUNTER — Other Ambulatory Visit: Payer: Self-pay

## 2019-10-27 ENCOUNTER — Ambulatory Visit: Payer: 59

## 2019-10-27 DIAGNOSIS — M25661 Stiffness of right knee, not elsewhere classified: Secondary | ICD-10-CM | POA: Diagnosis not present

## 2019-10-27 DIAGNOSIS — M6281 Muscle weakness (generalized): Secondary | ICD-10-CM

## 2019-10-27 DIAGNOSIS — R2689 Other abnormalities of gait and mobility: Secondary | ICD-10-CM

## 2019-10-27 NOTE — Therapy (Signed)
[place] MAIN [facility] SERVICES [address] [place], [place], [address] Phone: [phone]   Fax:  [phone]  Physical Therapy Treatment  Patient Details  Name: Dawn Hamilton MRN: [id number] Date of Birth: [birth year] Referring Provider (PT): Griffin Basil, dax T   Encounter Date: 10/27/2019  PT End of Session - 10/27/19 1128    Visit Number  6    Number of Visits  16    Date for PT Re-Evaluation  12/02/19    Authorization Type  6/10 eval 11/24    PT Start Time  0758    PT Stop Time  0853    PT Time Calculation (min)  55 min    Equipment Utilized During Treatment  Right knee immobilizer    Activity Tolerance  Patient tolerated treatment well;Patient limited by pain    Behavior During Therapy  [facility] for tasks assessed/performed       Past Medical History:  Diagnosis Date  . Allergy   . Anxiety   . Depression   . Precancerous skin lesion     Past Surgical History:  Procedure Laterality Date  . KNEE ARTHROSCOPY WITH MEDIAL PATELLAR FEMORAL LIGAMENT RECONSTRUCTION Right 10/02/2019   Procedure: KNEE ARTHROSCOPY WITH MEDIAL PATELLAR FEMORAL LIGAMENT RECONSTRUCTION WITH ALLOGRAPH;  Surgeon: Hiram Gash, MD;  Location: [place];  Service: Orthopedics;  Laterality: Right;  . KNEE SURGERY     x3    There were no vitals filed for this visit.  Subjective Assessment - 10/27/19 1125    Subjective  Patient reports compliance with HEP, has been completing her ROM daily. Is seeing surgeon tomorrow.    Pertinent History  Patient is a pleasant 43 year old female who presents to physical therapy s/p Right revision MPFL reconstruction and patella chondroplasty  on 10/02/19 with graft chosen as a doubled over semitendinosus graft. Patient is to be WBAT in brace locked in extension. Brace is to be worn at all times except for PT and hygiene for 0-4 weeks. She saw the physician this morning. Patient has a history of MPFL reconstruction  with first surgery 1996 and second surgery 2004. 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    Limitations  Sitting;Lifting;Standing;Walking;House hold activities    How long can you sit comfortably?  requires use of immobilizer locked out at this time    How long can you stand comfortably?  WBAT with knee immobilizer locked    How long can you walk comfortably?  fatigues after 30 ft but is able to walk functional distances at this time.    Diagnostic tests  s/p surgery    Patient Stated Goals  to return to PLOF.    Currently in Pain?  Yes    Pain Score  5     Pain Location  Knee    Pain Orientation  Right    Pain Descriptors / Indicators  Pounding;Stabbing    Pain Type  Surgical pain    Pain Onset  More than a month ago    Pain Frequency  Intermittent         Supine: wedge under LE for maintenance of proper angle of restriction  -passive ROM: Passive flexion of R knee into flexion and extension with varying arcs  of abduction/adduction, noted adductor tightness and pain. Multiple repetitions with varying holds of 10-30 seconds with use of breathing and distraction for increased arc, improved to 90- grossly 95degrees. Noted "stop" with hip flexor tightness, .   -AAROM flexion with patient improving with decreased muscle tissue tension/muscle guarding with repetition.   -STM to iliopsoas and upper quad musculature/tendon unit of RLE due to pain/stiffness limiting passive ROM; STM to lateral scar tissue region with cross friction and with the grain strokes for reduction of swelling, scar tissue buildup, and pain, only gentle stroked tolerated.   -ice cup massage to R knee/surgical site: 4 minutes  Seated: supine to sitting: PT provide  stabilization to limb, cueing for LLE to assist under RLE, and towel under knee for carryover to home  sitting: Sitting edge of raised plinth table, gently assisting gravity in knee flexion to ~85 initially, with progressive increase in flexion from PT to patient to ~92 degrees with cueing for neutralizing hip/pelvic alignment due to tendency to shift onto non affected limb. X 6 minutes  -shift left and right onto/off of glute for decreased compensation with increasing flexion with weight on R glute x 12  Sitting to supine: education on safe transition with use of towel under knee and LLE under RLE for stabilization and compliance with protocol.   Prone: R knee flexion PROM with increasing range with prolonged holds, hold for 30-60 seconds x 8 trials.  -roller to R hamstring, calf, and piriformis for reduction of muscle tissue knots and improved available movement.   Patient requires cueing for proper body mechanics and posture while retaining compliance with protocol for optimal mobility and progression of strength and ROM.                     PT Education - 10/27/19 1127    Education Details  exercise technique, body mechanics, ROM, manual    Person(s) Educated  Patient    Methods  Explanation;Demonstration;Tactile cues;Verbal cues;Handout    Comprehension  Verbalized understanding;Returned demonstration;Verbal cues required;Tactile cues required       PT Short Term Goals - 10/07/19 1736      PT SHORT TERM GOAL #1   Title  Patient will be independent in home exercise program to improve strength/mobility for better functional independence with ADLs.    Baseline  11/24: HEP given    Time  4    Period  Weeks    Status  New    Target Date  11/04/19      PT SHORT TERM GOAL #2   Title  Patient will demonstrate 100 degrees of flexion with active range of motion for improved gait mechanics and muscular activation/alignment    Baseline  11/24: 61    Time  4    Period   Weeks    Status  New    Target Date  11/04/19        PT Long Term Goals - 10/07/19 1738      PT LONG TERM GOAL #1   Title  Patient will demonstrate >/=115 degrees of flexion with active range of motion for improved gait mechanics and muscular activation/alignment and return to functional ROM    Baseline  11/24: 61 degrees    Time  8    Period  Weeks    Status  New    Target Date  12/02/19      PT LONG TERM GOAL #2   Title  Patient will increase BLE gross strength  to 4+/5 as to improve functional strength for independent gait, increased standing tolerance and increased ADL ability.    Baseline  11/24: R hip grossly 3-/5 knee unable to be assessed    Time  8    Period  Weeks    Status  New    Target Date  12/02/19      PT LONG TERM GOAL #3   Title  Patient will increase lower extremity functional scale to >60/80 to demonstrate improved functional mobility and increased tolerance with ADLs.    Baseline  11/24: 36/80    Time  8    Period  Weeks    Status  New    Target Date  12/02/19      PT LONG TERM GOAL #4   Title  Patient will perform the 6 minute walk test for >109ft with no episodes of hip hiking, vaulting, or antalgia for return to PLOF.    Baseline  11/24: unable to perform    Time  8    Period  Weeks    Status  New    Target Date  12/02/19      PT LONG TERM GOAL #5   Title  Patient will perform a squat with equal weight shift and no LOB or increase of pain >1 degree on the VAS scale for functional activty performance and return to PLOF.    Baseline  11/24: unable to perform    Time  8    Period  Weeks    Status  New    Target Date  12/02/19            Plan - 10/27/19 1136    Clinical Impression Statement  Patient presents with excellent motivation throughout physical therapy session. Patient is seeing surgeon tomorrow and reviewed list of questions for surgeon regarding protocol including termination of limitation of extension, use of immobilizer brace,  etc. Patient introduced to hip flexion in varying arc of motion for improved range and muscle tissue lengthening. She will benefit from skilled physical therapy to improve strength, range of motion, and mobility for return to PLOF while following protocol provided by surgeon.    Personal Factors and Comorbidities  Comorbidity 1;Education;Past/Current Experience    Comorbidities  two previous MPFL surgeries, anemia    Examination-Activity Limitations  Bathing;Bed Mobility;Caring for Others;Carry;Stairs;Squat;Sleep;Sit;Locomotion Level;Lift;Stand;Transfers    Examination-Participation Restrictions  Cleaning;Community Activity;Meal Prep;Laundry;Shop;Yard Work    Stability/Clinical Decision Making  Stable/Uncomplicated    Rehab Potential  Excellent    PT Frequency  2x / week    PT Duration  8 weeks    PT Treatment/Interventions  ADLs/Self Care Home Management;Aquatic Therapy;Cryotherapy;Electrical Stimulation;Biofeedback;Iontophoresis 4mg /ml Dexamethasone;Moist Heat;Ultrasound;Functional mobility training;Stair training;Gait training;DME Instruction;Therapeutic activities;Therapeutic exercise;Balance training;Neuromuscular re-education;Manual techniques;Patient/family education;Compression bandaging;Scar mobilization;Passive range of motion;Dry needling;Taping;Splinting;Energy conservation    PT Next Visit Plan  quad set on bolster, co contraction, isometrics,    PT Home Exercise Plan  see above    Consulted and Agree with Plan of Care  Patient       Patient will benefit from skilled therapeutic intervention in order to improve the following deficits and impairments:  Abnormal gait, Decreased activity tolerance, Decreased endurance, Decreased mobility, Decreased range of motion, Difficulty walking, Decreased strength, Decreased scar mobility, Increased edema, Impaired flexibility, Impaired perceived functional ability, Increased fascial restricitons, Improper body mechanics, Pain  Visit  Diagnosis: Stiffness of right knee, not elsewhere classified  Other abnormalities of gait and mobility  Muscle weakness (generalized)     Problem List  Patient Active Problem List   Diagnosis Date Noted  . Plant irritant contact dermatitis 06/17/2019  . Iron deficiency anemia 12/09/2018  . Preventative health care 12/04/2016  . Seasonal allergic rhinitis 11/27/2014   Janna Arch, PT, DPT   10/27/2019, 11:39 AM  [place] MAIN [facility] SERVICES [address] [place], [place], [address] Phone: [phone]   Fax:  [phone]  Name: Takeyla Vong MRN: [id number] Date of Birth: [birth year]

## 2019-10-28 DIAGNOSIS — M2241 Chondromalacia patellae, right knee: Secondary | ICD-10-CM | POA: Diagnosis not present

## 2019-10-30 ENCOUNTER — Ambulatory Visit: Payer: 59

## 2019-10-30 ENCOUNTER — Other Ambulatory Visit: Payer: Self-pay

## 2019-10-30 DIAGNOSIS — R2689 Other abnormalities of gait and mobility: Secondary | ICD-10-CM

## 2019-10-30 DIAGNOSIS — M6281 Muscle weakness (generalized): Secondary | ICD-10-CM | POA: Diagnosis not present

## 2019-10-30 DIAGNOSIS — M25661 Stiffness of right knee, not elsewhere classified: Secondary | ICD-10-CM | POA: Diagnosis not present

## 2019-10-30 NOTE — Therapy (Signed)
[place] MAIN [facility] SERVICES [address] [place], [place], [address] Phone: [phone]   Fax:  [phone]  Physical Therapy Treatment  Patient Details  Name: Dawn Hamilton MRN: [id number] Date of Birth: 21-Feb-1976 Referring Provider (PT): Griffin Basil, dax T   Encounter Date: 10/30/2019  PT End of Session - 10/30/19 1426    Visit Number  7    Number of Visits  16    Date for PT Re-Evaluation  12/02/19    Authorization Type  7/10 eval 11/24    PT Start Time  1116    PT Stop Time  1203    PT Time Calculation (min)  47 min    Activity Tolerance  Patient tolerated treatment well;Patient limited by pain    Behavior During Therapy  [facility] for tasks assessed/performed       Past Medical History:  Diagnosis Date  . Allergy   . Anxiety   . Depression   . Precancerous skin lesion     Past Surgical History:  Procedure Laterality Date  . KNEE ARTHROSCOPY WITH MEDIAL PATELLAR FEMORAL LIGAMENT RECONSTRUCTION Right 10/02/2019   Procedure: KNEE ARTHROSCOPY WITH MEDIAL PATELLAR FEMORAL LIGAMENT RECONSTRUCTION WITH ALLOGRAPH;  Surgeon: Hiram Gash, MD;  Location: [place];  Service: Orthopedics;  Laterality: Right;  . KNEE SURGERY     x3    There were no vitals filed for this visit.  Subjective Assessment - 10/30/19 1424    Subjective  Patient has met with surgeon, see note for further details on protocol progression. Surgeon is pleased with progress.    Pertinent History  Patient is a pleasant 43 year old female who presents to physical therapy s/p Right revision MPFL reconstruction and patella chondroplasty  on 10/02/19 with graft chosen as a doubled over semitendinosus graft. Patient is to be WBAT in brace locked in extension. Brace is to be worn at all times except for PT and hygiene for 0-4 weeks. She saw the physician this morning. Patient has a history of MPFL reconstruction with first surgery 1996 and second surgery 2004.  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    Limitations  Sitting;Lifting;Standing;Walking;House hold activities    How long can you sit comfortably?  requires use of immobilizer locked out at this time    How long can you stand comfortably?  WBAT with knee immobilizer locked    How long can you walk comfortably?  fatigues after 30 ft but is able to walk functional distances at this time.    Diagnostic tests  s/p surgery    Patient Stated Goals  to return to PLOF.    Currently in Pain?  Yes    Pain Score  3     Pain Location  Knee    Pain Orientation  Right    Pain Descriptors / Indicators  Stabbing    Pain Type  Surgical pain    Pain Onset  More than a month ago    Pain Frequency  Intermittent           Per physician:    improve brace to 30-90 range with gentle weaning from brace Mobs now ok AROM is ok now in full range Follow up 3 months post op  Goals: ROM 90 by 4 weeks Full by 6 weeks but will most likely not be full due to past surgical history.   Treatment:  Supine:  Contract relax; knee at 90 degrees into PT shoulder 10x with increasing knee flexion after each repetition  Progressive PROM with alternating hand positions for optimal lengthening.  short arc quad with bolster under knee: 10x with focus on neutral foot alignment first set, second set 10x with 3 second holds Single knee to chest active stretch 60 seconds Single knee to chest extend knee flex/extend foot 5x, 5 trials total Mobilizations to proximal tib fit joint for increased knee flexion with passive knee flexion 20 second mobilizations for 8 repetitions Patellar mobilizations: grade I-II, superior, inferior, medial, lateral, 10 seconds x 5 trials each  direction.  Ice cup massage 4 minutes    Prone: Passive knee flexion with increasing range with repetition 30-45 second holds x 5 trials   Seated: Heel slide with PT pressure to lateral aspect of knee to promote a neutral alignment due to medial rotation with extension RTB abduction, single limb at a time 10x each limb   Patient benefits from cueing for lateral musculature activation with knee extension to decrease medial rotation and decrease discomfort with end range extension.                PT Education - 10/30/19 1425    Education Details  progression of protocol, lateral muscle activation for straightening of knee for reduction of pain, progression of HEP    Person(s) Educated  Patient    Methods  Explanation;Demonstration;Tactile cues;Verbal cues    Comprehension  Verbalized understanding;Returned demonstration;Verbal cues required;Tactile cues required       PT Short Term Goals - 10/07/19 1736      PT SHORT TERM GOAL #1   Title  Patient will be independent in home exercise program to improve strength/mobility for better functional independence with ADLs.    Baseline  11/24: HEP given    Time  4    Period  Weeks    Status  New    Target Date  11/04/19      PT SHORT TERM GOAL #2   Title  Patient will demonstrate 100 degrees of flexion with active range of motion for improved gait mechanics and muscular activation/alignment    Baseline  11/24: 61    Time  4    Period  Weeks    Status  New    Target Date  11/04/19        PT Long Term Goals - 10/07/19 1738      PT LONG TERM GOAL #1   Title  Patient will demonstrate >/=115 degrees of flexion with active range of motion for improved gait mechanics and muscular activation/alignment and return to functional ROM    Baseline  11/24: 61 degrees    Time  8    Period  Weeks    Status  New    Target Date  12/02/19      PT LONG TERM GOAL #2   Title  Patient will increase BLE gross strength to 4+/5 as to  improve functional strength for independent gait, increased standing tolerance and increased ADL ability.    Baseline  11/24: R hip grossly 3-/5 knee unable to be assessed    Time  8    Period  Weeks    Status  New    Target Date  12/02/19      PT LONG TERM  GOAL #3   Title  Patient will increase lower extremity functional scale to >60/80 to demonstrate improved functional mobility and increased tolerance with ADLs.    Baseline  11/24: 36/80    Time  8    Period  Weeks    Status  New    Target Date  12/02/19      PT LONG TERM GOAL #4   Title  Patient will perform the 6 minute walk test for >1049f with no episodes of hip hiking, vaulting, or antalgia for return to PLOF.    Baseline  11/24: unable to perform    Time  8    Period  Weeks    Status  New    Target Date  12/02/19      PT LONG TERM GOAL #5   Title  Patient will perform a squat with equal weight shift and no LOB or increase of pain >1 degree on the VAS scale for functional activty performance and return to PLOF.    Baseline  11/24: unable to perform    Time  8    Period  Weeks    Status  New    Target Date  12/02/19            Plan - 10/30/19 1428    Clinical Impression Statement  Patient is now cleared for active ROM without restriction by physician, mobilizations, and unlocking of brace with gentle weaning from brace.  Patient introduced into increased ROM stretching and strengthening with focus on neutral alignment of knee with flexion and extension due to preference for medial rotation. Progression of Hep to include AROM given to patient with patient demonstrating understanding. Mobilizations introduced with gentle grade I initiated with patient tolerating them well.  She will benefit from skilled physical therapy to improve strength, range of motion, and mobility for return to PLOF while following protocol provided by surgeon.    Personal Factors and Comorbidities  Comorbidity 1;Education;Past/Current Experience     Comorbidities  two previous MPFL surgeries, anemia    Examination-Activity Limitations  Bathing;Bed Mobility;Caring for Others;Carry;Stairs;Squat;Sleep;Sit;Locomotion Level;Lift;Stand;Transfers    Examination-Participation Restrictions  Cleaning;Community Activity;Meal Prep;Laundry;Shop;Yard Work    Stability/Clinical Decision Making  Stable/Uncomplicated    Rehab Potential  Excellent    PT Frequency  2x / week    PT Duration  8 weeks    PT Treatment/Interventions  ADLs/Self Care Home Management;Aquatic Therapy;Cryotherapy;Electrical Stimulation;Biofeedback;Iontophoresis '4mg'$ /ml Dexamethasone;Moist Heat;Ultrasound;Functional mobility training;Stair training;Gait training;DME Instruction;Therapeutic activities;Therapeutic exercise;Balance training;Neuromuscular re-education;Manual techniques;Patient/family education;Compression bandaging;Scar mobilization;Passive range of motion;Dry needling;Taping;Splinting;Energy conservation    PT Next Visit Plan  quad set on bolster, co contraction, isometrics,    PT Home Exercise Plan  see above    Consulted and Agree with Plan of Care  Patient       Patient will benefit from skilled therapeutic intervention in order to improve the following deficits and impairments:  Abnormal gait, Decreased activity tolerance, Decreased endurance, Decreased mobility, Decreased range of motion, Difficulty walking, Decreased strength, Decreased scar mobility, Increased edema, Impaired flexibility, Impaired perceived functional ability, Increased fascial restricitons, Improper body mechanics, Pain  Visit Diagnosis: Stiffness of right knee, not elsewhere classified  Other abnormalities of gait and mobility  Muscle weakness (generalized)     Problem List Patient Active Problem List   Diagnosis Date Noted  . Plant irritant contact dermatitis 06/17/2019  . Iron deficiency anemia 12/09/2018  . Preventative health care 12/04/2016  . Seasonal allergic rhinitis  11/27/2014   MJanna Arch PT, DPT   10/30/2019,  3:44 PM  [place] MAIN [facility] SERVICES [address] [place], [place], [address] Phone: [phone]   Fax:  [phone]  Name: Dawn Hamilton MRN: [id number] Date of Birth: May 25, 1976

## 2019-11-03 ENCOUNTER — Other Ambulatory Visit: Payer: Self-pay

## 2019-11-03 ENCOUNTER — Ambulatory Visit: Payer: 59

## 2019-11-03 DIAGNOSIS — M6281 Muscle weakness (generalized): Secondary | ICD-10-CM | POA: Diagnosis not present

## 2019-11-03 DIAGNOSIS — R2689 Other abnormalities of gait and mobility: Secondary | ICD-10-CM | POA: Diagnosis not present

## 2019-11-03 DIAGNOSIS — M25661 Stiffness of right knee, not elsewhere classified: Secondary | ICD-10-CM | POA: Diagnosis not present

## 2019-11-03 NOTE — Therapy (Signed)
[place] MAIN [facility] SERVICES [address] [place], [place], [address] Phone: [phone]   Fax:  [phone]  Physical Therapy Treatment  Patient Details  Name: Dawn Hamilton MRN: [id number] Date of Birth: May 12, 1976 Referring Provider (PT): Griffin Basil, dax T   Encounter Date: 11/03/2019  PT End of Session - 11/03/19 1215    Visit Number  8    Number of Visits  16    Date for PT Re-Evaluation  12/02/19    Authorization Type  8/10 eval 11/24    PT Start Time  1108    PT Stop Time  1155    PT Time Calculation (min)  47 min    Activity Tolerance  Patient tolerated treatment well;Patient limited by pain    Behavior During Therapy  [facility] for tasks assessed/performed       Past Medical History:  Diagnosis Date  . Allergy   . Anxiety   . Depression   . Precancerous skin lesion     Past Surgical History:  Procedure Laterality Date  . KNEE ARTHROSCOPY WITH MEDIAL PATELLAR FEMORAL LIGAMENT RECONSTRUCTION Right 10/02/2019   Procedure: KNEE ARTHROSCOPY WITH MEDIAL PATELLAR FEMORAL LIGAMENT RECONSTRUCTION WITH ALLOGRAPH;  Surgeon: Hiram Gash, MD;  Location: [facility];  Service: Orthopedics;  Laterality: Right;  . KNEE SURGERY     x3    There were no vitals filed for this visit.  Subjective Assessment - 11/03/19 1213    Subjective  Patient has now unlocked immobilizer to 70 degrees of motion. Has been compliant with HEP, no falls or LOB since last session    Pertinent History  Patient is a pleasant 43 year old female who presents to physical therapy s/p Right revision MPFL reconstruction and patella chondroplasty  on 10/02/19 with graft chosen as a doubled over semitendinosus graft. Patient is to be WBAT in brace locked in extension. Brace is to be worn at all times except for PT and hygiene for 0-4 weeks. She saw the physician this morning. Patient has a history of MPFL reconstruction with first surgery 1996 and second  surgery 2004. 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    Limitations  Sitting;Lifting;Standing;Walking;House hold activities    How long can you sit comfortably?  requires use of immobilizer locked out at this time    How long can you stand comfortably?  WBAT with knee immobilizer locked    How long can you walk comfortably?  fatigues after 30 ft but is able to walk functional distances at this time.    Diagnostic tests  s/p surgery    Patient Stated Goals  to return to PLOF.    Currently in Pain?  Yes    Pain Score  4     Pain Location  Knee    Pain Orientation  Right    Pain Descriptors / Indicators  Stabbing    Pain Type  Surgical pain    Pain Onset  More than a month ago    Pain Frequency  Intermittent    Aggravating Factors   surgical site            Supine:  Contract relax; knee at 90 degrees into PT shoulder 10x with increasing knee flexion after each  repetition  Progressive PROM with alternating hand positions for optimal lengthening.  short arc quad with bolster under knee: 10x with focus on neutral foot alignment first set, second set with ER 10, third set with IR 10x, Mobilizations to proximal tib fit joint for increased knee flexion with passive knee flexion 20 second mobilizations for 8 repetitions Patellar mobilizations: grade I-II, superior, inferior, medial, lateral, 10 seconds x 5 trials each direction.  Ice cup massage 4 minutes    Prone: Passive knee flexion with increasing range with repetition 30-45 second holds x 7 trials  Hamstring curls RLE 10x (added to HEP)   Seated: Heel slide with PT pressure to lateral aspect of knee to promote a neutral alignment due to medial rotation with extension Contract  relax against PT resistance 10x 5 second holds with progressive knee flexion to ~95 degrees   Standing  weight shift with quad set for weight acceptance 10x Weight shift with quad set and opp LE march 8x  Patient benefits from cueing for lateral musculature activation with knee extension to decrease medial rotation and decrease discomfort with end range extension.                     PT Education - 11/03/19 1214    Education Details  exercise technique, weight shift in standing with quad set    Person(s) Educated  Patient    Methods  Explanation;Demonstration;Tactile cues;Verbal cues    Comprehension  Verbalized understanding;Returned demonstration;Verbal cues required;Tactile cues required       PT Short Term Goals - 10/07/19 1736      PT SHORT TERM GOAL #1   Title  Patient will be independent in home exercise program to improve strength/mobility for better functional independence with ADLs.    Baseline  11/24: HEP given    Time  4    Period  Weeks    Status  New    Target Date  11/04/19      PT SHORT TERM GOAL #2   Title  Patient will demonstrate 100 degrees of flexion with active range of motion for improved gait mechanics and muscular activation/alignment    Baseline  11/24: 61    Time  4    Period  Weeks    Status  New    Target Date  11/04/19        PT Long Term Goals - 10/07/19 1738      PT LONG TERM GOAL #1   Title  Patient will demonstrate >/=115 degrees of flexion with active range of motion for improved gait mechanics and muscular activation/alignment and return to functional ROM    Baseline  11/24: 61 degrees    Time  8    Period  Weeks    Status  New    Target Date  12/02/19      PT LONG TERM GOAL #2   Title  Patient will increase BLE gross strength to 4+/5 as to improve functional strength for independent gait, increased standing tolerance and increased ADL ability.    Baseline  11/24: R hip grossly 3-/5 knee unable to be assessed     Time  8    Period  Weeks    Status  New    Target Date  12/02/19      PT LONG TERM GOAL #3   Title  Patient will increase lower extremity functional scale to >60/80 to demonstrate improved functional mobility and increased tolerance with ADLs.  Baseline  11/24: 36/80    Time  8    Period  Weeks    Status  New    Target Date  12/02/19      PT LONG TERM GOAL #4   Title  Patient will perform the 6 minute walk test for >1051ft with no episodes of hip hiking, vaulting, or antalgia for return to PLOF.    Baseline  11/24: unable to perform    Time  8    Period  Weeks    Status  New    Target Date  12/02/19      PT LONG TERM GOAL #5   Title  Patient will perform a squat with equal weight shift and no LOB or increase of pain >1 degree on the VAS scale for functional activty performance and return to PLOF.    Baseline  11/24: unable to perform    Time  8    Period  Weeks    Status  New    Target Date  12/02/19            Plan - 11/03/19 1233    Clinical Impression Statement  Patient continues to progress with functional strength and functional range of motion. She continues to have pain and limitations in post surgical medial aspect of quad/adduction insert that improves with gentle rhythmic rotation. Introduction to standing weight shift with brace unlocked at 70. She will benefit from skilled physical therapy to improve strength, range of motion, and mobility for return to PLOF while following protocol provided by surgeon.    Personal Factors and Comorbidities  Comorbidity 1;Education;Past/Current Experience    Comorbidities  two previous MPFL surgeries, anemia    Examination-Activity Limitations  Bathing;Bed Mobility;Caring for Others;Carry;Stairs;Squat;Sleep;Sit;Locomotion Level;Lift;Stand;Transfers    Examination-Participation Restrictions  Cleaning;Community Activity;Meal Prep;Laundry;Shop;Yard Work    Stability/Clinical Decision Making  Stable/Uncomplicated    Rehab  Potential  Excellent    PT Frequency  2x / week    PT Duration  8 weeks    PT Treatment/Interventions  ADLs/Self Care Home Management;Aquatic Therapy;Cryotherapy;Electrical Stimulation;Biofeedback;Iontophoresis 4mg /ml Dexamethasone;Moist Heat;Ultrasound;Functional mobility training;Stair training;Gait training;DME Instruction;Therapeutic activities;Therapeutic exercise;Balance training;Neuromuscular re-education;Manual techniques;Patient/family education;Compression bandaging;Scar mobilization;Passive range of motion;Dry needling;Taping;Splinting;Energy conservation    PT Next Visit Plan  quad set on bolster, co contraction, isometrics,    PT Home Exercise Plan  see above    Consulted and Agree with Plan of Care  Patient       Patient will benefit from skilled therapeutic intervention in order to improve the following deficits and impairments:  Abnormal gait, Decreased activity tolerance, Decreased endurance, Decreased mobility, Decreased range of motion, Difficulty walking, Decreased strength, Decreased scar mobility, Increased edema, Impaired flexibility, Impaired perceived functional ability, Increased fascial restricitons, Improper body mechanics, Pain  Visit Diagnosis: Stiffness of right knee, not elsewhere classified  Other abnormalities of gait and mobility  Muscle weakness (generalized)     Problem List Patient Active Problem List   Diagnosis Date Noted  . Plant irritant contact dermatitis 06/17/2019  . Iron deficiency anemia 12/09/2018  . Preventative health care 12/04/2016  . Seasonal allergic rhinitis 11/27/2014   Janna Arch, PT, DPT   11/03/2019, 12:35 PM  [place] MAIN [facility] SERVICES [address] [place], [place], [address] Phone: [phone]   Fax:  [phone]  Name: Dawn Hamilton MRN: [id number] Date of Birth: [birth year]

## 2019-11-05 ENCOUNTER — Ambulatory Visit: Payer: 59

## 2019-11-05 ENCOUNTER — Other Ambulatory Visit: Payer: Self-pay

## 2019-11-05 DIAGNOSIS — M6281 Muscle weakness (generalized): Secondary | ICD-10-CM | POA: Diagnosis not present

## 2019-11-05 DIAGNOSIS — R2689 Other abnormalities of gait and mobility: Secondary | ICD-10-CM

## 2019-11-05 DIAGNOSIS — M25661 Stiffness of right knee, not elsewhere classified: Secondary | ICD-10-CM | POA: Diagnosis not present

## 2019-11-05 NOTE — Therapy (Signed)
[place] MAIN [facility] SERVICES [address] [place], [place], [address] Phone: [phone]   Fax:  [phone]  Physical Therapy Treatment  Patient Details  Name: Dawn Hamilton MRN: [id number] Date of Birth: Feb 03, 1976 Referring Provider (PT): Griffin Basil, dax T   Encounter Date: 11/05/2019  PT End of Session - 11/05/19 1357    Visit Number  9    Number of Visits  16    Date for PT Re-Evaluation  12/02/19    Authorization Type  9/10 eval 11/24    PT Start Time  1100    PT Stop Time  1147    PT Time Calculation (min)  47 min    Activity Tolerance  Patient tolerated treatment well;Patient limited by pain    Behavior During Therapy  [facility] for tasks assessed/performed       Past Medical History:  Diagnosis Date  . Allergy   . Anxiety   . Depression   . Precancerous skin lesion     Past Surgical History:  Procedure Laterality Date  . KNEE ARTHROSCOPY WITH MEDIAL PATELLAR FEMORAL LIGAMENT RECONSTRUCTION Right 10/02/2019   Procedure: KNEE ARTHROSCOPY WITH MEDIAL PATELLAR FEMORAL LIGAMENT RECONSTRUCTION WITH ALLOGRAPH;  Surgeon: Hiram Gash, MD;  Location: [facility];  Service: Orthopedics;  Laterality: Right;  . KNEE SURGERY     x3    There were no vitals filed for this visit.  Subjective Assessment - 11/05/19 1252    Subjective  Patient reports compliance with HEP. No falls or LOB, not wearing compression stocking under immobilizer.    Pertinent History  Patient is a pleasant 43 year old female who presents to physical therapy s/p Right revision MPFL reconstruction and patella chondroplasty  on 10/02/19 with graft chosen as a doubled over semitendinosus graft. Patient is to be WBAT in brace locked in extension. Brace is to be worn at all times except for PT and hygiene for 0-4 weeks. She saw the physician this morning. Patient has a history of MPFL reconstruction with first surgery 1996 and second surgery 2004. Other PMH  includes: iron deficiency anemia and surgery PMH includes: R roux ellsie trillant procedure 4/96, L lateral release 7/99, R medical retiniculum imbrication 2/04, c section 05/2009. 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    Limitations  Sitting;Lifting;Standing;Walking;House hold activities    How long can you sit comfortably?  requires use of immobilizer locked out at this time    How long can you stand comfortably?  WBAT with knee immobilizer locked    How long can you walk comfortably?  fatigues after 30 ft but is able to walk functional distances at this time.    Diagnostic tests  s/p surgery    Patient Stated Goals  to return to PLOF.    Currently in Pain?  No/denies            Supine:  Contract relax; knee at 90 degrees into PT shoulder 10x with increasing knee flexion after each repetition  Progressive PROM with alternating hand positions for optimal lengthening.  SLR with alternation of toe in/out, angles 10x Mobilizations to proximal tib fit joint for increased knee flexion with passive knee flexion 20 second mobilizations for 8 repetitions Patellar mobilizations: grade I-II, superior, inferior, medial, lateral, 10 seconds x 5 trials each direction.  Scar tissue massage to post surgical site x3 minutes, tender Ice cup massage 4 minutes  Prone:   Prone: Passive knee flexion with increasing range with repetition 30-45 second holds x 7 trials  Towel under knee for iliopsoas focus, hold 30 second x 4-5 trials.   sit to stand with raised plinth table, cueing for knee abduction 10x for weight shift onto RLE,  Sit to stand from raised plinth table, RTB around knees for abduction for standing. Cueing for control of eccentric portion 10x (added to HEP)  standing no brace:(added to HEP) - weight shift 10x -heel toe tandem stance rock for initial swing/pre-swing 10x each side -hamstring curl RLE 10x, cueing for  bringing knee back into extension prior to hamstring curls  Protocol for getting out of car when driving: -10 heel toe raises, 10 LAQ alternating LE's, 10 marches. Upon exiting car weight shift 10x   Measurement: 100 degrees knee flexion with PT overpressure after multiple rounds of progressive flexion with holds              PT Education - 11/05/19 1357    Education Details  exercise technique, non brace close chained interventions    Person(s) Educated  Patient    Methods  Explanation;Demonstration;Tactile cues;Verbal cues    Comprehension  Verbalized understanding;Returned demonstration;Verbal cues required;Tactile cues required       PT Short Term Goals - 10/07/19 1736      PT SHORT TERM GOAL #1   Title  Patient will be independent in home exercise program to improve strength/mobility for better functional independence with ADLs.    Baseline  11/24: HEP given    Time  4    Period  Weeks    Status  New    Target Date  11/04/19      PT SHORT TERM GOAL #2   Title  Patient will demonstrate 100 degrees of flexion with active range of motion for improved gait mechanics and muscular activation/alignment    Baseline  11/24: 61    Time  4    Period  Weeks    Status  New    Target Date  11/04/19        PT Long Term Goals - 10/07/19 1738      PT LONG TERM GOAL #1   Title  Patient will demonstrate >/=115 degrees of flexion with active range of motion for improved gait mechanics and muscular activation/alignment and return to functional ROM    Baseline  11/24: 61 degrees    Time  8    Period  Weeks    Status  New    Target Date  12/02/19      PT LONG TERM GOAL #2   Title  Patient will increase BLE gross strength to 4+/5 as to improve functional strength for independent gait, increased standing tolerance and increased ADL ability.    Baseline  11/24: R hip grossly 3-/5 knee unable to be assessed    Time  8    Period  Weeks    Status  New    Target Date  12/02/19       PT LONG TERM GOAL #3   Title  Patient will increase lower extremity functional scale to >60/80 to demonstrate improved functional mobility and increased tolerance with ADLs.    Baseline  11/24: 36/80    Time  8    Period  Weeks    Status  New    Target Date  12/02/19      PT LONG TERM GOAL #4   Title  Patient will perform  the 6 minute walk test for >1063ft with no episodes of hip hiking, vaulting, or antalgia for return to PLOF.    Baseline  11/24: unable to perform    Time  8    Period  Weeks    Status  New    Target Date  12/02/19      PT LONG TERM GOAL #5   Title  Patient will perform a squat with equal weight shift and no LOB or increase of pain >1 degree on the VAS scale for functional activty performance and return to PLOF.    Baseline  11/24: unable to perform    Time  8    Period  Weeks    Status  New    Target Date  12/02/19            Plan - 11/05/19 1401    Clinical Impression Statement  Patient is demonstrating excellent progress with functional range of motion and strength with 100 degrees of flexion obtained with PT overpressure this session after prolonged holds. New HEP added for progression of weightbearing/close chained without brace. Patient demonstrates understanding. Will be gone next week on vacation, education on exercises to perform for car trip. She will benefit from skilled physical therapy to improve strength, range of motion, and mobility for return to PLOF while following protocol provided by surgeon.    Personal Factors and Comorbidities  Comorbidity 1;Education;Past/Current Experience    Comorbidities  two previous MPFL surgeries, anemia    Examination-Activity Limitations  Bathing;Bed Mobility;Caring for Others;Carry;Stairs;Squat;Sleep;Sit;Locomotion Level;Lift;Stand;Transfers    Examination-Participation Restrictions  Cleaning;Community Activity;Meal Prep;Laundry;Shop;Yard Work    Stability/Clinical Decision Making  Stable/Uncomplicated     Rehab Potential  Excellent    PT Frequency  2x / week    PT Duration  8 weeks    PT Treatment/Interventions  ADLs/Self Care Home Management;Aquatic Therapy;Cryotherapy;Electrical Stimulation;Biofeedback;Iontophoresis 4mg /ml Dexamethasone;Moist Heat;Ultrasound;Functional mobility training;Stair training;Gait training;DME Instruction;Therapeutic activities;Therapeutic exercise;Balance training;Neuromuscular re-education;Manual techniques;Patient/family education;Compression bandaging;Scar mobilization;Passive range of motion;Dry needling;Taping;Splinting;Energy conservation    PT Next Visit Plan  progress note, stair stretch, TRX    PT Home Exercise Plan  see above    Consulted and Agree with Plan of Care  Patient       Patient will benefit from skilled therapeutic intervention in order to improve the following deficits and impairments:  Abnormal gait, Decreased activity tolerance, Decreased endurance, Decreased mobility, Decreased range of motion, Difficulty walking, Decreased strength, Decreased scar mobility, Increased edema, Impaired flexibility, Impaired perceived functional ability, Increased fascial restricitons, Improper body mechanics, Pain  Visit Diagnosis: Stiffness of right knee, not elsewhere classified  Other abnormalities of gait and mobility  Muscle weakness (generalized)     Problem List Patient Active Problem List   Diagnosis Date Noted  . Plant irritant contact dermatitis 06/17/2019  . Iron deficiency anemia 12/09/2018  . Preventative health care 12/04/2016  . Seasonal allergic rhinitis 11/27/2014   Janna Arch, PT, DPT   11/05/2019, 2:02 PM  [place] MAIN [facility] SERVICES [address] [place], [place], [address] Phone: [phone]   Fax:  [phone]  Name: Laren Lawrie MRN: [id number] Date of Birth: 1975/12/03

## 2019-11-18 ENCOUNTER — Ambulatory Visit: Payer: 59 | Attending: Orthopaedic Surgery

## 2019-11-18 ENCOUNTER — Other Ambulatory Visit: Payer: Self-pay

## 2019-11-18 DIAGNOSIS — M6281 Muscle weakness (generalized): Secondary | ICD-10-CM | POA: Diagnosis not present

## 2019-11-18 DIAGNOSIS — R2689 Other abnormalities of gait and mobility: Secondary | ICD-10-CM | POA: Diagnosis not present

## 2019-11-18 DIAGNOSIS — M25661 Stiffness of right knee, not elsewhere classified: Secondary | ICD-10-CM | POA: Diagnosis not present

## 2019-11-18 NOTE — Therapy (Signed)
[place] MAIN [facility] SERVICES [address] [place], [place], [address] Phone: [phone]   Fax:  [phone]  Physical Therapy Treatment Physical Therapy Progress Note   Dates of reporting period  10/07/19   to   11/18/19  Patient Details  Name: Dawn Hamilton MRN: [id number] Date of Birth: August 31, 1976 Referring Provider (PT): Griffin Basil, dax T   Encounter Date: 11/18/2019  PT End of Session - 11/18/19 1240    Visit Number  10    Number of Visits  16    Date for PT Re-Evaluation  12/02/19    Authorization Type  10/10 eval 11/24; next session 1/10 PN 11/18/19    PT Start Time  1115    PT Stop Time  1158    PT Time Calculation (min)  43 min    Activity Tolerance  Patient tolerated treatment well    Behavior During Therapy  [facility] for tasks assessed/performed       Past Medical History:  Diagnosis Date  . Allergy   . Anxiety   . Depression   . Precancerous skin lesion     Past Surgical History:  Procedure Laterality Date  . KNEE ARTHROSCOPY WITH MEDIAL PATELLAR FEMORAL LIGAMENT RECONSTRUCTION Right 10/02/2019   Procedure: KNEE ARTHROSCOPY WITH MEDIAL PATELLAR FEMORAL LIGAMENT RECONSTRUCTION WITH ALLOGRAPH;  Surgeon: Hiram Gash, MD;  Location: [place];  Service: Orthopedics;  Laterality: Right;  . KNEE SURGERY     x3    There were no vitals filed for this visit.  Subjective Assessment - 11/18/19 1238    Subjective  Patient reports compliance with HEP, has weaned from her brace. No falls or LOB.    Pertinent History  Patient is a pleasant 44 year old female who presents to physical therapy s/p Right revision MPFL reconstruction and patella chondroplasty  on 10/02/19 with graft chosen as a doubled over semitendinosus graft. Patient is to be WBAT in brace locked in extension. Brace is to be worn at all times except for PT and hygiene for 0-4 weeks. She saw the physician this morning. Patient has a history of MPFL  reconstruction with first surgery 1996 and second surgery 2004. 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    Limitations  Sitting;Lifting;Standing;Walking;House hold activities    How long can you sit comfortably?  requires use of immobilizer locked out at this time    How long can you stand comfortably?  WBAT with knee immobilizer locked    How long can you walk comfortably?  fatigues after 30 ft but is able to walk functional distances at this time.    Diagnostic tests  s/p surgery    Patient Stated Goals  to return to PLOF.    Currently in Pain?  No/denies              PROM/AROM flexion  Supine: 129 flexion  Seated: >120 flexion RLE strength adduction 3+/5 and hip flexion 3+/5 rest 4/5  LEFS: 58/100 6 minute walk test: 1250 ft ;  Squat equally : performed 4 x with slight modified and weight shift   treat:   Modified sit to stand RLE only from raised plinth table, tactile to outside of LLE performed with cueing for neutral alignment.  Contract relax; knee at 90 degrees into PT shoulder 10x with increasing knee flexion after each repetition    Mobilizations to proximal tib fit joint for increased knee flexion with passive knee flexion 20 second mobilizations for 8 repetitions Patellar mobilizations: grade I-II, superior, inferior, medial, lateral, 10 seconds x 5 trials each direction.   Ice cup massage 4 minutes   Seated: Contract relax against PT resistance 10x 5 second holds with progressive knee flexion  Patient's condition has the potential to improve in response to therapy. Maximum improvement is yet to be obtained. The anticipated improvement is attainable and reasonable in a generally predictable time.  Patient  reports her range of motion and strength in her leg is improving. She still walks with some gait deviations but is able to start doing stairs now   Patient presents to physical therapy with significant progress towards goal. She has met her functional active flexion goal with >120 degrees active knee flexion. Her strength is improving however quadriceps and adductors remain an area for improvement.  Patient's condition has the potential to improve in response to therapy. Maximum improvement is yet to be obtained. The anticipated improvement is attainable and reasonable in a generally predictable time.  She will benefit from skilled physical therapy to improve strength, range of motion, and mobility for return to PLOF while following protocol provided by surgeon.              PT Education - 11/18/19 1240    Education Details  exercise technique, body mechanics, goals, POC    Person(s) Educated  Patient    Methods  Explanation;Demonstration;Tactile cues;Verbal cues    Comprehension  Verbalized understanding;Returned demonstration;Verbal cues required;Tactile cues required       PT Short Term Goals - 11/18/19 1502      PT SHORT TERM GOAL #1   Title  Patient will be independent in home exercise program to improve strength/mobility for better functional independence with ADLs.    Baseline  11/24: HEP given 1/5: HEP compliant    Time  4    Period  Weeks    Status  Partially Met    Target Date  11/04/19      PT SHORT TERM GOAL #2   Title  Patient will demonstrate 100 degrees of flexion with active range of motion for improved gait mechanics and muscular activation/alignment    Baseline  11/24: 61 1/5: supine 129 seated >120    Time  4    Period  Weeks    Status  Achieved    Target Date  11/04/19        PT Long Term Goals - 11/18/19 1504      PT LONG TERM GOAL #1   Title  Patient will demonstrate >/=115 degrees of flexion with active range of motion for improved gait  mechanics and muscular activation/alignment and return to functional ROM    Baseline  11/24: 61 degrees 1/5: supine 129 degrees flexion, >120 degrees flexion seated    Time  8    Period  Weeks    Status  Achieved      PT LONG TERM GOAL #2   Title  Patient will increase BLE gross strength to 4+/5 as to improve functional strength for independent gait, increased standing tolerance and increased ADL ability.    Baseline  11/24: R hip grossly 3-/5 knee unable to be assessed 1/5: R adduction and hip flexion 3+/5, rest of LE 4/5    Time  8  Period  Weeks    Status  Partially Met    Target Date  12/02/19      PT LONG TERM GOAL #3   Title  Patient will increase lower extremity functional scale to >60/80 to demonstrate improved functional mobility and increased tolerance with ADLs.    Baseline  11/24: 36/80 1/5: 58/80    Time  8    Period  Weeks    Status  Partially Met    Target Date  12/02/19      PT LONG TERM GOAL #4   Title  Patient will perform the 6 minute walk test for >1011f with no episodes of hip hiking, vaulting, or antalgia for return to PLOF.    Baseline  11/24: unable to perform 1/5: 1250 ft    Time  8    Period  Weeks    Status  Achieved      PT LONG TERM GOAL #5   Title  Patient will perform a squat with equal weight shift and no LOB or increase of pain >1 degree on the VAS scale for functional activty performance and return to PLOF.    Baseline  11/24: unable to perform 1/5: modified squat with slight weight shift onto LLE    Time  8    Period  Weeks    Status  Partially Met    Target Date  12/02/19            Plan - 11/18/19 1512    Clinical Impression Statement  Patient presents to physical therapy with significant progress towards goal. She has met her functional active flexion goal with >120 degrees active knee flexion. Her strength is improving however quadriceps and adductors remain an area for improvement.  Patient's condition has the potential to  improve in response to therapy. Maximum improvement is yet to be obtained. The anticipated improvement is attainable and reasonable in a generally predictable time.  She will benefit from skilled physical therapy to improve strength, range of motion, and mobility for return to PLOF while following protocol provided by surgeon.    Personal Factors and Comorbidities  Comorbidity 1;Education;Past/Current Experience    Comorbidities  two previous MPFL surgeries, anemia    Examination-Activity Limitations  Bathing;Bed Mobility;Caring for Others;Carry;Stairs;Squat;Sleep;Sit;Locomotion Level;Lift;Stand;Transfers    Examination-Participation Restrictions  Cleaning;Community Activity;Meal Prep;Laundry;Shop;Yard Work    Stability/Clinical Decision Making  Stable/Uncomplicated    Rehab Potential  Excellent    PT Frequency  2x / week    PT Duration  8 weeks    PT Treatment/Interventions  ADLs/Self Care Home Management;Aquatic Therapy;Cryotherapy;Electrical Stimulation;Biofeedback;Iontophoresis '4mg'$ /ml Dexamethasone;Moist Heat;Ultrasound;Functional mobility training;Stair training;Gait training;DME Instruction;Therapeutic activities;Therapeutic exercise;Balance training;Neuromuscular re-education;Manual techniques;Patient/family education;Compression bandaging;Scar mobilization;Passive range of motion;Dry needling;Taping;Splinting;Energy conservation    PT Next Visit Plan  progress note, stair stretch, TRX    PT Home Exercise Plan  see above    Consulted and Agree with Plan of Care  Patient       Patient will benefit from skilled therapeutic intervention in order to improve the following deficits and impairments:  Abnormal gait, Decreased activity tolerance, Decreased endurance, Decreased mobility, Decreased range of motion, Difficulty walking, Decreased strength, Decreased scar mobility, Increased edema, Impaired flexibility, Impaired perceived functional ability, Increased fascial restricitons, Improper body  mechanics, Pain  Visit Diagnosis: Stiffness of right knee, not elsewhere classified  Other abnormalities of gait and mobility  Muscle weakness (generalized)     Problem List Patient Active Problem List   Diagnosis Date Noted  . Plant irritant contact dermatitis  06/17/2019  . Iron deficiency anemia 12/09/2018  . Preventative health care 12/04/2016  . Seasonal allergic rhinitis 11/27/2014    Janna Arch, PT, DPT   11/18/2019, 3:15 PM  [place] MAIN [facility] SERVICES [address] [place], [place], [address] Phone: [phone]   Fax:  [phone]  Name: Dawn Hamilton MRN: [id number] Date of Birth: 03/29/1976

## 2019-11-20 ENCOUNTER — Ambulatory Visit: Payer: 59

## 2019-11-20 ENCOUNTER — Other Ambulatory Visit: Payer: Self-pay

## 2019-11-20 DIAGNOSIS — M25661 Stiffness of right knee, not elsewhere classified: Secondary | ICD-10-CM

## 2019-11-20 DIAGNOSIS — M6281 Muscle weakness (generalized): Secondary | ICD-10-CM | POA: Diagnosis not present

## 2019-11-20 DIAGNOSIS — R2689 Other abnormalities of gait and mobility: Secondary | ICD-10-CM

## 2019-11-20 DIAGNOSIS — Z0000 Encounter for general adult medical examination without abnormal findings: Secondary | ICD-10-CM

## 2019-11-20 DIAGNOSIS — D509 Iron deficiency anemia, unspecified: Secondary | ICD-10-CM

## 2019-11-20 NOTE — Therapy (Signed)
[place] MAIN [facility] SERVICES [address] [place], [place], [address] Phone: [phone]   Fax:  [phone]  Physical Therapy Treatment  Patient Details  Name: Dawn Hamilton MRN: [id number] Date of Birth: 1976/07/16 Referring Provider (PT): Griffin Basil, dax T   Encounter Date: 11/20/2019  PT End of Session - 11/20/19 2056    Visit Number  11    Number of Visits  16    Date for PT Re-Evaluation  12/02/19    Authorization Type  1/10 PN 11/18/19    PT Start Time  1116    PT Stop Time  1159    PT Time Calculation (min)  43 min    Activity Tolerance  Patient tolerated treatment well    Behavior During Therapy  [facility] for tasks assessed/performed       Past Medical History:  Diagnosis Date  . Allergy   . Anxiety   . Depression   . Precancerous skin lesion     Past Surgical History:  Procedure Laterality Date  . KNEE ARTHROSCOPY WITH MEDIAL PATELLAR FEMORAL LIGAMENT RECONSTRUCTION Right 10/02/2019   Procedure: KNEE ARTHROSCOPY WITH MEDIAL PATELLAR FEMORAL LIGAMENT RECONSTRUCTION WITH ALLOGRAPH;  Surgeon: Hiram Gash, MD;  Location: [place];  Service: Orthopedics;  Laterality: Right;  . KNEE SURGERY     x3    There were no vitals filed for this visit.  Subjective Assessment - 11/20/19 2055    Subjective  Patient reports compliance with HEP, has occasional catch in knee with ambulation. No falls or LOB    Pertinent History  Patient is a pleasant 44 year old female who presents to physical therapy s/p Right revision MPFL reconstruction and patella chondroplasty  on 10/02/19 with graft chosen as a doubled over semitendinosus graft. Patient is to be WBAT in brace locked in extension. Brace is to be worn at all times except for PT and hygiene for 0-4 weeks. She saw the physician this morning. Patient has a history of MPFL reconstruction with first surgery 1996 and second surgery 2004. Other PMH includes: iron deficiency anemia  and surgery PMH includes: R roux ellsie trillant procedure 4/96, L lateral release 7/99, R medical retiniculum imbrication 2/04, c section 05/2009. 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    Limitations  Sitting;Lifting;Standing;Walking;House hold activities    How long can you sit comfortably?  requires use of immobilizer locked out at this time    How long can you stand comfortably?  WBAT with knee immobilizer locked    How long can you walk comfortably?  fatigues after 30 ft but is able to walk functional distances at this time.    Diagnostic tests  s/p surgery    Patient Stated Goals  to return to PLOF.    Currently in Pain?  No/denies        supine:  SLR 2lb ankle weight 10x neutral, 10x IR, 10x ER   Bridge: posterior pelvic tilt: gluteal squeze then lift 10x,  Bridge: modified single limb with LLE straight 10x Bridge: raised onto 4" step with GTB around thighs: bridge into top of bridge: abduct x1, return back to bridge   Patellar mobilizations: grade I-II, superior, inferior, medial, lateral, 10 seconds x 5 trials each direction.  Roller to R calf for reduction of trigger points x 6 minutes    Prone: Hamstring curl 2lb ankle weight 10x froggers 10x  TRX squat with chair behind patient for depth of squat, cueing for lengthening arms into extension at depth of squat, patient needs brief sit to stabilize self at bottom of each squat. 15x  TRX posterior lunge modified with focus on single limb stability; 10x each LE   TRX lateral lunge with focus on adductor stretch and stabilization 10x each side  Pt educated throughout session about proper posture and technique with exercises. Improved exercise technique, movement at target joints, use of target muscles after min to mod verbal, visual, tactile cues.                    PT Education - 11/20/19 2055    Education Details  exercise technique,  body mechanics,    Person(s) Educated  Patient    Methods  Explanation;Demonstration;Tactile cues;Verbal cues    Comprehension  Verbalized understanding;Returned demonstration;Verbal cues required;Tactile cues required       PT Short Term Goals - 11/18/19 1502      PT SHORT TERM GOAL #1   Title  Patient will be independent in home exercise program to improve strength/mobility for better functional independence with ADLs.    Baseline  11/24: HEP given 1/5: HEP compliant    Time  4    Period  Weeks    Status  Partially Met    Target Date  11/04/19      PT SHORT TERM GOAL #2   Title  Patient will demonstrate 100 degrees of flexion with active range of motion for improved gait mechanics and muscular activation/alignment    Baseline  11/24: 61 1/5: supine 129 seated >120    Time  4    Period  Weeks    Status  Achieved    Target Date  11/04/19        PT Long Term Goals - 11/18/19 1504      PT LONG TERM GOAL #1   Title  Patient will demonstrate >/=115 degrees of flexion with active range of motion for improved gait mechanics and muscular activation/alignment and return to functional ROM    Baseline  11/24: 61 degrees 1/5: supine 129 degrees flexion, >120 degrees flexion seated    Time  8    Period  Weeks    Status  Achieved      PT LONG TERM GOAL #2   Title  Patient will increase BLE gross strength to 4+/5 as to improve functional strength for independent gait, increased standing tolerance and increased ADL ability.    Baseline  11/24: R hip grossly 3-/5 knee unable to be assessed 1/5: R adduction and hip flexion 3+/5, rest of LE 4/5    Time  8    Period  Weeks    Status  Partially Met    Target Date  12/02/19      PT LONG TERM GOAL #3   Title  Patient will increase lower extremity functional scale to >60/80 to demonstrate improved functional mobility and increased tolerance with ADLs.    Baseline  11/24: 36/80 1/5: 58/80    Time  8    Period  Weeks    Status  Partially Met     Target Date  12/02/19      PT LONG TERM GOAL #4   Title  Patient will perform the 6 minute walk test for >1041f with no episodes of hip hiking, vaulting, or antalgia for return to PLOF.    Baseline  11/24: unable to perform 1/5: 1250 ft  Time  8    Period  Weeks    Status  Achieved      PT LONG TERM GOAL #5   Title  Patient will perform a squat with equal weight shift and no LOB or increase of pain >1 degree on the VAS scale for functional activty performance and return to PLOF.    Baseline  11/24: unable to perform 1/5: modified squat with slight weight shift onto LLE    Time  8    Period  Weeks    Status  Partially Met    Target Date  12/02/19            Plan - 11/20/19 2057    Clinical Impression Statement  Patient is progressing with strengthening interventions. Focused progression towards stabilization in single limb stability as well as ability to obtain neutral LE position. Patient continues to remain highly motivated throughout session. She will benefit from skilled physical therapy to improve strength, range of motion, and mobility for return to PLOF while following protocol provided by surgeon.    Personal Factors and Comorbidities  Comorbidity 1;Education;Past/Current Experience    Comorbidities  two previous MPFL surgeries, anemia    Examination-Activity Limitations  Bathing;Bed Mobility;Caring for Others;Carry;Stairs;Squat;Sleep;Sit;Locomotion Level;Lift;Stand;Transfers    Examination-Participation Restrictions  Cleaning;Community Activity;Meal Prep;Laundry;Shop;Yard Work    Stability/Clinical Decision Making  Stable/Uncomplicated    Rehab Potential  Excellent    PT Frequency  2x / week    PT Duration  8 weeks    PT Treatment/Interventions  ADLs/Self Care Home Management;Aquatic Therapy;Cryotherapy;Electrical Stimulation;Biofeedback;Iontophoresis '4mg'$ /ml Dexamethasone;Moist Heat;Ultrasound;Functional mobility training;Stair training;Gait training;DME  Instruction;Therapeutic activities;Therapeutic exercise;Balance training;Neuromuscular re-education;Manual techniques;Patient/family education;Compression bandaging;Scar mobilization;Passive range of motion;Dry needling;Taping;Splinting;Energy conservation    PT Next Visit Plan  progress note, stair stretch, TRX    PT Home Exercise Plan  see above    Consulted and Agree with Plan of Care  Patient       Patient will benefit from skilled therapeutic intervention in order to improve the following deficits and impairments:  Abnormal gait, Decreased activity tolerance, Decreased endurance, Decreased mobility, Decreased range of motion, Difficulty walking, Decreased strength, Decreased scar mobility, Increased edema, Impaired flexibility, Impaired perceived functional ability, Increased fascial restricitons, Improper body mechanics, Pain  Visit Diagnosis: Stiffness of right knee, not elsewhere classified  Other abnormalities of gait and mobility  Muscle weakness (generalized)     Problem List Patient Active Problem List   Diagnosis Date Noted  . Plant irritant contact dermatitis 06/17/2019  . Iron deficiency anemia 12/09/2018  . Preventative health care 12/04/2016  . Seasonal allergic rhinitis 11/27/2014   Janna Arch, PT, DPT   11/20/2019, 8:58 PM  [place] MAIN [facility] SERVICES [address] [place], [place], [address] Phone: [phone]   Fax:  [phone]  Name: Adasha Boehme MRN: [id number] Date of Birth: [birth year]

## 2019-11-25 ENCOUNTER — Other Ambulatory Visit: Payer: Self-pay

## 2019-11-25 ENCOUNTER — Ambulatory Visit: Payer: 59

## 2019-11-25 DIAGNOSIS — M25661 Stiffness of right knee, not elsewhere classified: Secondary | ICD-10-CM

## 2019-11-25 DIAGNOSIS — M6281 Muscle weakness (generalized): Secondary | ICD-10-CM

## 2019-11-25 DIAGNOSIS — R2689 Other abnormalities of gait and mobility: Secondary | ICD-10-CM | POA: Diagnosis not present

## 2019-11-25 NOTE — Therapy (Signed)
[place] MAIN [facility] SERVICES [address] [place], [place], [address] Phone: [phone]   Fax:  [phone]  Physical Therapy Treatment  Patient Details  Name: Dawn Hamilton MRN: [id number] Date of Birth: 1976/08/12 Referring Provider (PT): Griffin Basil, dax T   Encounter Date: 11/25/2019  PT End of Session - 11/25/19 1258    Visit Number  12    Number of Visits  16    Date for PT Re-Evaluation  12/02/19    Authorization Type  2/10 PN 11/18/19    PT Start Time  1115    PT Stop Time  1202    PT Time Calculation (min)  47 min    Activity Tolerance  Patient tolerated treatment well    Behavior During Therapy  [facility] for tasks assessed/performed       Past Medical History:  Diagnosis Date  . Allergy   . Anxiety   . Depression   . Precancerous skin lesion     Past Surgical History:  Procedure Laterality Date  . KNEE ARTHROSCOPY WITH MEDIAL PATELLAR FEMORAL LIGAMENT RECONSTRUCTION Right 10/02/2019   Procedure: KNEE ARTHROSCOPY WITH MEDIAL PATELLAR FEMORAL LIGAMENT RECONSTRUCTION WITH ALLOGRAPH;  Surgeon: Hiram Gash, MD;  Location: [place];  Service: Orthopedics;  Laterality: Right;  . KNEE SURGERY     x3    There were no vitals filed for this visit.  Subjective Assessment - 11/25/19 1253    Subjective  Patient reports having increased soreness in R knee after having a busy weekend pushing herself. No falls or sever pain, just ache.    Pertinent History  Patient is a pleasant 44 year old female who presents to physical therapy s/p Right revision MPFL reconstruction and patella chondroplasty  on 10/02/19 with graft chosen as a doubled over semitendinosus graft. Patient is to be WBAT in brace locked in extension. Brace is to be worn at all times except for PT and hygiene for 0-4 weeks. She saw the physician this morning. Patient has a history of MPFL reconstruction with first surgery 1996 and second surgery 2004. Other PMH  includes: iron deficiency anemia and surgery PMH includes: R roux ellsie trillant procedure 4/96, L lateral release 7/99, R medical retiniculum imbrication 2/04, c section 05/2009. 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    Limitations  Sitting;Lifting;Standing;Walking;House hold activities    How long can you sit comfortably?  requires use of immobilizer locked out at this time    How long can you stand comfortably?  WBAT with knee immobilizer locked    How long can you walk comfortably?  fatigues after 30 ft but is able to walk functional distances at this time.    Diagnostic tests  s/p surgery    Patient Stated Goals  to return to PLOF.    Currently in Pain?  Yes    Pain Score  3     Pain Location  Leg    Pain Orientation  Right    Pain Descriptors / Indicators  Aching    Pain Type  Surgical pain    Pain Onset  More than a month ago    Pain Frequency  Intermittent        Treatment: Octane fitness: Lvl 2 5 minutes     Patellar mobilizations: grade I-II, superior, inferior, medial, lateral, 10 seconds x 5 trials each direction.  Hamstring stretch with adduction bias 60 second  hold with leg on PT shoulder  GTB bridges with cueing for arms crossed, posterior pelvic tilt, gluteal squeeze and lift 12x.     TRX squat with chair behind patient for depth of squat, cueing for lengthening arms into extension at depth of squat, patient needs brief sit to stabilize self at bottom of each squat. 10x first set, second set patient does not require use of chair 10x.   TRX lateral lunge with focus on adductor stretch and stabilization 10x each side x2 trials; cueing for additional lateral movement.   Leg press: single leg RLE: #30 : tactile cueing with verbal cues for reduction of hyperextension with knee extension and slow eccentric/concentric phase ( 3 second each)   Bilateral LE leg press: #90; rainbow ball between feet 10x ,  2 sets   sidelying RLE adduction with 2.5 ankle weight 10x, second set 10x with 5 second pulse, 10x with adduction then flexion into extension for a total of 3 sets total.   sidelying RLE abduction: 2.5 ankle weight 10x, forward and lateral movement 10x, for 2 sets total.    Quadruped:  childpose to quadruped rocking stretch 8x with prolonged hold for increased knee flexion Quadruped hip extension cueing for abdominal activation 8x each side  Roller to R calf with noted sphere in calf musculature x 3 minutes  Ice cup massage to R knee    Pt educated throughout session about proper posture and technique with exercises. Improved exercise technique, movement at target joints, use of target muscles after min to mod verbal, visual, tactile cues.                  PT Education - 11/25/19 1257    Education Details  exercise technique, body mechanics    Person(s) Educated  Patient    Methods  Explanation;Demonstration;Tactile cues;Verbal cues    Comprehension  Verbalized understanding;Returned demonstration;Verbal cues required;Tactile cues required       PT Short Term Goals - 11/18/19 1502      PT SHORT TERM GOAL #1   Title  Patient will be independent in home exercise program to improve strength/mobility for better functional independence with ADLs.    Baseline  11/24: HEP given 1/5: HEP compliant    Time  4    Period  Weeks    Status  Partially Met    Target Date  11/04/19      PT SHORT TERM GOAL #2   Title  Patient will demonstrate 100 degrees of flexion with active range of motion for improved gait mechanics and muscular activation/alignment    Baseline  11/24: 61 1/5: supine 129 seated >120    Time  4    Period  Weeks    Status  Achieved    Target Date  11/04/19        PT Long Term Goals - 11/18/19 1504      PT LONG TERM GOAL #1   Title  Patient will demonstrate >/=115 degrees of flexion with active range of motion for improved gait mechanics and  muscular activation/alignment and return to functional ROM    Baseline  11/24: 61 degrees 1/5: supine 129 degrees flexion, >120 degrees flexion seated    Time  8    Period  Weeks    Status  Achieved      PT LONG TERM GOAL #2   Title  Patient will increase BLE gross strength to 4+/5 as to improve functional strength for independent gait, increased standing tolerance  and increased ADL ability.    Baseline  11/24: R hip grossly 3-/5 knee unable to be assessed 1/5: R adduction and hip flexion 3+/5, rest of LE 4/5    Time  8    Period  Weeks    Status  Partially Met    Target Date  12/02/19      PT LONG TERM GOAL #3   Title  Patient will increase lower extremity functional scale to >60/80 to demonstrate improved functional mobility and increased tolerance with ADLs.    Baseline  11/24: 36/80 1/5: 58/80    Time  8    Period  Weeks    Status  Partially Met    Target Date  12/02/19      PT LONG TERM GOAL #4   Title  Patient will perform the 6 minute walk test for >1087f with no episodes of hip hiking, vaulting, or antalgia for return to PLOF.    Baseline  11/24: unable to perform 1/5: 1250 ft    Time  8    Period  Weeks    Status  Achieved      PT LONG TERM GOAL #5   Title  Patient will perform a squat with equal weight shift and no LOB or increase of pain >1 degree on the VAS scale for functional activty performance and return to PLOF.    Baseline  11/24: unable to perform 1/5: modified squat with slight weight shift onto LLE    Time  8    Period  Weeks    Status  Partially Met    Target Date  12/02/19            Plan - 11/25/19 1301    Clinical Impression Statement  Patient is progressing with functional strength and mobility of R knee with decreasing compensatory body mechanics. Patient challenged with stabilization of single limb stability with prolonged hold. She will benefit from skilled physical therapy to improve strength, range of motion, and mobility for return to PLOF  while following protocol provided by surgeon.    Personal Factors and Comorbidities  Comorbidity 1;Education;Past/Current Experience    Comorbidities  two previous MPFL surgeries, anemia    Examination-Activity Limitations  Bathing;Bed Mobility;Caring for Others;Carry;Stairs;Squat;Sleep;Sit;Locomotion Level;Lift;Stand;Transfers    Examination-Participation Restrictions  Cleaning;Community Activity;Meal Prep;Laundry;Shop;Yard Work    Stability/Clinical Decision Making  Stable/Uncomplicated    Rehab Potential  Excellent    PT Frequency  2x / week    PT Duration  8 weeks    PT Treatment/Interventions  ADLs/Self Care Home Management;Aquatic Therapy;Cryotherapy;Electrical Stimulation;Biofeedback;Iontophoresis '4mg'$ /ml Dexamethasone;Moist Heat;Ultrasound;Functional mobility training;Stair training;Gait training;DME Instruction;Therapeutic activities;Therapeutic exercise;Balance training;Neuromuscular re-education;Manual techniques;Patient/family education;Compression bandaging;Scar mobilization;Passive range of motion;Dry needling;Taping;Splinting;Energy conservation    PT Next Visit Plan  progress note, stair stretch, TRX    PT Home Exercise Plan  see above    Consulted and Agree with Plan of Care  Patient       Patient will benefit from skilled therapeutic intervention in order to improve the following deficits and impairments:  Abnormal gait, Decreased activity tolerance, Decreased endurance, Decreased mobility, Decreased range of motion, Difficulty walking, Decreased strength, Decreased scar mobility, Increased edema, Impaired flexibility, Impaired perceived functional ability, Increased fascial restricitons, Improper body mechanics, Pain  Visit Diagnosis: Stiffness of right knee, not elsewhere classified  Other abnormalities of gait and mobility  Muscle weakness (generalized)     Problem List Patient Active Problem List   Diagnosis Date Noted  . Plant irritant contact dermatitis  06/17/2019  .  Iron deficiency anemia 12/09/2018  . Preventative health care 12/04/2016  . Seasonal allergic rhinitis 11/27/2014   Janna Arch, PT, DPT   11/25/2019, 1:02 PM  [place] MAIN [facility] SERVICES [address] [place], [place], [address] Phone: [phone]   Fax:  [phone]  Name: Mya Suell MRN: [id number] Date of Birth: July 16, 1976

## 2019-11-27 ENCOUNTER — Other Ambulatory Visit: Payer: Self-pay

## 2019-11-27 ENCOUNTER — Ambulatory Visit: Payer: 59

## 2019-11-27 DIAGNOSIS — M25661 Stiffness of right knee, not elsewhere classified: Secondary | ICD-10-CM

## 2019-11-27 DIAGNOSIS — R2689 Other abnormalities of gait and mobility: Secondary | ICD-10-CM | POA: Diagnosis not present

## 2019-11-27 DIAGNOSIS — M6281 Muscle weakness (generalized): Secondary | ICD-10-CM

## 2019-11-27 NOTE — Therapy (Signed)
[place] MAIN [facility] SERVICES [address] [place], [place], [address] Phone: [phone]   Fax:  [phone]  Physical Therapy Treatment/RECERT  Patient Details  Name: Dawn Hamilton MRN: [id number] Date of Birth: Mar 28, 1976 Referring Provider (PT): Griffin Basil, dax T   Encounter Date: 11/27/2019  PT End of Session - 11/27/19 1118    Visit Number  13    Number of Visits  29    Date for PT Re-Evaluation  01/22/20    Authorization Type  3/10 PN 11/18/19    PT Start Time  1114    PT Stop Time  1200    PT Time Calculation (min)  46 min    Activity Tolerance  Patient tolerated treatment well    Behavior During Therapy  [facility] for tasks assessed/performed       Past Medical History:  Diagnosis Date  . Allergy   . Anxiety   . Depression   . Precancerous skin lesion     Past Surgical History:  Procedure Laterality Date  . KNEE ARTHROSCOPY WITH MEDIAL PATELLAR FEMORAL LIGAMENT RECONSTRUCTION Right 10/02/2019   Procedure: KNEE ARTHROSCOPY WITH MEDIAL PATELLAR FEMORAL LIGAMENT RECONSTRUCTION WITH ALLOGRAPH;  Surgeon: Hiram Gash, MD;  Location: [place];  Service: Orthopedics;  Laterality: Right;  . KNEE SURGERY     x3    There were no vitals filed for this visit.  Subjective Assessment - 11/27/19 1115    Subjective  Patient reports compliance with HEP, progressing with HEP. No falls or LOB since last session. Was able to do TRX yesterday.    Pertinent History  Patient is a pleasant 44 year old female who presents to physical therapy s/p Right revision MPFL reconstruction and patella chondroplasty  on 10/02/19 with graft chosen as a doubled over semitendinosus graft. Patient is to be WBAT in brace locked in extension. Brace is to be worn at all times except for PT and hygiene for 0-4 weeks. She saw the physician this morning. Patient has a history of MPFL reconstruction with first surgery 1996 and second surgery 2004. Other PMH  includes: iron deficiency anemia and surgery PMH includes: R roux ellsie trillant procedure 4/96, L lateral release 7/99, R medical retiniculum imbrication 2/04, c section 05/2009. 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    Limitations  Sitting;Lifting;Standing;Walking;House hold activities    How long can you sit comfortably?  requires use of immobilizer locked out at this time    How long can you stand comfortably?  WBAT with knee immobilizer locked    How long can you walk comfortably?  fatigues after 30 ft but is able to walk functional distances at this time.    Diagnostic tests  s/p surgery    Patient Stated Goals  to return to PLOF.    Currently in Pain?  Yes    Pain Score  3     Pain Location  Leg    Pain Orientation  Right    Pain Descriptors / Indicators  Aching    Pain Type  Surgical pain    Pain Onset  More than a month ago    Pain Frequency  Intermittent         Treatment: Octane fitness 5 minutes Level 5; 2.5 min forward, 2.5 minute backwards  Treadmill : interval incline:2.8 -3.0 mps ; interval 1-1:30 0% incline, 130-230 incline 1% , 230 to 330 0% 330  to 430 2% 430 to 5 0%  Supine:  patellar mobilizations: anterior/inferior medial lateral 10 second holds 10x  Ice cup massage 3 minutes  Prone: Stick hamstring rollout 2 minutes  Metal tool IASTM R posterior aspect of knee for hamstring and calf musculature x 12 minutes, multiple aspects of trigger points and limited muscle tissue length.   Circuit 1:3 sets with improved coordination/sequencing.   Exercise 1: backwards stool scoot 14 ft with focus on neutral foot alignment/knee alignment for optimal muscle recruitment Exercise 2: forward scoot pull 14 ft; cueing for heel strike for optimal hamstring curl Lateral side stepping 25 ft x2 trials each LE; cueing for depth of squat  Standing Knee extension GTB; 10x with surgical limb posterior, 10x with  surgical limb anterior        Pt educated throughout session about proper posture and technique with exercises. Improved exercise technique, movement at target joints, use of target muscles after min to mod verbal, visual, tactile cues.                      PT Education - 11/27/19 1117    Education Details  goals, POC, exercise technique    Person(s) Educated  Patient    Methods  Explanation;Demonstration;Tactile cues;Verbal cues    Comprehension  Verbalized understanding;Returned demonstration;Verbal cues required;Tactile cues required       PT Short Term Goals - 11/28/19 7062      PT SHORT TERM GOAL #1   Title  Patient will be independent in home exercise program to improve strength/mobility for better functional independence with ADLs.    Baseline  11/24: HEP given 1/5: HEP compliant 1/15: HEP compliant    Time  4    Period  Weeks    Status  Achieved    Target Date  11/04/19      PT SHORT TERM GOAL #2   Title  Patient will demonstrate 100 degrees of flexion with active range of motion for improved gait mechanics and muscular activation/alignment    Baseline  11/24: 61 1/5: supine 129 seated >120    Time  4    Period  Weeks    Status  Achieved    Target Date  11/04/19        PT Long Term Goals - 11/28/19 0821      PT LONG TERM GOAL #1   Title  Patient will demonstrate >/=115 degrees of flexion with active range of motion for improved gait mechanics and muscular activation/alignment and return to functional ROM    Baseline  11/24: 61 degrees 1/5: supine 129 degrees flexion, >120 degrees flexion seated    Time  8    Period  Weeks    Status  Achieved      PT LONG TERM GOAL #2   Title  Patient will increase BLE gross strength to 4+/5 as to improve functional strength for independent gait, increased standing tolerance and increased ADL ability.    Baseline  11/24: R hip grossly 3-/5 knee unable to be assessed 1/5: R adduction and hip flexion 3+/5, rest of  LE 4/5    Time  8    Period  Weeks    Status  Partially Met    Target Date  01/22/20      PT LONG TERM GOAL #3   Title  Patient will increase lower extremity functional scale to >60/80 to demonstrate improved functional mobility and increased tolerance with ADLs.    Baseline  11/24: 36/80 1/5: 58/80    Time  8    Period  Weeks    Status  Partially Met    Target Date  01/22/20      PT LONG TERM GOAL #4   Title  Patient will perform the 6 minute walk test for >1046f with no episodes of hip hiking, vaulting, or antalgia for return to PLOF.    Baseline  11/24: unable to perform 1/5: 1250 ft    Time  8    Period  Weeks    Status  Achieved      PT LONG TERM GOAL #5   Title  Patient will perform a squat with equal weight shift and no LOB or increase of pain >1 degree on the VAS scale for functional activty performance and return to PLOF.    Baseline  11/24: unable to perform 1/5: modified squat with slight weight shift onto LLE    Time  8    Period  Weeks    Status  Partially Met    Target Date  01/22/20      Additional Long Term Goals   Additional Long Term Goals  Yes      PT LONG TERM GOAL #6   Title  Patient will perform the 6 minute walk test for >1809 ft with no episodes of hip hiking, vaulting, or antalgia for return to PLOF to be within functional norms for age.    Baseline  1/5: 1250 ft, antalgic gait pattern    Time  8    Period  Weeks    Status  New    Target Date  01/22/20      PT LONG TERM GOAL #7   Title  Patient will tolerate 20 seconds of single leg stance without loss of balance to improve ability to get in and out of shower safely, return to gym activities, and return to PLOF.    Baseline  limited tolerance of 10 seconds    Time  8    Period  Weeks    Status  New    Target Date  01/22/20            Plan - 11/28/19 0849    Clinical Impression Statement  Patient has demonstrated excellent progression towards goals, please refer to note from 11/18/19 for  details. Additional progression of 6 MWT and new goal of single limb stance demonstrates patient's excellent status s/p surgery. Patient continues to remain highly motivated and progress with functional strength and stability of post surgical limb. She will benefit from skilled physical therapy to improve strength, range of motion, and mobility for return to PLOF while following protocol provided by surgeon    Personal Factors and Comorbidities  Comorbidity 1;Education;Past/Current Experience    Comorbidities  two previous MPFL surgeries, anemia    Examination-Activity Limitations  Bathing;Bed Mobility;Caring for Others;Carry;Stairs;Squat;Sleep;Sit;Locomotion Level;Lift;Stand;Transfers    Examination-Participation Restrictions  Cleaning;Community Activity;Meal Prep;Laundry;Shop;Yard Work    Stability/Clinical Decision Making  Stable/Uncomplicated    Rehab Potential  Excellent    PT Frequency  2x / week    PT Duration  8 weeks    PT Treatment/Interventions  ADLs/Self Care Home Management;Aquatic Therapy;Cryotherapy;Electrical Stimulation;Biofeedback;Iontophoresis '4mg'$ /ml Dexamethasone;Moist Heat;Ultrasound;Functional mobility training;Stair training;Gait training;DME Instruction;Therapeutic activities;Therapeutic exercise;Balance training;Neuromuscular re-education;Manual techniques;Patient/family education;Compression bandaging;Scar mobilization;Passive range of motion;Dry needling;Taping;Splinting;Energy conservation    PT Next Visit Plan  sls , strengthening    PT Home Exercise Plan  see above    Consulted and Agree with Plan of Care  Patient       Patient will benefit from skilled therapeutic intervention in order to improve the following deficits and impairments:  Abnormal gait, Decreased activity tolerance, Decreased endurance, Decreased mobility, Decreased range of motion, Difficulty walking, Decreased strength, Decreased scar mobility, Increased edema, Impaired flexibility, Impaired perceived  functional ability, Increased fascial restricitons, Improper body mechanics, Pain  Visit Diagnosis: Stiffness of right knee, not elsewhere classified  Other abnormalities of gait and mobility  Muscle weakness (generalized)     Problem List Patient Active Problem List   Diagnosis Date Noted  . Plant irritant contact dermatitis 06/17/2019  . Iron deficiency anemia 12/09/2018  . Preventative health care 12/04/2016  . Seasonal allergic rhinitis 11/27/2014   Janna Arch, PT, DPT   11/28/2019, 8:51 AM  [place] MAIN [facility] SERVICES [address] [place], [place], [address] Phone: [phone]   Fax:  [phone]  Name: Briane Birden MRN: [id number] Date of Birth: 18-Oct-1976

## 2019-12-02 ENCOUNTER — Ambulatory Visit: Payer: 59

## 2019-12-04 ENCOUNTER — Other Ambulatory Visit: Payer: Self-pay

## 2019-12-04 ENCOUNTER — Ambulatory Visit: Payer: 59

## 2019-12-04 DIAGNOSIS — M25661 Stiffness of right knee, not elsewhere classified: Secondary | ICD-10-CM

## 2019-12-04 DIAGNOSIS — M6281 Muscle weakness (generalized): Secondary | ICD-10-CM

## 2019-12-04 DIAGNOSIS — R2689 Other abnormalities of gait and mobility: Secondary | ICD-10-CM

## 2019-12-04 NOTE — Therapy (Signed)
[place] MAIN [facility] SERVICES [address] [place], [place], [address] Phone: [phone]   Fax:  [phone]  Physical Therapy Treatment  Patient Details  Name: Dawn Hamilton MRN: [id number] Date of Birth: 03/18/1976 Referring Provider (PT): Griffin Basil, dax T   Encounter Date: 12/04/2019  PT End of Session - 12/04/19 1255    Visit Number  14    Number of Visits  29    Date for PT Re-Evaluation  01/22/20    Authorization Type  4/10 PN 11/18/19    PT Start Time  1116    PT Stop Time  1157    PT Time Calculation (min)  41 min    Activity Tolerance  Patient tolerated treatment well    Behavior During Therapy  [facility] for tasks assessed/performed       Past Medical History:  Diagnosis Date  . Allergy   . Anxiety   . Depression   . Precancerous skin lesion     Past Surgical History:  Procedure Laterality Date  . KNEE ARTHROSCOPY WITH MEDIAL PATELLAR FEMORAL LIGAMENT RECONSTRUCTION Right 10/02/2019   Procedure: KNEE ARTHROSCOPY WITH MEDIAL PATELLAR FEMORAL LIGAMENT RECONSTRUCTION WITH ALLOGRAPH;  Surgeon: Hiram Gash, MD;  Location: [place];  Service: Orthopedics;  Laterality: Right;  . KNEE SURGERY     x3    There were no vitals filed for this visit.  Subjective Assessment - 12/04/19 1210    Subjective  Patient reports compliance with HEP, overdid it yesterday moving objects around her office. No falls or pain.    Pertinent History  Patient is a pleasant 44 year old female who presents to physical therapy s/p Right revision MPFL reconstruction and patella chondroplasty  on 10/02/19 with graft chosen as a doubled over semitendinosus graft. Patient is to be WBAT in brace locked in extension. Brace is to be worn at all times except for PT and hygiene for 0-4 weeks. She saw the physician this morning. Patient has a history of MPFL reconstruction with first surgery 1996 and second surgery 2004. Other PMH includes: iron  deficiency anemia and surgery PMH includes: R roux ellsie trillant procedure 4/96, L lateral release 7/99, R medical retiniculum imbrication 2/04, c section 05/2009. 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    Limitations  Sitting;Lifting;Standing;Walking;House hold activities    How long can you sit comfortably?  requires use of immobilizer locked out at this time    How long can you stand comfortably?  WBAT with knee immobilizer locked    How long can you walk comfortably?  fatigues after 30 ft but is able to walk functional distances at this time.    Diagnostic tests  s/p surgery    Patient Stated Goals  to return to PLOF.    Currently in Pain?  No/denies    Pain Onset  --          Patient reports overpressure    Supine:  patellar mobilizations: anterior/inferior medial lateral 10 second holds 10x    Prone: Stick hamstring rollout 2 minutes  Metal tool IASTM R posterior aspect of knee for hamstring and calf musculature x 10 minutes, multiple aspects of trigger points and limited muscle tissue length.    Standing:   squat monster walks with YTB around knees; 30 ft forwards, 30 ft backwards x3 trials each direction. Cueing for increased weight shift onto RLE  TKE GTB posterior  leg 12x, anterior leg 12x RLE only   RTB standing abduction with occasional single limb stability 12x each LE   RTB standing hip extension: 12x each LE  Eccentric heel tap/hip hike with single limb support RLE  Seated adduction 15x GTB    Without shoe: -RLE on airex pad: static stand single limb 25 seconds with occasional taps to support chair for stabilization x 2 -RLE on airex pad: forward, lateral, backward taps with LLE x10 -crane taps to pick up cone with RLE on airex pad.    Pt educated throughout session about proper posture and technique with exercises. Improved exercise technique, movement at target joints, use of target  muscles after min to mod verbal, visual, tactile cues.                 PT Education - 12/04/19 1254    Education Details  exercise technique, body mechanics,    Person(s) Educated  Patient    Methods  Explanation;Demonstration;Tactile cues;Verbal cues    Comprehension  Verbalized understanding;Returned demonstration;Verbal cues required;Tactile cues required       PT Short Term Goals - 11/28/19 0258      PT SHORT TERM GOAL #1   Title  Patient will be independent in home exercise program to improve strength/mobility for better functional independence with ADLs.    Baseline  11/24: HEP given 1/5: HEP compliant 1/15: HEP compliant    Time  4    Period  Weeks    Status  Achieved    Target Date  11/04/19      PT SHORT TERM GOAL #2   Title  Patient will demonstrate 100 degrees of flexion with active range of motion for improved gait mechanics and muscular activation/alignment    Baseline  11/24: 61 1/5: supine 129 seated >120    Time  4    Period  Weeks    Status  Achieved    Target Date  11/04/19        PT Long Term Goals - 11/28/19 0821      PT LONG TERM GOAL #1   Title  Patient will demonstrate >/=115 degrees of flexion with active range of motion for improved gait mechanics and muscular activation/alignment and return to functional ROM    Baseline  11/24: 61 degrees 1/5: supine 129 degrees flexion, >120 degrees flexion seated    Time  8    Period  Weeks    Status  Achieved      PT LONG TERM GOAL #2   Title  Patient will increase BLE gross strength to 4+/5 as to improve functional strength for independent gait, increased standing tolerance and increased ADL ability.    Baseline  11/24: R hip grossly 3-/5 knee unable to be assessed 1/5: R adduction and hip flexion 3+/5, rest of LE 4/5    Time  8    Period  Weeks    Status  Partially Met    Target Date  01/22/20      PT LONG TERM GOAL #3   Title  Patient will increase lower extremity functional scale to >60/80  to demonstrate improved functional mobility and increased tolerance with ADLs.    Baseline  11/24: 36/80 1/5: 58/80    Time  8    Period  Weeks    Status  Partially Met    Target Date  01/22/20      PT LONG TERM GOAL #4   Title  Patient will perform the 6  minute walk test for >106f with no episodes of hip hiking, vaulting, or antalgia for return to PLOF.    Baseline  11/24: unable to perform 1/5: 1250 ft    Time  8    Period  Weeks    Status  Achieved      PT LONG TERM GOAL #5   Title  Patient will perform a squat with equal weight shift and no LOB or increase of pain >1 degree on the VAS scale for functional activty performance and return to PLOF.    Baseline  11/24: unable to perform 1/5: modified squat with slight weight shift onto LLE    Time  8    Period  Weeks    Status  Partially Met    Target Date  01/22/20      Additional Long Term Goals   Additional Long Term Goals  Yes      PT LONG TERM GOAL #6   Title  Patient will perform the 6 minute walk test for >1809 ft with no episodes of hip hiking, vaulting, or antalgia for return to PLOF to be within functional norms for age.    Baseline  1/5: 1250 ft, antalgic gait pattern    Time  8    Period  Weeks    Status  New    Target Date  01/22/20      PT LONG TERM GOAL #7   Title  Patient will tolerate 20 seconds of single leg stance without loss of balance to improve ability to get in and out of shower safely, return to gym activities, and return to PLOF.    Baseline  limited tolerance of 10 seconds    Time  8    Period  Weeks    Status  New    Target Date  01/22/20            Plan - 12/04/19 1403    Clinical Impression Statement  Patient presents with excellent motivation and progression towards goals. She is challenged with single limb stability and unstable support surfaces without shoes resulting in occasional need for UE stabilization. Continued strengthening progression is noted with decreased fatigue and  improved capacity for task sequencing noted. She will benefit from skilled physical therapy to improve strength, range of motion, and mobility for return to PLOF while following protocol provided by surgeon    Personal Factors and Comorbidities  Comorbidity 1;Education;Past/Current Experience    Comorbidities  two previous MPFL surgeries, anemia    Examination-Activity Limitations  Bathing;Bed Mobility;Caring for Others;Carry;Stairs;Squat;Sleep;Sit;Locomotion Level;Lift;Stand;Transfers    Examination-Participation Restrictions  Cleaning;Community Activity;Meal Prep;Laundry;Shop;Yard Work    Stability/Clinical Decision Making  Stable/Uncomplicated    Rehab Potential  Excellent    PT Frequency  2x / week    PT Duration  8 weeks    PT Treatment/Interventions  ADLs/Self Care Home Management;Aquatic Therapy;Cryotherapy;Electrical Stimulation;Biofeedback;Iontophoresis '4mg'$ /ml Dexamethasone;Moist Heat;Ultrasound;Functional mobility training;Stair training;Gait training;DME Instruction;Therapeutic activities;Therapeutic exercise;Balance training;Neuromuscular re-education;Manual techniques;Patient/family education;Compression bandaging;Scar mobilization;Passive range of motion;Dry needling;Taping;Splinting;Energy conservation    PT Next Visit Plan  sls , strengthening    PT Home Exercise Plan  see above    Consulted and Agree with Plan of Care  Patient       Patient will benefit from skilled therapeutic intervention in order to improve the following deficits and impairments:  Abnormal gait, Decreased activity tolerance, Decreased endurance, Decreased mobility, Decreased range of motion, Difficulty walking, Decreased strength, Decreased scar mobility, Increased edema, Impaired flexibility, Impaired perceived functional ability, Increased fascial  restricitons, Improper body mechanics, Pain  Visit Diagnosis: Stiffness of right knee, not elsewhere classified  Other abnormalities of gait and  mobility  Muscle weakness (generalized)     Problem List Patient Active Problem List   Diagnosis Date Noted  . Plant irritant contact dermatitis 06/17/2019  . Iron deficiency anemia 12/09/2018  . Preventative health care 12/04/2016  . Seasonal allergic rhinitis 11/27/2014   Janna Arch, PT, DPT   12/04/2019, 2:06 PM  [place] MAIN [facility] SERVICES [address] [place], [place], [address] Phone: [phone]   Fax:  [phone]  Name: Dawn Hamilton MRN: [id number] Date of Birth: 1976-10-21

## 2019-12-09 ENCOUNTER — Ambulatory Visit: Payer: 59

## 2019-12-09 ENCOUNTER — Other Ambulatory Visit: Payer: Self-pay

## 2019-12-09 DIAGNOSIS — M6281 Muscle weakness (generalized): Secondary | ICD-10-CM

## 2019-12-09 DIAGNOSIS — M25661 Stiffness of right knee, not elsewhere classified: Secondary | ICD-10-CM

## 2019-12-09 DIAGNOSIS — R2689 Other abnormalities of gait and mobility: Secondary | ICD-10-CM

## 2019-12-09 NOTE — Therapy (Signed)
[place] MAIN [facility] SERVICES [address] [place], [place], [address] Phone: [phone]   Fax:  [phone]  Physical Therapy Treatment  Patient Details  Name: Dawn Hamilton MRN: [id number] Date of Birth: 05/09/1976 Referring Provider (PT): Griffin Basil, dax T   Encounter Date: 12/09/2019  PT End of Session - 12/09/19 1257    Visit Number  15    Number of Visits  29    Date for PT Re-Evaluation  01/22/20    Authorization Type  5/10 PN 11/18/19    PT Start Time  1116    PT Stop Time  1158    PT Time Calculation (min)  42 min    Activity Tolerance  Patient tolerated treatment well    Behavior During Therapy  [facility] for tasks assessed/performed       Past Medical History:  Diagnosis Date  . Allergy   . Anxiety   . Depression   . Precancerous skin lesion     Past Surgical History:  Procedure Laterality Date  . KNEE ARTHROSCOPY WITH MEDIAL PATELLAR FEMORAL LIGAMENT RECONSTRUCTION Right 10/02/2019   Procedure: KNEE ARTHROSCOPY WITH MEDIAL PATELLAR FEMORAL LIGAMENT RECONSTRUCTION WITH ALLOGRAPH;  Surgeon: Hiram Gash, MD;  Location: [place];  Service: Orthopedics;  Laterality: Right;  . KNEE SURGERY     x3    There were no vitals filed for this visit.    Therapeutic Exercise: -Treadmill jog 4 minutes, 1 minute walking, to assess how pt was jogging outside of PT for safety and to reduce risk of injury. Attention given on need for weight shift onto post surgical limb for equal stride length as well as gluteal activation. -R Single leg Bosu balance with flat side up, 45 seconds, for single leg stability -Bosu squat with flat side up, 10x, to ensure equal weight distribution of each LE -Bosu ball (round side up) step ups into crane lunge: RLE 10x, very challenging with finger tips on wall for stability -TRX lateral lunges with shuffle between alternating sides, 10x each side, for slight plyometric activity and  strength -Cable side steps in squat position, 2 sets of 6 steps out and 6 steps of each: facing left, right, front, and back -Single leg RDL with 5# dumbbells in each hand, 10x each LE, cueing for proper knee bend and hip hinge, for single leg stability and strength -Wall squat with SB behind back, knee flexion to 90 degrees: 10x with rainbow ball adduction, 10x with RTB abduction -6" side step ups with opposite leg straight-leg abduction at top, 10x each LE -Calf raises: bilateral leg concentric, LLE slow eccentric for single leg, 10x -Bilateral calf raises on 6" step,10x , to challenge ankle and knee stability through greater ROM -Squats with BTB on LLE TKE, 10x to encourage quad activation through movement  -R knee ice cup massage, 5 minutes, to reduce any inflammation from exercise and pain tolerance  Pt presents with excellent motivation and is progressing well with LE strength. She continues to be challenged by single leg stability, but it has improved significantly since beginning PT. Pt tolerated exercises on unstable surface well today, with minimal need for UE to stabilize. Pt will continue to benefit from skilled physical therapy to improve LE strength and stability for return to PLOF while following protocol provided by surgeon.   Subjective Assessment - 12/09/19 1253    Subjective  Patient reports jogging/walking on treadmill for about five minutes this past week and that it caused  no pain or aggrevation.    Pertinent History  Patient is a pleasant 44 year old female who presents to physical therapy s/p Right revision MPFL reconstruction and patella chondroplasty  on 10/02/19 with graft chosen as a doubled over semitendinosus graft. Patient is to be WBAT in brace locked in extension. Brace is to be worn at all times except for PT and hygiene for 0-4 weeks. She saw the physician this morning. Patient has a history of MPFL reconstruction with first surgery 1996 and second surgery 2004.  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    Limitations  Sitting;Lifting;Standing;Walking;House hold activities    How long can you sit comfortably?  requires use of immobilizer locked out at this time    How long can you stand comfortably?  WBAT with knee immobilizer locked    How long can you walk comfortably?  fatigues after 30 ft but is able to walk functional distances at this time.    Diagnostic tests  s/p surgery    Patient Stated Goals  to return to PLOF.    Currently in Pain?  No/denies    Pain Score  0-No pain    Pain Onset  More than a month ago                               PT Education - 12/09/19 1111    Education Details  exercise technique, body mechanics    Person(s) Educated  Patient    Methods  Explanation;Demonstration;Tactile cues;Verbal cues    Comprehension  Verbalized understanding;Returned demonstration;Verbal cues required;Tactile cues required       PT Short Term Goals - 11/28/19 8657      PT SHORT TERM GOAL #1   Title  Patient will be independent in home exercise program to improve strength/mobility for better functional independence with ADLs.    Baseline  11/24: HEP given 1/5: HEP compliant 1/15: HEP compliant    Time  4    Period  Weeks    Status  Achieved    Target Date  11/04/19      PT SHORT TERM GOAL #2   Title  Patient will demonstrate 100 degrees of flexion with active range of motion for improved gait mechanics and muscular activation/alignment    Baseline  11/24: 61 1/5: supine 129 seated >120    Time  4    Period  Weeks    Status  Achieved    Target Date  11/04/19        PT Long Term Goals - 11/28/19 0821      PT LONG TERM GOAL #1   Title  Patient will  demonstrate >/=115 degrees of flexion with active range of motion for improved gait mechanics and muscular activation/alignment and return to functional ROM    Baseline  11/24: 61 degrees 1/5: supine 129 degrees flexion, >120 degrees flexion seated    Time  8    Period  Weeks    Status  Achieved      PT LONG TERM GOAL #2   Title  Patient will increase BLE gross strength to 4+/5 as to improve functional strength for independent gait, increased standing tolerance and increased ADL ability.  Baseline  11/24: R hip grossly 3-/5 knee unable to be assessed 1/5: R adduction and hip flexion 3+/5, rest of LE 4/5    Time  8    Period  Weeks    Status  Partially Met    Target Date  01/22/20      PT LONG TERM GOAL #3   Title  Patient will increase lower extremity functional scale to >60/80 to demonstrate improved functional mobility and increased tolerance with ADLs.    Baseline  11/24: 36/80 1/5: 58/80    Time  8    Period  Weeks    Status  Partially Met    Target Date  01/22/20      PT LONG TERM GOAL #4   Title  Patient will perform the 6 minute walk test for >109f with no episodes of hip hiking, vaulting, or antalgia for return to PLOF.    Baseline  11/24: unable to perform 1/5: 1250 ft    Time  8    Period  Weeks    Status  Achieved      PT LONG TERM GOAL #5   Title  Patient will perform a squat with equal weight shift and no LOB or increase of pain >1 degree on the VAS scale for functional activty performance and return to PLOF.    Baseline  11/24: unable to perform 1/5: modified squat with slight weight shift onto LLE    Time  8    Period  Weeks    Status  Partially Met    Target Date  01/22/20      Additional Long Term Goals   Additional Long Term Goals  Yes      PT LONG TERM GOAL #6   Title  Patient will perform the 6 minute walk test for >1809 ft with no episodes of hip hiking, vaulting, or antalgia for return to PLOF to be within functional norms for age.    Baseline  1/5:  1250 ft, antalgic gait pattern    Time  8    Period  Weeks    Status  New    Target Date  01/22/20      PT LONG TERM GOAL #7   Title  Patient will tolerate 20 seconds of single leg stance without loss of balance to improve ability to get in and out of shower safely, return to gym activities, and return to PLOF.    Baseline  limited tolerance of 10 seconds    Time  8    Period  Weeks    Status  New    Target Date  01/22/20            Plan - 12/09/19 1338    Clinical Impression Statement  Pt presents with excellent motivation and is progressing well with LE strength. She continues to be challenged by single leg stability, but it has improved significantly since beginning PT. Pt tolerated exercises on unstable surface well today, with minimal need for UE to stabilize. Pt will continue to benefit from skilled physical therapy to improve LE strength and stability for return to PLOF while following protocol provided by surgeon.    Personal Factors and Comorbidities  Comorbidity 1;Education;Past/Current Experience    Comorbidities  two previous MPFL surgeries, anemia    Examination-Activity Limitations  Bathing;Bed Mobility;Caring for Others;Carry;Stairs;Squat;Sleep;Sit;Locomotion Level;Lift;Stand;Transfers    Examination-Participation Restrictions  Cleaning;Community Activity;Meal Prep;Laundry;Shop;Yard Work    Stability/Clinical Decision Making  Stable/Uncomplicated    Rehab Potential  Excellent  PT Frequency  2x / week    PT Duration  8 weeks    PT Treatment/Interventions  ADLs/Self Care Home Management;Aquatic Therapy;Cryotherapy;Electrical Stimulation;Biofeedback;Iontophoresis '4mg'$ /ml Dexamethasone;Moist Heat;Ultrasound;Functional mobility training;Stair training;Gait training;DME Instruction;Therapeutic activities;Therapeutic exercise;Balance training;Neuromuscular re-education;Manual techniques;Patient/family education;Compression bandaging;Scar mobilization;Passive range of  motion;Dry needling;Taping;Splinting;Energy conservation    PT Next Visit Plan  sls , strengthening    PT Home Exercise Plan  see above    Consulted and Agree with Plan of Care  Patient       Patient will benefit from skilled therapeutic intervention in order to improve the following deficits and impairments:  Abnormal gait, Decreased activity tolerance, Decreased endurance, Decreased mobility, Decreased range of motion, Difficulty walking, Decreased strength, Decreased scar mobility, Increased edema, Impaired flexibility, Impaired perceived functional ability, Increased fascial restricitons, Improper body mechanics, Pain  Visit Diagnosis: Stiffness of right knee, not elsewhere classified  Other abnormalities of gait and mobility  Muscle weakness (generalized)     Problem List Patient Active Problem List   Diagnosis Date Noted  . Plant irritant contact dermatitis 06/17/2019  . Iron deficiency anemia 12/09/2018  . Preventative health care 12/04/2016  . Seasonal allergic rhinitis 11/27/2014    Florinda Marker, SPT This entire session was performed under direct supervision and direction of a licensed therapist/therapist assistant . I have personally read, edited and approve of the note as written.  Janna Arch, PT, DPT   12/09/2019, 5:32 PM  [place] MAIN [facility] SERVICES [address] [place], [place], [address] Phone: [phone]   Fax:  [phone]  Name: Elanah Osmanovic MRN: [id number] Date of Birth: 1976/02/15

## 2019-12-11 ENCOUNTER — Ambulatory Visit: Payer: 59

## 2019-12-11 ENCOUNTER — Other Ambulatory Visit: Payer: Self-pay

## 2019-12-11 ENCOUNTER — Telehealth: Payer: Self-pay

## 2019-12-11 DIAGNOSIS — M25661 Stiffness of right knee, not elsewhere classified: Secondary | ICD-10-CM | POA: Diagnosis not present

## 2019-12-11 DIAGNOSIS — R2689 Other abnormalities of gait and mobility: Secondary | ICD-10-CM

## 2019-12-11 DIAGNOSIS — M6281 Muscle weakness (generalized): Secondary | ICD-10-CM

## 2019-12-11 NOTE — Therapy (Signed)
[place] MAIN [facility] SERVICES [address] [place], [place], [address] Phone: [phone]   Fax:  [phone]  Physical Therapy Treatment  Patient Details  Name: Dawn Hamilton MRN: [id number] Date of Birth: 03-12-1976 Referring Provider (PT): Griffin Basil, dax T   Encounter Date: 12/11/2019  PT End of Session - 12/11/19 1102    Visit Number  16    Number of Visits  29    Date for PT Re-Evaluation  01/22/20    Authorization Type  6/10 PN 11/18/19    PT Start Time  1018    PT Stop Time  1058    PT Time Calculation (min)  40 min    Activity Tolerance  Patient tolerated treatment well    Behavior During Therapy  [facility] for tasks assessed/performed       Past Medical History:  Diagnosis Date  . Allergy   . Anxiety   . Depression   . Precancerous skin lesion     Past Surgical History:  Procedure Laterality Date  . KNEE ARTHROSCOPY WITH MEDIAL PATELLAR FEMORAL LIGAMENT RECONSTRUCTION Right 10/02/2019   Procedure: KNEE ARTHROSCOPY WITH MEDIAL PATELLAR FEMORAL LIGAMENT RECONSTRUCTION WITH ALLOGRAPH;  Surgeon: Hiram Gash, MD;  Location: [place];  Service: Orthopedics;  Laterality: Right;  . KNEE SURGERY     x3    There were no vitals filed for this visit.     Therapeutic Exercise:  -Treadmill 1 min walk, 4 min jog, attention needed for increased R hamstring control during terminal swing phase that corrected with increased treadmill speed   Superset, 3x each set  -R Single leg balance holding BTB for PT provided perturbations to challenge stability, 20 sec -4 way shuffles in squat stance with BTB around waist for increased resistance, 20 sec  -Single leg explosive marches going into PF on standing RLE, use of contralateral arm swing, 10x, progressed to 10x plyometric version adding push off from standing LE, for glute/gastroc/quad strength of standing LE and proper landing technique  -Cable glute kick back/donkey kick  with RLE, 7.5lb, 2x10  -Double LE leg press 70# 10x, 85# 10x -Single RLE leg press 25# 10x  -Split squats with RLE, lowering to 45 degrees, 2x10, Min A tactile cueing for correction of R knee valgus, RUE support required to assume initial position  -Figure 4 hip thrust from plinth (LLE crossed over RLE), 2x10  -Leg extension machine, ~35# 10x neutral, 10x IR, 10x ER  -RDL with BTB loop, 15x, Min A cueing for reduced knee flexion and increased hip hinge   Pt is demonstrating excellent strength progression and increased stability of RLE. Pt had reduced knee valgus during exercises, even with fatigue, but could benefit from further SLE balance endurance activity for increased functionality in ADLs. Pt can benefit from increased glute activation during gait in order to reduce pressure on R knee. Pt will continue to benefit from skilled PT intervention to improve RLE strength and stability for return to PLOF while following protocol provided by surgeon.      PT Education - 12/11/19 1000    Education Details  exercise technique, body mechanics    Person(s) Educated  Patient    Methods  Explanation;Demonstration;Tactile cues;Verbal cues    Comprehension  Verbalized understanding;Returned demonstration;Verbal cues required;Tactile cues required       PT Short Term Goals - 11/28/19 4920      PT SHORT TERM GOAL #1   Title  Patient will be independent in  home exercise program to improve strength/mobility for better functional independence with ADLs.    Baseline  11/24: HEP given 1/5: HEP compliant 1/15: HEP compliant    Time  4    Period  Weeks    Status  Achieved    Target Date  11/04/19      PT SHORT TERM GOAL #2   Title  Patient will demonstrate 100 degrees of flexion with active range of motion for improved gait mechanics and muscular activation/alignment    Baseline  11/24: 61 1/5: supine 129 seated >120    Time  4    Period  Weeks    Status  Achieved    Target Date  11/04/19         PT Long Term Goals - 11/28/19 0821      PT LONG TERM GOAL #1   Title  Patient will demonstrate >/=115 degrees of flexion with active range of motion for improved gait mechanics and muscular activation/alignment and return to functional ROM    Baseline  11/24: 61 degrees 1/5: supine 129 degrees flexion, >120 degrees flexion seated    Time  8    Period  Weeks    Status  Achieved      PT LONG TERM GOAL #2   Title  Patient will increase BLE gross strength to 4+/5 as to improve functional strength for independent gait, increased standing tolerance and increased ADL ability.    Baseline  11/24: R hip grossly 3-/5 knee unable to be assessed 1/5: R adduction and hip flexion 3+/5, rest of LE 4/5    Time  8    Period  Weeks    Status  Partially Met    Target Date  01/22/20      PT LONG TERM GOAL #3   Title  Patient will increase lower extremity functional scale to >60/80 to demonstrate improved functional mobility and increased tolerance with ADLs.    Baseline  11/24: 36/80 1/5: 58/80    Time  8    Period  Weeks    Status  Partially Met    Target Date  01/22/20      PT LONG TERM GOAL #4   Title  Patient will perform the 6 minute walk test for >1010f with no episodes of hip hiking, vaulting, or antalgia for return to PLOF.    Baseline  11/24: unable to perform 1/5: 1250 ft    Time  8    Period  Weeks    Status  Achieved      PT LONG TERM GOAL #5   Title  Patient will perform a squat with equal weight shift and no LOB or increase of pain >1 degree on the VAS scale for functional activty performance and return to PLOF.    Baseline  11/24: unable to perform 1/5: modified squat with slight weight shift onto LLE    Time  8    Period  Weeks    Status  Partially Met    Target Date  01/22/20      Additional Long Term Goals   Additional Long Term Goals  Yes      PT LONG TERM GOAL #6   Title  Patient will perform the 6 minute walk test for >1809 ft with no episodes of hip hiking,  vaulting, or antalgia for return to PLOF to be within functional norms for age.    Baseline  1/5: 1250 ft, antalgic gait pattern    Time  8    Period  Weeks    Status  New    Target Date  01/22/20      PT LONG TERM GOAL #7   Title  Patient will tolerate 20 seconds of single leg stance without loss of balance to improve ability to get in and out of shower safely, return to gym activities, and return to PLOF.    Baseline  limited tolerance of 10 seconds    Time  8    Period  Weeks    Status  New    Target Date  01/22/20            Plan - 12/11/19 1140    Clinical Impression Statement  Pt is demonstrating excellent strength progression and increased stability of RLE. Pt had reduced knee valgus during exercises, even with fatigue, but could benefit from further SLE balance endurance activity for increased functionality in ADLs. Pt can benefit from increased glute activation during gait in order to reduce pressure on R knee. Pt will continue to benefit from skilled PT intervention to improve RLE strength and stability for return to PLOF while following protocol provided by surgeon.    Personal Factors and Comorbidities  Comorbidity 1;Education;Past/Current Experience    Comorbidities  two previous MPFL surgeries, anemia    Examination-Activity Limitations  Bathing;Bed Mobility;Caring for Others;Carry;Stairs;Squat;Sleep;Sit;Locomotion Level;Lift;Stand;Transfers    Examination-Participation Restrictions  Cleaning;Community Activity;Meal Prep;Laundry;Shop;Yard Work    Stability/Clinical Decision Making  Stable/Uncomplicated    Rehab Potential  Excellent    PT Frequency  2x / week    PT Duration  8 weeks    PT Treatment/Interventions  ADLs/Self Care Home Management;Aquatic Therapy;Cryotherapy;Electrical Stimulation;Biofeedback;Iontophoresis 40m/ml Dexamethasone;Moist Heat;Ultrasound;Functional mobility training;Stair training;Gait training;DME Instruction;Therapeutic activities;Therapeutic  exercise;Balance training;Neuromuscular re-education;Manual techniques;Patient/family education;Compression bandaging;Scar mobilization;Passive range of motion;Dry needling;Taping;Splinting;Energy conservation    PT Next Visit Plan  sls , strengthening    PT Home Exercise Plan  see above    Consulted and Agree with Plan of Care  Patient       Patient will benefit from skilled therapeutic intervention in order to improve the following deficits and impairments:  Abnormal gait, Decreased activity tolerance, Decreased endurance, Decreased mobility, Decreased range of motion, Difficulty walking, Decreased strength, Decreased scar mobility, Increased edema, Impaired flexibility, Impaired perceived functional ability, Increased fascial restricitons, Improper body mechanics, Pain  Visit Diagnosis: Stiffness of right knee, not elsewhere classified  Other abnormalities of gait and mobility  Muscle weakness (generalized)     Problem List Patient Active Problem List   Diagnosis Date Noted  . Plant irritant contact dermatitis 06/17/2019  . Iron deficiency anemia 12/09/2018  . Preventative health care 12/04/2016  . Seasonal allergic rhinitis 11/27/2014   JFlorinda Marker SPT  This entire session was performed under direct supervision and direction of a licensed therapist/therapist assistant . I have personally read, edited and approve of the note as written.  MJanna Arch PT, DPT   12/11/2019, 11:41 AM  [place] [facility] [address] [address]Phone: [phone]  Fax:  [phone] Name: Dawn LabateMRN: [id number] of Birth: 206/06/1976

## 2019-12-11 NOTE — Telephone Encounter (Signed)
LVM w COVID screen, front door and back lab info 1.28.2021 TLJ

## 2019-12-15 ENCOUNTER — Other Ambulatory Visit (INDEPENDENT_AMBULATORY_CARE_PROVIDER_SITE_OTHER): Payer: 59

## 2019-12-15 ENCOUNTER — Other Ambulatory Visit: Payer: Self-pay

## 2019-12-15 DIAGNOSIS — Z0000 Encounter for general adult medical examination without abnormal findings: Secondary | ICD-10-CM

## 2019-12-15 DIAGNOSIS — D509 Iron deficiency anemia, unspecified: Secondary | ICD-10-CM | POA: Diagnosis not present

## 2019-12-15 LAB — COMPREHENSIVE METABOLIC PANEL
ALT: 12 U/L (ref 0–35)
AST: 16 U/L (ref 0–37)
Albumin: 4 g/dL (ref 3.5–5.2)
Alkaline Phosphatase: 73 U/L (ref 39–117)
BUN: 12 mg/dL (ref 6–23)
CO2: 25 mEq/L (ref 19–32)
Calcium: 9.2 mg/dL (ref 8.4–10.5)
Chloride: 105 mEq/L (ref 96–112)
Creatinine, Ser: 0.83 mg/dL (ref 0.40–1.20)
GFR: 75.4 mL/min (ref >60.00)
Glucose, Bld: 104 mg/dL — ABNORMAL HIGH (ref 70–99)
Potassium: 4.3 mEq/L (ref 3.5–5.1)
Sodium: 139 mEq/L (ref 135–145)
Total Bilirubin: 0.7 mg/dL (ref 0.2–1.2)
Total Protein: 6.3 g/dL (ref 6.0–8.3)

## 2019-12-15 LAB — LIPID PANEL
Cholesterol: 167 mg/dL (ref 0–200)
HDL: 72 mg/dL (ref >39.00)
LDL Cholesterol: 71 mg/dL (ref 0–99)
NonHDL: 94.79
Total CHOL/HDL Ratio: 2
Triglycerides: 120 mg/dL (ref 0.0–149.0)
VLDL: 24 mg/dL (ref 0.0–40.0)

## 2019-12-15 LAB — IBC + FERRITIN
Ferritin: 16 ng/mL (ref 10.0–291.0)
Iron: 241 ug/dL — ABNORMAL HIGH (ref 42–145)
Saturation Ratios: 78.6 % — ABNORMAL HIGH (ref 20.0–50.0)
Transferrin: 219 mg/dL (ref 212.0–360.0)

## 2019-12-15 LAB — CBC
HCT: 42.8 % (ref 36.0–46.0)
Hemoglobin: 14.8 g/dL (ref 12.0–15.0)
MCHC: 34.6 g/dL (ref 30.0–36.0)
MCV: 88.3 fl (ref 78.0–100.0)
Platelets: 307 10*3/uL (ref 150.0–400.0)
RBC: 4.85 Mil/uL (ref 3.87–5.11)
RDW: 13.3 % (ref 11.5–15.5)
WBC: 6 10*3/uL (ref 4.0–10.5)

## 2019-12-15 LAB — VITAMIN B12: Vitamin B-12: 163 pg/mL — ABNORMAL LOW (ref 211–911)

## 2019-12-16 ENCOUNTER — Other Ambulatory Visit: Payer: Self-pay

## 2019-12-16 ENCOUNTER — Ambulatory Visit: Payer: 59 | Attending: Orthopaedic Surgery

## 2019-12-16 DIAGNOSIS — M25661 Stiffness of right knee, not elsewhere classified: Secondary | ICD-10-CM | POA: Insufficient documentation

## 2019-12-16 DIAGNOSIS — M6281 Muscle weakness (generalized): Secondary | ICD-10-CM | POA: Insufficient documentation

## 2019-12-16 DIAGNOSIS — R2689 Other abnormalities of gait and mobility: Secondary | ICD-10-CM | POA: Diagnosis not present

## 2019-12-16 NOTE — Therapy (Signed)
[place] MAIN [facility] SERVICES [address] [place], [place], [address] Phone: [phone]   Fax:  [phone]  Physical Therapy Treatment  Patient Details  Name: Dawn Hamilton MRN: [id number] Date of Birth: February 05, 1976 Referring Provider (PT): Griffin Basil, dax T   Encounter Date: 12/16/2019  PT End of Session - 12/16/19 1527    Visit Number  17    Number of Visits  29    Date for PT Re-Evaluation  01/22/20    Authorization Type  7/10 PN 11/18/19    PT Start Time  1114    PT Stop Time  1155    PT Time Calculation (min)  41 min    Activity Tolerance  Patient tolerated treatment well    Behavior During Therapy  [facility] for tasks assessed/performed       Past Medical History:  Diagnosis Date  . Allergy   . Anxiety   . Depression   . Precancerous skin lesion     Past Surgical History:  Procedure Laterality Date  . KNEE ARTHROSCOPY WITH MEDIAL PATELLAR FEMORAL LIGAMENT RECONSTRUCTION Right 10/02/2019   Procedure: KNEE ARTHROSCOPY WITH MEDIAL PATELLAR FEMORAL LIGAMENT RECONSTRUCTION WITH ALLOGRAPH;  Surgeon: Hiram Gash, MD;  Location: [place];  Service: Orthopedics;  Laterality: Right;  . KNEE SURGERY     x3    There were no vitals filed for this visit.  Subjective Assessment - 12/16/19 1525    Subjective  Patient reports no pain after jogging on treadmill yesterday, and no falls/LOB.    Pertinent History  Patient is a pleasant 44 year old female who presents to physical therapy s/p Right revision MPFL reconstruction and patella chondroplasty  on 10/02/19 with graft chosen as a doubled over semitendinosus graft. Patient is to be WBAT in brace locked in extension. Brace is to be worn at all times except for PT and hygiene for 0-4 weeks. She saw the physician this morning. Patient has a history of MPFL reconstruction with first surgery 1996 and second surgery 2004. Other PMH includes: iron deficiency anemia and surgery PMH  includes: R roux ellsie trillant procedure 4/96, L lateral release 7/99, R medical retiniculum imbrication 2/04, c section 05/2009. 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    Limitations  Sitting;Lifting;Standing;Walking;House hold activities    How long can you sit comfortably?  requires use of immobilizer locked out at this time    How long can you stand comfortably?  WBAT with knee immobilizer locked    How long can you walk comfortably?  fatigues after 30 ft but is able to walk functional distances at this time.    Diagnostic tests  s/p surgery    Patient Stated Goals  to return to PLOF.    Currently in Pain?  No/denies    Pain Score  0-No pain    Pain Onset  More than a month ago       Therapeutic Exercise: Octane bike L8, 2.5 minutes forwards, 2.5 minutes backwards, for LE warm up prior to exercise session  Alternating kneel to squat on red mat, RUE support as needed if LOB, 10x RLE leading, 10x LLE leading, PT noted slight valgus of R knee when rising from kneel  Hip thrust from plinth with RTB hip abduction 1x at top, 2x10, cueing for chin tuck to relieve pressure from neck and cueing for glute activation  Cable RDLs with rope attachment,  22.5#, 2x, cueing for neutral spine and for foot placement at hip width  Standing cable adduction with LE attachment around ankle, 10x each LE 2.5#, 10x 7.5# with LLE, cueing for slight flexion in standing knee   Agility ladder: each exercise 2 lengths of ladder, for quick LE weight acceptance Lateral in-in-out-out, progressing to next square Forward in-in-out-out facing side of ladder, progressing to next square BLE jump two squares forward, one backwards  Jump squats, 2x10, cueing for weight acceptance during landing and push off from heels  Squat into unilateral LE abduction, alternating legs, 10x each LE  Back extension machine, 2x10, cueing for chin tuck to prevent strain  on neck, for glute/hamstring/back extensor strength  Jumping wall taps (beginning from slight knee flexion and reaching overhead to tap wall while jumping), 2x10, cueing for weight acceptance during landing  Knee extension, 43#, 10x neutral, 10x VM, 10x VL, for specific quadricep activation/strengthening  Hamstring curls, 32#, 10x  Kettle bell swings, 15#, 10x, cueing for glute activation at leg extension, for power of kettle bell swing to initiate from LEs  Lateral bounds across gym 2x, for LE weight acceptance in diagonal plane of motion and abduction strength     Patient presents with excellent motivation. She has greatly improved with weight acceptance, strength, and stabilization of RLE. She responded well to this session's plyometric/power exercises, with only slight R knee valgus beginning to appear when fatigued. Patient would benefit from further skilled PT intervention in order to improve strength and stability during endurance and plyometric tasks, which will aid in return to PLOF under surgeon's protocol.              PT Education - 12/16/19 1525    Education Details  exercise technique, body mechanics    Person(s) Educated  Patient    Methods  Explanation;Demonstration;Tactile cues;Verbal cues    Comprehension  Verbalized understanding;Returned demonstration;Verbal cues required;Tactile cues required       PT Short Term Goals - 11/28/19 1025      PT SHORT TERM GOAL #1   Title  Patient will be independent in home exercise program to improve strength/mobility for better functional independence with ADLs.    Baseline  11/24: HEP given 1/5: HEP compliant 1/15: HEP compliant    Time  4    Period  Weeks    Status  Achieved    Target Date  11/04/19      PT SHORT TERM GOAL #2   Title  Patient will demonstrate 100 degrees of flexion with active range of motion for improved gait mechanics and muscular activation/alignment    Baseline  11/24: 61 1/5: supine 129  seated >120    Time  4    Period  Weeks    Status  Achieved    Target Date  11/04/19        PT Long Term Goals - 11/28/19 0821      PT LONG TERM GOAL #1   Title  Patient will demonstrate >/=115 degrees of flexion with active range of motion for improved gait mechanics and muscular activation/alignment and return to functional ROM    Baseline  11/24: 61 degrees 1/5: supine 129 degrees flexion, >120 degrees flexion seated    Time  8    Period  Weeks    Status  Achieved      PT LONG TERM GOAL #2   Title  Patient will increase BLE gross strength to 4+/5 as to improve functional strength for independent gait,  increased standing tolerance and increased ADL ability.    Baseline  11/24: R hip grossly 3-/5 knee unable to be assessed 1/5: R adduction and hip flexion 3+/5, rest of LE 4/5    Time  8    Period  Weeks    Status  Partially Met    Target Date  01/22/20      PT LONG TERM GOAL #3   Title  Patient will increase lower extremity functional scale to >60/80 to demonstrate improved functional mobility and increased tolerance with ADLs.    Baseline  11/24: 36/80 1/5: 58/80    Time  8    Period  Weeks    Status  Partially Met    Target Date  01/22/20      PT LONG TERM GOAL #4   Title  Patient will perform the 6 minute walk test for >1021f with no episodes of hip hiking, vaulting, or antalgia for return to PLOF.    Baseline  11/24: unable to perform 1/5: 1250 ft    Time  8    Period  Weeks    Status  Achieved      PT LONG TERM GOAL #5   Title  Patient will perform a squat with equal weight shift and no LOB or increase of pain >1 degree on the VAS scale for functional activty performance and return to PLOF.    Baseline  11/24: unable to perform 1/5: modified squat with slight weight shift onto LLE    Time  8    Period  Weeks    Status  Partially Met    Target Date  01/22/20      Additional Long Term Goals   Additional Long Term Goals  Yes      PT LONG TERM GOAL #6   Title   Patient will perform the 6 minute walk test for >1809 ft with no episodes of hip hiking, vaulting, or antalgia for return to PLOF to be within functional norms for age.    Baseline  1/5: 1250 ft, antalgic gait pattern    Time  8    Period  Weeks    Status  New    Target Date  01/22/20      PT LONG TERM GOAL #7   Title  Patient will tolerate 20 seconds of single leg stance without loss of balance to improve ability to get in and out of shower safely, return to gym activities, and return to PLOF.    Baseline  limited tolerance of 10 seconds    Time  8    Period  Weeks    Status  New    Target Date  01/22/20            Plan - 12/16/19 1655    Clinical Impression Statement  Patient presents with excellent motivation. She has greatly improved with weight acceptance, strength, and stabilization of RLE. She responded well to this session's plyometric/power exercises, with only slight R knee valgus beginning to appear when fatigued. Patient would benefit from further skilled PT intervention in order to improve strength and stability during endurance and plyometric tasks, which will aid in return to PLOF under surgeon's protocol.    Personal Factors and Comorbidities  Comorbidity 1;Education;Past/Current Experience    Comorbidities  two previous MPFL surgeries, anemia    Examination-Activity Limitations  Bathing;Bed Mobility;Caring for Others;Carry;Stairs;Squat;Sleep;Sit;Locomotion Level;Lift;Stand;Transfers    Examination-Participation Restrictions  Cleaning;Community Activity;Meal Prep;Laundry;Shop;Yard Work    SMerchant navy officer Stable/Uncomplicated  Rehab Potential  Excellent    PT Frequency  2x / week    PT Duration  8 weeks    PT Treatment/Interventions  ADLs/Self Care Home Management;Aquatic Therapy;Cryotherapy;Electrical Stimulation;Biofeedback;Iontophoresis '4mg'$ /ml Dexamethasone;Moist Heat;Ultrasound;Functional mobility training;Stair training;Gait training;DME  Instruction;Therapeutic activities;Therapeutic exercise;Balance training;Neuromuscular re-education;Manual techniques;Patient/family education;Compression bandaging;Scar mobilization;Passive range of motion;Dry needling;Taping;Splinting;Energy conservation    PT Next Visit Plan  sls , strengthening    PT Home Exercise Plan  see above    Consulted and Agree with Plan of Care  Patient       Patient will benefit from skilled therapeutic intervention in order to improve the following deficits and impairments:  Abnormal gait, Decreased activity tolerance, Decreased endurance, Decreased mobility, Decreased range of motion, Difficulty walking, Decreased strength, Decreased scar mobility, Increased edema, Impaired flexibility, Impaired perceived functional ability, Increased fascial restricitons, Improper body mechanics, Pain  Visit Diagnosis: Stiffness of right knee, not elsewhere classified  Other abnormalities of gait and mobility  Muscle weakness (generalized)     Problem List Patient Active Problem List   Diagnosis Date Noted  . Plant irritant contact dermatitis 06/17/2019  . Iron deficiency anemia 12/09/2018  . Preventative health care 12/04/2016  . Seasonal allergic rhinitis 11/27/2014   Florinda Marker, SPT  This entire session was performed under direct supervision and direction of a licensed therapist/therapist assistant . I have personally read, edited and approve of the note as written.  Janna Arch, PT, DPT     12/17/2019, 9:12 AM  [place] MAIN [facility] SERVICES [address] [place], [place], [address] Phone: [phone]   Fax:  [phone]  Name: Dawn Hamilton MRN: [id number] Date of Birth: 03-Nov-1976

## 2019-12-18 ENCOUNTER — Ambulatory Visit (INDEPENDENT_AMBULATORY_CARE_PROVIDER_SITE_OTHER): Payer: 59 | Admitting: Primary Care

## 2019-12-18 ENCOUNTER — Encounter: Payer: Self-pay | Admitting: Oncology

## 2019-12-18 ENCOUNTER — Encounter: Payer: Self-pay | Admitting: Primary Care

## 2019-12-18 ENCOUNTER — Other Ambulatory Visit: Payer: Self-pay

## 2019-12-18 ENCOUNTER — Ambulatory Visit: Payer: 59

## 2019-12-18 VITALS — BP 108/72 | HR 72 | Temp 96.3°F | Ht 68.75 in | Wt 177.8 lb

## 2019-12-18 DIAGNOSIS — Z0000 Encounter for general adult medical examination without abnormal findings: Secondary | ICD-10-CM | POA: Diagnosis not present

## 2019-12-18 DIAGNOSIS — E538 Deficiency of other specified B group vitamins: Secondary | ICD-10-CM | POA: Diagnosis not present

## 2019-12-18 DIAGNOSIS — D509 Iron deficiency anemia, unspecified: Secondary | ICD-10-CM

## 2019-12-18 DIAGNOSIS — J302 Other seasonal allergic rhinitis: Secondary | ICD-10-CM

## 2019-12-18 NOTE — Assessment & Plan Note (Signed)
No longer on iron infusions, recent iron levels above goal. Continue to monitor.

## 2019-12-18 NOTE — Progress Notes (Signed)
Subjective:    Patient ID: Dawn Hamilton, female    DOB: 1976-10-09, 44 y.o.   MRN: [id number]  HPI  This visit occurred during the SARS-CoV-2 public health emergency.  Safety protocols were in place, including screening questions prior to the visit, additional usage of staff PPE, and extensive cleaning of exam room while observing appropriate contact time as indicated for disinfecting solutions.   Dawn Hamilton is a 44 year old female who presents today for complete physical.  Immunizations: -Tetanus: Completed in 2012 -Influenza: Completed this season   Diet: She endorses a healthy diet. Exercise: She just started exercising   Eye exam: Completed in 2020 Dental exam: Completed in 2020  Pap Smear: Completed in 2019 Mammogram: Completed in September 2020  BP Readings from Last 3 Encounters:  12/18/19 108/72  10/02/19 125/90  09/19/19 (!) 128/92     Review of Systems  Constitutional: Negative for unexpected weight change.  HENT: Negative for rhinorrhea.   Respiratory: Negative for cough and shortness of breath.   Cardiovascular: Negative for chest pain.  Gastrointestinal: Negative for constipation and diarrhea.  Genitourinary: Negative for difficulty urinating and menstrual problem.  Musculoskeletal: Negative for arthralgias and myalgias.  Skin: Negative for rash.  Allergic/Immunologic: Negative for environmental allergies.  Neurological: Negative for dizziness, numbness and headaches.  Psychiatric/Behavioral: The patient is not nervous/anxious.        Past Medical History:  Diagnosis Date  . Allergy   . Anxiety   . Depression   . Precancerous skin lesion      Social History   Socioeconomic History  . Marital status: Married    Spouse name: Not on file  . Number of children: Not on file  . Years of education: Not on file  . Highest education level: Not on file  Occupational History  . Not on file  Tobacco Use  . Smoking status: Never Smoker  .  Smokeless tobacco: Never Used  Substance and Sexual Activity  . Alcohol use: Yes  . Drug use: Never  . Sexual activity: Not on file  Other Topics Concern  . Not on file  Social History Narrative   Married.   2 children.   Works as a Psychologist, forensic.   Enjoys spending time outdoors, going to the lake.   Social Determinants of Health   Financial Resource Strain:   . Difficulty of Paying Living Expenses: Not on file  Food Insecurity:   . Worried About Charity fundraiser in the Last Year: Not on file  . Ran Out of Food in the Last Year: Not on file  Transportation Needs:   . Lack of Transportation (Medical): Not on file  . Lack of Transportation (Non-Medical): Not on file  Physical Activity:   . Days of Exercise per Week: Not on file  . Minutes of Exercise per Session: Not on file  Stress:   . Feeling of Stress : Not on file  Social Connections:   . Frequency of Communication with Friends and Family: Not on file  . Frequency of Social Gatherings with Friends and Family: Not on file  . Attends Religious Services: Not on file  . Active Member of Clubs or Organizations: Not on file  . Attends Archivist Meetings: Not on file  . Marital Status: Not on file  Intimate Partner Violence:   . Fear of Current or Ex-Partner: Not on file  . Emotionally Abused: Not on file  . Physically Abused: Not on file  .  Sexually Abused: Not on file    Past Surgical History:  Procedure Laterality Date  . KNEE ARTHROSCOPY WITH MEDIAL PATELLAR FEMORAL LIGAMENT RECONSTRUCTION Right 10/02/2019   Procedure: KNEE ARTHROSCOPY WITH MEDIAL PATELLAR FEMORAL LIGAMENT RECONSTRUCTION WITH ALLOGRAPH;  Surgeon: Hiram Gash, MD;  Location: [facility];  Service: Orthopedics;  Laterality: Right;  . KNEE SURGERY     x3    Family History  Problem Relation Age of Onset  . Colon cancer Maternal Grandfather   . Diabetes Maternal Grandfather   . Bladder Cancer Paternal Grandmother   .  Healthy Mother   . Healthy Father     Allergies  Allergen Reactions  . Codeine Nausea Only  . Wound Dressing Adhesive   . Benzoin Rash  . Iodine Rash    Current Outpatient Medications on File Prior to Visit  Medication Sig Dispense Refill  . loratadine (CLARITIN) 10 MG tablet Take 10 mg by mouth daily as needed for allergies.    . Multiple Vitamins-Minerals (MULTIVITAMIN ADULT) TABS      No current facility-administered medications on file prior to visit.    BP 108/72   Pulse 72   Temp (!) 96.3 F (35.7 C) (Temporal)   Ht 5' 8.75" (1.746 m)   Wt 177 lb 12 oz (80.6 kg)   LMP 11/25/2019   SpO2 99%   BMI 26.44 kg/m    Objective:   Physical Exam  Constitutional: She is oriented to person, place, and time. She appears well-nourished.  HENT:  Right Ear: Tympanic membrane and ear canal normal.  Left Ear: Tympanic membrane and ear canal normal.  Mouth/Throat: Oropharynx is clear and moist.  Eyes: Pupils are equal, round, and reactive to light. EOM are normal.  Cardiovascular: Normal rate and regular rhythm.  Respiratory: Effort normal and breath sounds normal.  GI: Soft. Bowel sounds are normal. There is no abdominal tenderness.  Musculoskeletal:        General: Normal range of motion.     Cervical back: Neck supple.  Neurological: She is alert and oriented to person, place, and time. No cranial nerve deficit.  Reflex Scores:      Patellar reflexes are 2+ on the right side and 2+ on the left side. Skin: Skin is warm and dry.  Psychiatric: She has a normal mood and affect.           Assessment & Plan:

## 2019-12-18 NOTE — Assessment & Plan Note (Signed)
Immunizations UTD. Pap smear UTD. Mammogram UTD. Encouraged a healthy diet, regular exercise. Exam today unremarkable. Labs reviewed.

## 2019-12-18 NOTE — Assessment & Plan Note (Signed)
Recent level of 167 which is too low. Discussed oral supplementation of 1000 mcg daily. Repeat levels in 4 months.

## 2019-12-18 NOTE — Assessment & Plan Note (Signed)
Doing well on Claritin, continue same.

## 2019-12-18 NOTE — Patient Instructions (Signed)
Continue exercising. You should be getting 150 minutes of moderate intensity exercise weekly.  Continue to work on a healthy diet. Ensure you are consuming 64 ounces of water daily.  Start oral B12 1000 mcg once daily.  Schedule a lab only appointment for 4 months for B12 check.  It was a pleasure to see you today!   Preventive Care 30-42 Years Old, Female Preventive care refers to visits with your health care provider and lifestyle choices that can promote health and wellness. This includes:  A yearly physical exam. This may also be called an annual well check.  Regular dental visits and eye exams.  Immunizations.  Screening for certain conditions.  Healthy lifestyle choices, such as eating a healthy diet, getting regular exercise, not using drugs or products that contain nicotine and tobacco, and limiting alcohol use. What can I expect for my preventive care visit? Physical exam Your health care provider will check your:  Height and weight. This may be used to calculate body mass index (BMI), which tells if you are at a healthy weight.  Heart rate and blood pressure.  Skin for abnormal spots. Counseling Your health care provider may ask you questions about your:  Alcohol, tobacco, and drug use.  Emotional well-being.  Home and relationship well-being.  Sexual activity.  Eating habits.  Work and work Statistician.  Method of birth control.  Menstrual cycle.  Pregnancy history. What immunizations do I need?  Influenza (flu) vaccine  This is recommended every year. Tetanus, diphtheria, and pertussis (Tdap) vaccine  You may need a Td booster every 10 years. Varicella (chickenpox) vaccine  You may need this if you have not been vaccinated. Zoster (shingles) vaccine  You may need this after age 49. Measles, mumps, and rubella (MMR) vaccine  You may need at least one dose of MMR if you were born in 1957 or later. You may also need a second  dose. Pneumococcal conjugate (PCV13) vaccine  You may need this if you have certain conditions and were not previously vaccinated. Pneumococcal polysaccharide (PPSV23) vaccine  You may need one or two doses if you smoke cigarettes or if you have certain conditions. Meningococcal conjugate (MenACWY) vaccine  You may need this if you have certain conditions. Hepatitis A vaccine  You may need this if you have certain conditions or if you travel or work in places where you may be exposed to hepatitis A. Hepatitis B vaccine  You may need this if you have certain conditions or if you travel or work in places where you may be exposed to hepatitis B. Haemophilus influenzae type b (Hib) vaccine  You may need this if you have certain conditions. Human papillomavirus (HPV) vaccine  If recommended by your health care provider, you may need three doses over 6 months. You may receive vaccines as individual doses or as more than one vaccine together in one shot (combination vaccines). Talk with your health care provider about the risks and benefits of combination vaccines. What tests do I need? Blood tests  Lipid and cholesterol levels. These may be checked every 5 years, or more frequently if you are over 60 years old.  Hepatitis C test.  Hepatitis B test. Screening  Lung cancer screening. You may have this screening every year starting at age 39 if you have a 30-pack-year history of smoking and currently smoke or have quit within the past 15 years.  Colorectal cancer screening. All adults should have this screening starting at age 72 and continuing  until age 94. Your health care provider may recommend screening at age 60 if you are at increased risk. You will have tests every 1-10 years, depending on your results and the type of screening test.  Diabetes screening. This is done by checking your blood sugar (glucose) after you have not eaten for a while (fasting). You may have this done every  1-3 years.  Mammogram. This may be done every 1-2 years. Talk with your health care provider about when you should start having regular mammograms. This may depend on whether you have a family history of breast cancer.  BRCA-related cancer screening. This may be done if you have a family history of breast, ovarian, tubal, or peritoneal cancers.  Pelvic exam and Pap test. This may be done every 3 years starting at age 61. Starting at age 66, this may be done every 5 years if you have a Pap test in combination with an HPV test. Other tests  Sexually transmitted disease (STD) testing.  Bone density scan. This is done to screen for osteoporosis. You may have this scan if you are at high risk for osteoporosis. Follow these instructions at home: Eating and drinking  Eat a diet that includes fresh fruits and vegetables, whole grains, lean protein, and low-fat dairy.  Take vitamin and mineral supplements as recommended by your health care provider.  Do not drink alcohol if: ? Your health care provider tells you not to drink. ? You are pregnant, may be pregnant, or are planning to become pregnant.  If you drink alcohol: ? Limit how much you have to 0-1 drink a day. ? Be aware of how much alcohol is in your drink. In the [place] one drink equals one 12 oz bottle of beer (355 mL), one 5 oz glass of wine (148 mL), or one 1 oz glass of hard liquor (44 mL). Lifestyle  Take daily care of your teeth and gums.  Stay active. Exercise for at least 30 minutes on 5 or more days each week.  Do not use any products that contain nicotine or tobacco, such as cigarettes, e-cigarettes, and chewing tobacco. If you need help quitting, ask your health care provider.  If you are sexually active, practice safe sex. Use a condom or other form of birth control (contraception) in order to prevent pregnancy and STIs (sexually transmitted infections).  If told by your health care provider, take low-dose aspirin daily  starting at age 36. What's next?  Visit your health care provider once a year for a well check visit.  Ask your health care provider how often you should have your eyes and teeth checked.  Stay up to date on all vaccines. This information is not intended to replace advice given to you by your health care provider. Make sure you discuss any questions you have with your health care provider. Document Revised: 07/11/2018 Document Reviewed: 07/11/2018 Elsevier Patient Education  2020 [employer].

## 2019-12-22 ENCOUNTER — Ambulatory Visit: Payer: 59

## 2019-12-22 ENCOUNTER — Other Ambulatory Visit: Payer: Self-pay

## 2019-12-22 DIAGNOSIS — M6281 Muscle weakness (generalized): Secondary | ICD-10-CM

## 2019-12-22 DIAGNOSIS — M25661 Stiffness of right knee, not elsewhere classified: Secondary | ICD-10-CM

## 2019-12-22 DIAGNOSIS — R2689 Other abnormalities of gait and mobility: Secondary | ICD-10-CM | POA: Diagnosis not present

## 2019-12-22 NOTE — Therapy (Signed)
[place] MAIN [facility] SERVICES [address] [place], [place], [address] Phone: [phone]   Fax:  [phone]  Physical Therapy Treatment/Discharge   Patient Details  Name: Dawn Hamilton MRN: [id number] Date of Birth: [birth year] Referring Provider (PT): Griffin Basil, dax T   Encounter Date: 12/22/2019  PT End of Session - 12/22/19 1108    Visit Number  18    Number of Visits  29    Date for PT Re-Evaluation  01/22/20    Authorization Type  8/10 PN 11/18/19    PT Start Time  1100    PT Stop Time  1144    PT Time Calculation (min)  44 min    Activity Tolerance  Patient tolerated treatment well    Behavior During Therapy  [facility] for tasks assessed/performed       Past Medical History:  Diagnosis Date  . Allergy   . Anxiety   . Depression   . Precancerous skin lesion     Past Surgical History:  Procedure Laterality Date  . KNEE ARTHROSCOPY WITH MEDIAL PATELLAR FEMORAL LIGAMENT RECONSTRUCTION Right 10/02/2019   Procedure: KNEE ARTHROSCOPY WITH MEDIAL PATELLAR FEMORAL LIGAMENT RECONSTRUCTION WITH ALLOGRAPH;  Surgeon: Hiram Gash, MD;  Location: [facility];  Service: Orthopedics;  Laterality: Right;  . KNEE SURGERY     x3    There were no vitals filed for this visit.  Subjective Assessment - 12/22/19 1106    Subjective  Patient reports compliance with HEP, has returned to slow jogging, is seeing physician tomorrow. Is aware that today is last session.    Pertinent History  Patient is a pleasant 44 year old female who presents to physical therapy s/p Right revision MPFL reconstruction and patella chondroplasty  on 10/02/19 with graft chosen as a doubled over semitendinosus graft. Patient is to be WBAT in brace locked in extension. Brace is to be worn at all times except for PT and hygiene for 0-4 weeks. She saw the physician this morning. Patient has a history of MPFL reconstruction with first surgery 1996 and second surgery  2004. 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    Limitations  Sitting;Lifting;Standing;Walking;House hold activities    How long can you sit comfortably?  requires use of immobilizer locked out at this time    How long can you stand comfortably?  WBAT with knee immobilizer locked    How long can you walk comfortably?  fatigues after 30 ft but is able to walk functional distances at this time.    Diagnostic tests  s/p surgery    Patient Stated Goals  to return to PLOF.    Currently in Pain?  No/denies          Goal performance  6 MWT: 1463 ft LEFS: 80/80       Right Left  Hip flexion 4+/5 4/5  Hip Abduction 5/5 5/5  Hip Adduction 4+/5 4/5  Knee Extension  4+/5 4/5  Knee Flexion 5/5 5/5  DF 5/5 4+/5  PF 5/5 5/5      Treat: Single leg RDL resisting TB pulling into adduction: RTB, 12x challenging for stability.   Flamingos (single leg press rainbow ball to wall) in mini squat   Single leg slider: Posterior  lunge 10x each LE, lateral 10x each LE, cueing for body mechanics and sequencing.    Plank straight leg extension 10x each LE, on elbows and feet  Side plank position; keep bottom shin on table, open up into clam, then back down 10x each side; very challenging with progression   TRX:  single leg squats ; cueing for knee alignment and placement of feet and UE's for optimal muscle recruitment.    In gym:  3 level box: eccentric jump down small hop at bottom of landing, improved stability with repetition, cueing for knee flexion for "light on feet" 10x   Cable machine:  Rope RDL at lowest level 23 lb, focus on hip hinge with eccentric control and limited knee movement for optimal recruitment. 10x ; 2 sets   Straight  bar, arms crossed holding bar, wide stance squat with 20lb weight with cable at lowest position; 10x 2 sets   pullup assist machine: R hip/knee press downs , single limb, cueing for muscle activation, position of self for optimal recruitment. #34 lb first set of 12x, #45lb second set 10x.      PT Education - 12/22/19 1107    Education Details  goals, discharge    Person(s) Educated  Patient    Methods  Explanation;Demonstration;Tactile cues;Verbal cues    Comprehension  Verbalized understanding;Returned demonstration;Verbal cues required;Tactile cues required       PT Short Term Goals - 11/28/19 2800      PT SHORT TERM GOAL #1   Title  Patient will be independent in home exercise program to improve strength/mobility for better functional independence with ADLs.    Baseline  11/24: HEP given 1/5: HEP compliant 1/15: HEP compliant    Time  4    Period  Weeks    Status  Achieved    Target Date  11/04/19      PT SHORT TERM GOAL #2   Title  Patient will demonstrate 100 degrees of flexion with active range of motion for improved gait mechanics and muscular activation/alignment    Baseline  11/24: 61 1/5: supine 129 seated >120    Time  4    Period  Weeks    Status  Achieved    Target Date  11/04/19        PT Long Term Goals - 12/22/19 1117      PT LONG TERM GOAL #1   Title  Patient will demonstrate >/=115 degrees of flexion with active range of motion for improved gait mechanics and muscular activation/alignment and return to functional ROM    Baseline  11/24: 61 degrees 1/5: supine 129 degrees flexion, >120 degrees flexion seated    Time  8    Period  Weeks    Status  Achieved      PT LONG TERM GOAL #2   Title  Patient will increase BLE gross strength to 4+/5 as to improve functional strength for independent gait, increased standing tolerance and increased ADL ability.    Baseline  11/24: R hip grossly 3-/5 knee unable to be assessed 1/5: R adduction and hip flexion 3+/5,  rest of LE 4/5 2/8: grossly 4+/5    Time  8    Period  Weeks    Status  Achieved      PT LONG TERM GOAL #3   Title  Patient will increase lower extremity functional scale to >60/80 to demonstrate improved functional mobility and increased tolerance with ADLs.    Baseline  11/24: 36/80 1/5:  58/80 2/8: 80/80    Time  8    Period  Weeks    Status  Achieved      PT LONG TERM GOAL #4   Title  Patient will perform the 6 minute walk test for >1081f with no episodes of hip hiking, vaulting, or antalgia for return to PLOF.    Baseline  11/24: unable to perform 1/5: 1250 ft    Time  8    Period  Weeks    Status  Achieved      PT LONG TERM GOAL #5   Title  Patient will perform a squat with equal weight shift and no LOB or increase of pain >1 degree on the VAS scale for functional activty performance and return to PLOF.    Baseline  11/24: unable to perform 1/5: modified squat with slight weight shift onto LLE 2/8: equal weight shift    Time  8    Period  Weeks    Status  Achieved      PT LONG TERM GOAL #6   Title  Patient will perform the 6 minute walk test for >1809 ft with no episodes of hip hiking, vaulting, or antalgia for return to PLOF to be within functional norms for age.    Baseline  1/5: 1250 ft, antalgic gait pattern 2/8: 1463 ft equal weight shift    Time  8    Period  Weeks    Status  Partially Met      PT LONG TERM GOAL #7   Title  Patient will tolerate 20 seconds of single leg stance without loss of balance to improve ability to get in and out of shower safely, return to gym activities, and return to PLOF.    Baseline  limited tolerance of 10 seconds 2/8: >25 seconds    Time  8    Period  Weeks    Status  Achieved            Plan - 12/22/19 1129    Clinical Impression Statement  Patient demonstrates excellent progress of functional goals meeting and/or making progress towards all goals. She is now independent with her post op recovery and is pain free. Due to her  compliance and demonstration of understanding of future POC she is safe for discharge. I will be happy to see this patient again in the future as needed.    Personal Factors and Comorbidities  Comorbidity 1;Education;Past/Current Experience    Comorbidities  two previous MPFL surgeries, anemia    Examination-Activity Limitations  Bathing;Bed Mobility;Caring for Others;Carry;Stairs;Squat;Sleep;Sit;Locomotion Level;Lift;Stand;Transfers    Examination-Participation Restrictions  Cleaning;Community Activity;Meal Prep;Laundry;Shop;Yard Work    Stability/Clinical Decision Making  Stable/Uncomplicated    Rehab Potential  Excellent    PT Frequency  2x / week    PT Duration  8 weeks    PT Treatment/Interventions  ADLs/Self Care Home Management;Aquatic Therapy;Cryotherapy;Electrical Stimulation;Biofeedback;Iontophoresis 476mml Dexamethasone;Moist Heat;Ultrasound;Functional mobility training;Stair training;Gait training;DME Instruction;Therapeutic activities;Therapeutic exercise;Balance training;Neuromuscular re-education;Manual techniques;Patient/family education;Compression bandaging;Scar mobilization;Passive range of motion;Dry needling;Taping;Splinting;Energy conservation    PT Next Visit Plan  d/c    PT Home Exercise Plan  see above    Consulted and Agree with Plan of Care  Patient       Patient will benefit from skilled therapeutic intervention in order to improve the following deficits and impairments:  Abnormal gait, Decreased activity tolerance, Decreased endurance, Decreased mobility, Decreased range of motion, Difficulty walking, Decreased strength, Decreased scar mobility, Increased edema, Impaired flexibility, Impaired perceived functional ability, Increased  fascial restricitons, Improper body mechanics, Pain  Visit Diagnosis: Stiffness of right knee, not elsewhere classified  Other abnormalities of gait and mobility  Muscle weakness (generalized)     Problem List Patient Active  Problem List   Diagnosis Date Noted  . Vitamin B12 deficiency 12/18/2019  . Plant irritant contact dermatitis 06/17/2019  . Iron deficiency anemia 12/09/2018  . Preventative health care 12/04/2016  . Seasonal allergic rhinitis 11/27/2014   Janna Arch, PT, DPT   12/22/2019, 11:50 AM  [place] MAIN [facility] SERVICES [address] [place], [place], [address] Phone: [phone]   Fax:  [phone]  Name: Mamta Rimmer MRN: [id number] Date of Birth: August 19, 1976

## 2019-12-23 DIAGNOSIS — M2241 Chondromalacia patellae, right knee: Secondary | ICD-10-CM | POA: Diagnosis not present

## 2020-01-12 ENCOUNTER — Other Ambulatory Visit: Payer: Self-pay

## 2020-01-12 ENCOUNTER — Ambulatory Visit: Payer: 59 | Attending: Orthopaedic Surgery

## 2020-01-12 DIAGNOSIS — M25561 Pain in right knee: Secondary | ICD-10-CM | POA: Insufficient documentation

## 2020-01-12 NOTE — Therapy (Signed)
[place] MAIN [facility] SERVICES [address] [place], [place], [address] Phone: [phone]   Fax:  [phone]  Patient Details  Name: Dawn Hamilton MRN: [id number] Date of Birth: 08-16-1976 Referring Provider:  Hiram Gash, MD  Encounter Date: 01/12/2020  PT/OT/SLP Screening Form   Time: in______     Time out_____   Complaint __R knee pain_________ Past Medical Hx:  S/p R knee surgery__ Injury Date:__2/28/21_______  Pain Scale: _only when externally rotate hip and step down stairs_ __ Patient's phone number:   Hx (this occurrence):   Patient was seen by the PT s/p surgery. Over the weekend slipped and fell, felt a pop in a R knee. Able to ambulate, pain with external rotation.    Assessment:  Lachman: slight increase in laxity of R knee Anterior Drawer: slight laxity of R knee Posterior Drawer: negative bilaterally  MCL LCL/ Valgus/Varus stress test: negative McMurry test: Positive R  Palpation: tenderness to surgical sight , jump tenderness of medial meniscus, no palpable muscle knots in adductor/quad region, no palpable tearing of surgical site  Patella mobilizations: limited by fluid in inferomedial aspect of joint.   Modified squat: good weight acceptance, functional shift, no pain  Single limb stance: RLE no pain , able to tolerate, reports feeling unsteady  Patient demonstrates increased laxity/involvement of R ACL with involvement of R medial meniscus. Surgical site appears intact with slight irritation/swelling. Primary involvement appears to be medial meniscus and ACL at this time.   Recommendations:    Comments: Wrap knee with compression wrap, Chopat with exercise. No treadmill running/walking for at least a week. If pain and instability persists past a week with active RICE (rest ice compression and elevation) patient to call surgeon for further follow up.     []  Patient would benefit from an MD referral []   Patient would benefit from a full PT/OT/ SLP evaluation and treatment. [x]  No intervention recommended at this time.If remains unstable within next week call surgeon  Janna Arch, PT, DPT   01/12/2020, 1:06 PM  [place] [address] [place], [place], [address] Phone: [phone]   Fax:  [phone]

## 2020-01-16 ENCOUNTER — Other Ambulatory Visit: Payer: 59

## 2020-01-19 ENCOUNTER — Ambulatory Visit: Payer: 59

## 2020-01-19 ENCOUNTER — Other Ambulatory Visit: Payer: 59

## 2020-01-19 ENCOUNTER — Ambulatory Visit: Payer: 59 | Admitting: Oncology

## 2020-01-23 DIAGNOSIS — M2241 Chondromalacia patellae, right knee: Secondary | ICD-10-CM | POA: Diagnosis not present

## 2020-01-23 DIAGNOSIS — M25561 Pain in right knee: Secondary | ICD-10-CM | POA: Diagnosis not present

## 2020-01-27 DIAGNOSIS — M2241 Chondromalacia patellae, right knee: Secondary | ICD-10-CM | POA: Diagnosis not present

## 2020-04-07 ENCOUNTER — Other Ambulatory Visit: Payer: Self-pay | Admitting: Primary Care

## 2020-04-07 DIAGNOSIS — E538 Deficiency of other specified B group vitamins: Secondary | ICD-10-CM

## 2020-04-07 DIAGNOSIS — R739 Hyperglycemia, unspecified: Secondary | ICD-10-CM

## 2020-04-20 ENCOUNTER — Other Ambulatory Visit (INDEPENDENT_AMBULATORY_CARE_PROVIDER_SITE_OTHER): Payer: 59

## 2020-04-20 ENCOUNTER — Other Ambulatory Visit: Payer: Self-pay

## 2020-04-20 DIAGNOSIS — R739 Hyperglycemia, unspecified: Secondary | ICD-10-CM | POA: Diagnosis not present

## 2020-04-20 DIAGNOSIS — E538 Deficiency of other specified B group vitamins: Secondary | ICD-10-CM | POA: Diagnosis not present

## 2020-04-20 LAB — HEMOGLOBIN A1C: Hgb A1c MFr Bld: 5.4 % (ref 4.6–6.5)

## 2020-04-20 LAB — VITAMIN B12: Vitamin B-12: 213 pg/mL (ref 211–911)

## 2020-04-22 NOTE — Telephone Encounter (Signed)
Dawn Hamilton

## 2020-04-22 NOTE — Telephone Encounter (Signed)
Noted. Nurse appointment on 04/28/2020

## 2020-04-28 ENCOUNTER — Ambulatory Visit (INDEPENDENT_AMBULATORY_CARE_PROVIDER_SITE_OTHER): Payer: 59 | Admitting: *Deleted

## 2020-04-28 DIAGNOSIS — E538 Deficiency of other specified B group vitamins: Secondary | ICD-10-CM | POA: Diagnosis not present

## 2020-04-28 MED ORDER — CYANOCOBALAMIN 1000 MCG/ML IJ SOLN
1000.0000 ug | Freq: Once | INTRAMUSCULAR | Status: AC
  Administered 2020-04-28: 1000 ug via INTRAMUSCULAR

## 2020-04-28 NOTE — Progress Notes (Signed)
Per orders of Kate Clark NP, injection of Vitamin B-12 given by Milani Lowenstein. Patient tolerated injection well.  

## 2020-06-03 ENCOUNTER — Other Ambulatory Visit: Payer: Self-pay | Admitting: Primary Care

## 2020-06-03 DIAGNOSIS — Z1231 Encounter for screening mammogram for malignant neoplasm of breast: Secondary | ICD-10-CM

## 2020-06-25 DIAGNOSIS — S99922A Unspecified injury of left foot, initial encounter: Secondary | ICD-10-CM | POA: Diagnosis not present

## 2020-08-01 ENCOUNTER — Ambulatory Visit
Admission: EM | Admit: 2020-08-01 | Discharge: 2020-08-01 | Disposition: A | Discharge status: Home / Self Care | Payer: 59 | Attending: Emergency Medicine | Admitting: Emergency Medicine

## 2020-08-01 ENCOUNTER — Other Ambulatory Visit: Payer: Self-pay

## 2020-08-01 ENCOUNTER — Encounter: Payer: Self-pay | Admitting: Emergency Medicine

## 2020-08-01 DIAGNOSIS — H9201 Otalgia, right ear: Secondary | ICD-10-CM | POA: Diagnosis not present

## 2020-08-01 DIAGNOSIS — T7840XS Allergy, unspecified, sequela: Secondary | ICD-10-CM

## 2020-08-01 MED ORDER — PREDNISONE 10 MG (21) PO TBPK: ORAL_TABLET | ORAL | 0 refills | Status: DC

## 2020-08-01 MED ORDER — LEVOCETIRIZINE DIHYDROCHLORIDE 5 MG PO TABS: 5.0000 mg | ORAL_TABLET | Freq: Every evening | ORAL | 2 refills | Status: DC

## 2020-08-01 NOTE — Discharge Instructions (Signed)
You were seen for right ear pain and are being treated for allergic sinusitis.   -Take your steroids as prescribed. -Aim towards your sinuses when using your Flonase.  You can use 2 sprays 2 times a day for the next 3 days, then decrease the frequency to 1 spray 2 times a day. -Follow-up if you start showing some symptoms of a bacterial sinus infection, such as low-grade fever, increased pain to one side of your face, body aches.  Take care, Dr. Marland Kitchen, NP-c

## 2020-08-01 NOTE — ED Provider Notes (Signed)
[facility] [place], [place]   Name: Dawn Hamilton DOB: [birth year] MRN: [id number] CSN: [id number] PCP: [name], NP  Arrival date and time:  08/01/20 [phone]  Chief Complaint:  Otalgia (right)   NOTE: Prior to seeing the patient today, I have reviewed the triage nursing documentation and vital signs. Clinical staff has updated patient's PMH/PSHx, current medication list, and drug allergies/intolerances to ensure comprehensive history available to assist in medical decision making.   History:   HPI: Dawn Hamilton is a 44 y.o. female who presents today with complaints of right ear pain.  Patient has a history of seasonal allergies and has been working on her yard over the past couple weekends.  She has been rotating between Claritin, Allegra, and Zyrtec to help with her allergic symptoms.  On 9/17, patient started to notice she had some increased pressure to her right facial area and ear.  She is also noticed some drainage increasing behind her ear as well.  She denies any fevers, coughing, shortness of breath, or facial pain.   Past Medical History:  Diagnosis Date   Allergy    Anxiety    Depression    Precancerous skin lesion     Past Surgical History:  Procedure Laterality Date   KNEE ARTHROSCOPY WITH MEDIAL PATELLAR FEMORAL LIGAMENT RECONSTRUCTION Right 10/02/2019   Procedure: KNEE ARTHROSCOPY WITH MEDIAL PATELLAR FEMORAL LIGAMENT RECONSTRUCTION WITH ALLOGRAPH;  Surgeon: Hiram Gash, MD;  Location: [facility];  Service: Orthopedics;  Laterality: Right;   KNEE SURGERY     x3    Family History  Problem Relation Age of Onset   Colon cancer Maternal Grandfather    Diabetes Maternal Grandfather    Bladder Cancer Paternal Grandmother    Healthy Mother    Healthy Father     Social History   Tobacco Use   Smoking status: Never Smoker   Smokeless tobacco: Never Used  Vaping Use   Vaping Use: Never used  Substance  Use Topics   Alcohol use: Yes   Drug use: Never    Patient Active Problem List   Diagnosis Date Noted   Vitamin B12 deficiency 12/18/2019   Plant irritant contact dermatitis 06/17/2019   Iron deficiency anemia 12/09/2018   Preventative health care 12/04/2016   Seasonal allergic rhinitis 11/27/2014    Home Medications:    Current Meds  Medication Sig   fluticasone (FLONASE) 50 MCG/ACT nasal spray Place 2 sprays into both nostrils daily.   loratadine (CLARITIN) 10 MG tablet Take 10 mg by mouth daily as needed for allergies.   Multiple Vitamins-Minerals (MULTIVITAMIN ADULT) TABS     Allergies:   Codeine, Wound dressing adhesive, Benzoin, and Iodine  Review of Systems (ROS): Review of Systems  Constitutional: Positive for fatigue and fever. Negative for activity change, appetite change, chills and diaphoresis.  HENT: Positive for congestion, ear pain, postnasal drip, sinus pressure, sneezing and tinnitus. Negative for ear discharge, facial swelling, hearing loss, sinus pain and sore throat.   Eyes: Negative for pain.  Respiratory: Negative for cough, shortness of breath and wheezing.   Gastrointestinal: Negative for constipation, nausea and vomiting.  Musculoskeletal: Negative for myalgias.  All other systems reviewed and are negative.    Vital Signs: Today's Vitals   08/01/20 0947 08/01/20 0950 08/01/20 1011  BP:  (!) 141/98   Pulse:  84   Resp:  14   Temp:  98.2 F (36.8 C)   TempSrc:  Oral  SpO2:  100%   Weight: 185 lb (83.9 kg)    Height: 5\' 8"  (1.727 m)    PainSc: 6   6     Physical Exam: Physical Exam Vitals and nursing note reviewed.  Constitutional:      General: She is not in acute distress.    Appearance: She is well-developed.  HENT:     Head: Normocephalic and atraumatic.     Right Ear: A middle ear effusion is present.     Left Ear: Tympanic membrane normal.  Eyes:     Conjunctiva/sclera: Conjunctivae normal.  Cardiovascular:      Rate and Rhythm: Normal rate and regular rhythm.     Heart sounds: No murmur heard.   Pulmonary:     Effort: Pulmonary effort is normal. No respiratory distress.     Breath sounds: Normal breath sounds.  Abdominal:     Palpations: Abdomen is soft.     Tenderness: There is no abdominal tenderness.  Musculoskeletal:     Cervical back: Neck supple.  Skin:    General: Skin is warm and dry.  Neurological:     Mental Status: She is alert.      Urgent Care Treatments / Results:   LABS: PLEASE NOTE: all labs that were ordered this encounter are listed, however only abnormal results are displayed. Labs Reviewed - No data to display  EKG: -None  RADIOLOGY: No results found.  PROCEDURES: Procedures  MEDICATIONS RECEIVED THIS VISIT: Medications - No data to display  PERTINENT CLINICAL COURSE NOTES/UPDATES:   Initial Impression / Assessment and Plan / Urgent Care Course:  Pertinent labs & imaging results that were available during my care of the patient were personally reviewed by me and considered in my medical decision making (see lab/imaging section of note for values and interpretations).  Dawn Hamilton is a 44 y.o. female who presents to [facility] today with complaints of right ear pain, diagnosed with allergic sinusitis, and treated as such with the medications below. NP and patient reviewed discharge instructions below during visit.   Patient is well appearing overall in clinic today. She does not appear to be in any acute distress. Presenting symptoms (see HPI) and exam as documented above.   I have reviewed the follow up and strict return precautions for any new or worsening symptoms. Patient is aware of symptoms that would be deemed urgent/emergent, and would thus require further evaluation either here or in the emergency department. At the time of discharge, she verbalized understanding and consent with the discharge plan as it was reviewed with her. All  questions were fielded by provider and/or clinic staff prior to patient discharge.    Final Clinical Impressions / Urgent Care Diagnoses:   Final diagnoses:  Otalgia of right ear  Allergy, sequela    New Prescriptions:  [place] Controlled Substance Registry consulted? Not Applicable  Meds ordered this encounter  Medications   predniSONE (STERAPRED UNI-PAK 21 TAB) 10 MG (21) TBPK tablet    Sig: Take as instructed on package (60, 50, 40, 30, 20, 10)    Dispense:  1 each    Refill:  0   levocetirizine (XYZAL) 5 MG tablet    Sig: Take 1 tablet (5 mg total) by mouth every evening.    Dispense:  30 tablet    Refill:  2      Discharge Instructions     You were seen for right ear pain and are being treated for allergic sinusitis.   -  Take your steroids as prescribed. -Aim towards your sinuses when using your Flonase.  You can use 2 sprays 2 times a day for the next 3 days, then decrease the frequency to 1 spray 2 times a day. -Follow-up if you start showing some symptoms of a bacterial sinus infection, such as low-grade fever, increased pain to one side of your face, body aches.  Take care, Dr. Marland Kitchen, NP-c    Recommended Follow up Care:  Patient encouraged to follow up with the following provider within the specified time frame, or sooner as dictated by the severity of her symptoms. As always, she was instructed that for any urgent/emergent care needs, she should seek care either here or in the emergency department for more immediate evaluation.   Gertie Baron, DNP, NP-c    Gertie Baron, NP 08/01/20 1056

## 2020-08-01 NOTE — ED Triage Notes (Signed)
Patient c/o right ear pain that started on Friday.  Patient also reports sinus congestion.  Patient denies fevers.

## 2020-08-02 ENCOUNTER — Ambulatory Visit
Admission: RE | Admit: 2020-08-02 | Discharge: 2020-08-02 | Disposition: A | Discharge status: Home / Self Care | Payer: 59 | Source: Ambulatory Visit | Attending: Primary Care | Admitting: Primary Care

## 2020-08-02 DIAGNOSIS — Z1231 Encounter for screening mammogram for malignant neoplasm of breast: Secondary | ICD-10-CM | POA: Insufficient documentation

## 2020-08-17 DIAGNOSIS — M2241 Chondromalacia patellae, right knee: Secondary | ICD-10-CM | POA: Diagnosis not present

## 2020-08-19 DIAGNOSIS — H5203 Hypermetropia, bilateral: Secondary | ICD-10-CM | POA: Diagnosis not present

## 2020-08-20 DIAGNOSIS — M2241 Chondromalacia patellae, right knee: Secondary | ICD-10-CM | POA: Diagnosis not present

## 2020-08-24 DIAGNOSIS — M2241 Chondromalacia patellae, right knee: Secondary | ICD-10-CM | POA: Diagnosis not present

## 2020-08-31 DIAGNOSIS — D2339 Other benign neoplasm of skin of other parts of face: Secondary | ICD-10-CM | POA: Diagnosis not present

## 2020-08-31 DIAGNOSIS — B078 Other viral warts: Secondary | ICD-10-CM | POA: Diagnosis not present

## 2020-09-15 ENCOUNTER — Encounter (HOSPITAL_BASED_OUTPATIENT_CLINIC_OR_DEPARTMENT_OTHER): Payer: Self-pay | Admitting: Orthopaedic Surgery

## 2020-09-15 ENCOUNTER — Other Ambulatory Visit: Payer: Self-pay

## 2020-09-20 ENCOUNTER — Other Ambulatory Visit (HOSPITAL_COMMUNITY): Payer: 59 | Attending: Orthopaedic Surgery

## 2020-09-20 ENCOUNTER — Other Ambulatory Visit: Payer: Self-pay

## 2020-09-20 ENCOUNTER — Other Ambulatory Visit
Admission: RE | Admit: 2020-09-20 | Discharge: 2020-09-20 | Disposition: A | Discharge status: Home / Self Care | Payer: 59 | Source: Ambulatory Visit | Attending: Orthopaedic Surgery | Admitting: Orthopaedic Surgery

## 2020-09-20 DIAGNOSIS — Z20822 Contact with and (suspected) exposure to covid-19: Secondary | ICD-10-CM | POA: Diagnosis not present

## 2020-09-20 DIAGNOSIS — Z01812 Encounter for preprocedural laboratory examination: Secondary | ICD-10-CM | POA: Diagnosis not present

## 2020-09-20 LAB — SARS CORONAVIRUS 2 (TAT 6-24 HRS): SARS Coronavirus 2: NEGATIVE

## 2020-09-21 NOTE — H&P (Signed)
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 female who is scheduled for, Procedure(s): RIGHT KNEE ARTHROSCOPY DEBRIDMENT  WITH MEDIAL COLLATERAL LIGAMENT RECONSTRUCTION.   The patient is a 44 year old who had a right revision MPFL on November 2020 with Dr. Griffin Basil.  Last March she was having some pain and had an MRI of her right knee which showed a positive MCL sprain but no full thickness MCL injury.  She was treated conservatively with J&J brace and was told to follow-up in a month for a recheck, however she has not followed up until now.  She is not having any improvement and has stabbing pains on the medical aspect of her right knee.  She feels she cannot get comfortable at night and her physical therapist felt she was having some adductor catching and she does feel like she has catches when she goes from flexion to full extension.    Her symptoms are rated as moderate to severe, and have been worsening.  This is significantly impairing activities of daily living.    Please see clinic note for further details on this patient's care.    She has elected for surgical management.   Past Medical History:  Diagnosis Date  . Allergy   . Anxiety   . Depression   . Precancerous skin lesion    Past Surgical History:  Procedure Laterality Date  . KNEE ARTHROSCOPY WITH MEDIAL PATELLAR FEMORAL LIGAMENT RECONSTRUCTION Right 10/02/2019   Procedure: KNEE ARTHROSCOPY WITH MEDIAL PATELLAR FEMORAL LIGAMENT RECONSTRUCTION WITH ALLOGRAPH;  Surgeon: Hiram Gash, MD;  Location: [facility];  Service: Orthopedics;  Laterality: Right;  . KNEE SURGERY     x3   Social History   Socioeconomic History  . Marital status: Married    Spouse name: Not on file  . Number of children: Not on file  . Years of education: Not on file  . Highest education level: Not on file  Occupational History  . Not  on file  Tobacco Use  . Smoking status: Never Smoker  . Smokeless tobacco: Never Used  Vaping Use  . Vaping Use: Never used  Substance and Sexual Activity  . Alcohol use: Yes  . Drug use: Never  . Sexual activity: Not on file  Other Topics Concern  . Not on file  Social History Narrative   Married.   2 children.   Works as a Psychologist, forensic.   Enjoys spending time outdoors, going to the lake.   Social Determinants of Health   Financial Resource Strain:   . Difficulty of Paying Living Expenses: Not on file  Food Insecurity:   . Worried About Charity fundraiser in the Last Year: Not on file  . Ran Out of Food in the Last Year: Not on file  Transportation Needs:   . Lack of Transportation (Medical): Not on file  . Lack of Transportation (Non-Medical): Not on file  Physical Activity:   . Days of Exercise per Week: Not on file  . Minutes of Exercise per Session: Not on file  Stress:   . Feeling of Stress : Not on file  Social Connections:   . Frequency of Communication with Friends and Family: Not on file  . Frequency of Social Gatherings with Friends and Family: Not on file  . Attends Religious Services: Not on file  . Active Member of Clubs or Organizations: Not on file  .  Attends Archivist Meetings: Not on file  . Marital Status: Not on file   Family History  Problem Relation Age of Onset  . Colon cancer Maternal Grandfather   . Diabetes Maternal Grandfather   . Bladder Cancer Paternal Grandmother   . Healthy Mother   . Healthy Father    Allergies  Allergen Reactions  . Codeine Nausea Only  . Wound Dressing Adhesive   . Benzoin Rash  . Iodine Rash   Prior to Admission medications   Medication Sig Start Date End Date Taking? Authorizing Provider  fluticasone (FLONASE) 50 MCG/ACT nasal spray Place 2 sprays into both nostrils daily.   Yes [provider]  levocetirizine (XYZAL) 5 MG tablet Take 1 tablet (5 mg total) by mouth every evening. 08/01/20   Yes Gertie Baron, NP  loratadine (CLARITIN) 10 MG tablet Take 10 mg by mouth daily as needed for allergies.   Yes [provider]  Multiple Vitamins-Minerals (MULTIVITAMIN ADULT) TABS    Yes [provider]    ROS: All other systems have been reviewed and were otherwise negative with the exception of those mentioned in the HPI and as above.  Physical Exam: General: Alert, no acute distress Cardiovascular: No pedal edema Respiratory: No cyanosis, no use of accessory musculature GI: No organomegaly, abdomen is soft and non-tender Skin: No lesions in the area of chief complaint Neurologic: Sensation intact distally Psychiatric: Patient is competent for consent with normal mood and affect Lymphatic: No axillary or cervical lymphadenopathy  MUSCULOSKELETAL:  Right knee: Pain with valgus stress test.  Tenderness about her medial incision at her MPFL reconstruction.  She has no pain with translation of the patella.    Imaging: MRI from March 2021 demonstrates a possible MCL sprain, but to my eye the full thickness MCL injury appears to be consistent with replacement of her previous MPFL screw.  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  We would likely do an on lay of her MCL doing an all suture anchor at the point of isometry and then doing a tib-ant allograft that we would take down to a blind tibial tunnel.  There will also be repair  The risks benefits and alternatives were discussed with the patient including but not limited to the risks of nonoperative treatment, versus surgical intervention including infection, bleeding, nerve injury,  blood clots, cardiopulmonary complications,  morbidity, mortality, among others, and they were willing to proceed.   The patient acknowledged the explanation, agreed to proceed with the plan and consent was signed.   Operative Plan: Right knee scope with debridement and MCL repair vs reconstruction with allograft  Discharge Medications: Standard DVT Prophylaxis: Aspirin Physical Therapy: Outpatient PT Special Discharge needs: Knee immobilizer   Ethelda Chick, PA-C  09/21/2020 4:36 PM

## 2020-09-22 NOTE — Anesthesia Preprocedure Evaluation (Addendum)
Anesthesia Evaluation  Patient identified by MRN, date of birth, ID band Patient awake    Reviewed: Allergy & Precautions, NPO status , Patient's Chart, lab work & pertinent test results  Airway Mallampati: II  TM Distance: >3 FB Neck ROM: Full    Dental  (+) Dental Advisory Given   Pulmonary neg pulmonary ROS,    breath sounds clear to auscultation       Cardiovascular negative cardio ROS   Rhythm:Regular Rate:Normal     Neuro/Psych PSYCHIATRIC DISORDERS Anxiety Depression negative neurological ROS     GI/Hepatic negative GI ROS, Neg liver ROS,   Endo/Other  negative endocrine ROS  Renal/GU negative Renal ROS  negative genitourinary   Musculoskeletal negative musculoskeletal ROS (+)   Abdominal   Peds  Hematology negative hematology ROS (+)   Anesthesia Other Findings   Reproductive/Obstetrics                            Anesthesia Physical  Anesthesia Plan  ASA: I  Anesthesia Plan: General   Post-op Pain Management:  Regional for Post-op pain   Induction: Intravenous  PONV Risk Score and Plan: 3 and Ondansetron, Dexamethasone and Midazolam  Airway Management Planned: LMA  Additional Equipment:   Intra-op Plan:   Post-operative Plan: Extubation in OR  Informed Consent: I have reviewed the patients History and Physical, chart, labs and discussed the procedure including the risks, benefits and alternatives for the proposed anesthesia with the patient or authorized representative who has indicated his/her understanding and acceptance.     Dental advisory given  Plan Discussed with: CRNA  Anesthesia Plan Comments:        Anesthesia Quick Evaluation

## 2020-09-23 ENCOUNTER — Other Ambulatory Visit (HOSPITAL_BASED_OUTPATIENT_CLINIC_OR_DEPARTMENT_OTHER): Payer: Self-pay | Admitting: Physician Assistant

## 2020-09-23 ENCOUNTER — Ambulatory Visit (HOSPITAL_BASED_OUTPATIENT_CLINIC_OR_DEPARTMENT_OTHER): Payer: 59 | Admitting: Anesthesiology

## 2020-09-23 ENCOUNTER — Ambulatory Visit (HOSPITAL_BASED_OUTPATIENT_CLINIC_OR_DEPARTMENT_OTHER)
Admission: RE | Admit: 2020-09-23 | Discharge: 2020-09-23 | Disposition: A | Discharge status: Home / Self Care | Payer: 59 | Attending: Orthopaedic Surgery | Admitting: Orthopaedic Surgery

## 2020-09-23 ENCOUNTER — Encounter (HOSPITAL_BASED_OUTPATIENT_CLINIC_OR_DEPARTMENT_OTHER): Payer: Self-pay | Admitting: Orthopaedic Surgery

## 2020-09-23 ENCOUNTER — Encounter (HOSPITAL_BASED_OUTPATIENT_CLINIC_OR_DEPARTMENT_OTHER)
Admission: RE | Disposition: A | Discharge status: Home / Self Care | Payer: Self-pay | Source: Home / Self Care | Attending: Orthopaedic Surgery

## 2020-09-23 DIAGNOSIS — X58XXXA Exposure to other specified factors, initial encounter: Secondary | ICD-10-CM | POA: Insufficient documentation

## 2020-09-23 DIAGNOSIS — M2351 Chronic instability of knee, right knee: Secondary | ICD-10-CM | POA: Diagnosis not present

## 2020-09-23 DIAGNOSIS — G8918 Other acute postprocedural pain: Secondary | ICD-10-CM | POA: Diagnosis not present

## 2020-09-23 DIAGNOSIS — Z888 Allergy status to other drugs, medicaments and biological substances status: Secondary | ICD-10-CM | POA: Diagnosis not present

## 2020-09-23 DIAGNOSIS — S83411A Sprain of medial collateral ligament of right knee, initial encounter: Secondary | ICD-10-CM | POA: Diagnosis not present

## 2020-09-23 DIAGNOSIS — Z885 Allergy status to narcotic agent status: Secondary | ICD-10-CM | POA: Insufficient documentation

## 2020-09-23 DIAGNOSIS — M2241 Chondromalacia patellae, right knee: Secondary | ICD-10-CM | POA: Diagnosis not present

## 2020-09-23 HISTORY — PX: KNEE ARTHROSCOPY WITH MEDIAL COLLATERAL LIGAMENT RECONSTRUCTION: SHX5650

## 2020-09-23 LAB — POCT PREGNANCY, URINE: Preg Test, Ur: NEGATIVE

## 2020-09-23 SURGERY — ARTHROSCOPY, KNEE, WITH MCL RECONSTRUCTION
Anesthesia: General | Site: Knee | Laterality: Right

## 2020-09-23 MED ORDER — ONDANSETRON HCL 4 MG PO TABS: 4.0000 mg | ORAL_TABLET | Freq: Three times a day (TID) | ORAL | 1 refills | Status: DC | PRN

## 2020-09-23 MED ORDER — ACETAMINOPHEN 500 MG PO TABS
ORAL_TABLET | ORAL | Status: AC
  Filled 2020-09-23: qty 2

## 2020-09-23 MED ORDER — FENTANYL CITRATE (PF) 100 MCG/2ML IJ SOLN
INTRAMUSCULAR | Status: AC
  Filled 2020-09-23: qty 2

## 2020-09-23 MED ORDER — SODIUM CHLORIDE 0.9 % IR SOLN
Status: DC | PRN
  Administered 2020-09-23: 200 mL

## 2020-09-23 MED ORDER — ACETAMINOPHEN 500 MG PO TABS
1000.0000 mg | ORAL_TABLET | Freq: Once | ORAL | Status: AC
  Administered 2020-09-23: 1000 mg via ORAL

## 2020-09-23 MED ORDER — PROPOFOL 500 MG/50ML IV EMUL
INTRAVENOUS | Status: AC
  Filled 2020-09-23: qty 50

## 2020-09-23 MED ORDER — FENTANYL CITRATE (PF) 100 MCG/2ML IJ SOLN
50.0000 ug | Freq: Once | INTRAMUSCULAR | Status: AC
  Administered 2020-09-23: 50 ug via INTRAVENOUS

## 2020-09-23 MED ORDER — AMISULPRIDE (ANTIEMETIC) 5 MG/2ML IV SOLN
10.0000 mg | Freq: Once | INTRAVENOUS | Status: AC | PRN
  Administered 2020-09-23: 10 mg via INTRAVENOUS

## 2020-09-23 MED ORDER — ROPIVACAINE HCL 5 MG/ML IJ SOLN
INTRAMUSCULAR | Status: DC | PRN
  Administered 2020-09-23: 25 mL via PERINEURAL

## 2020-09-23 MED ORDER — OXYCODONE HCL 5 MG PO TABS
ORAL_TABLET | ORAL | 0 refills | Status: DC
Start: 2020-09-23 — End: 2020-09-23

## 2020-09-23 MED ORDER — OXYCODONE HCL 5 MG/5ML PO SOLN: 5.0000 mg | Freq: Once | ORAL | Status: DC | PRN

## 2020-09-23 MED ORDER — MIDAZOLAM HCL 2 MG/2ML IJ SOLN
2.0000 mg | Freq: Once | INTRAMUSCULAR | Status: AC
  Administered 2020-09-23: 2 mg via INTRAVENOUS

## 2020-09-23 MED ORDER — OXYCODONE HCL 5 MG PO TABS: 5.0000 mg | ORAL_TABLET | Freq: Once | ORAL | Status: DC | PRN

## 2020-09-23 MED ORDER — ACETAMINOPHEN 500 MG PO TABS: 1000.0000 mg | ORAL_TABLET | Freq: Three times a day (TID) | ORAL | 0 refills | Status: AC

## 2020-09-23 MED ORDER — ASPIRIN 81 MG PO CHEW: 81.0000 mg | CHEWABLE_TABLET | Freq: Two times a day (BID) | ORAL | 0 refills | Status: AC

## 2020-09-23 MED ORDER — BUPIVACAINE HCL (PF) 0.25 % IJ SOLN
INTRAMUSCULAR | Status: AC
  Filled 2020-09-23: qty 30

## 2020-09-23 MED ORDER — MELOXICAM 7.5 MG PO TABS: 7.5000 mg | ORAL_TABLET | Freq: Every day | ORAL | 0 refills | Status: DC

## 2020-09-23 MED ORDER — DEXAMETHASONE SODIUM PHOSPHATE 10 MG/ML IJ SOLN
INTRAMUSCULAR | Status: AC
  Filled 2020-09-23: qty 1

## 2020-09-23 MED ORDER — EPINEPHRINE PF 1 MG/ML IJ SOLN
INTRAMUSCULAR | Status: AC
  Filled 2020-09-23: qty 5

## 2020-09-23 MED ORDER — CELECOXIB 200 MG PO CAPS
200.0000 mg | ORAL_CAPSULE | Freq: Once | ORAL | Status: AC
  Administered 2020-09-23: 200 mg via ORAL

## 2020-09-23 MED ORDER — FENTANYL CITRATE (PF) 100 MCG/2ML IJ SOLN
INTRAMUSCULAR | Status: DC | PRN
  Administered 2020-09-23: 50 ug via INTRAVENOUS
  Administered 2020-09-23 (×4): 25 ug via INTRAVENOUS
  Administered 2020-09-23: 50 ug via INTRAVENOUS

## 2020-09-23 MED ORDER — ONDANSETRON HCL 4 MG/2ML IJ SOLN
INTRAMUSCULAR | Status: DC | PRN
  Administered 2020-09-23: 4 mg via INTRAVENOUS

## 2020-09-23 MED ORDER — LIDOCAINE 2% (20 MG/ML) 5 ML SYRINGE
INTRAMUSCULAR | Status: AC
  Filled 2020-09-23: qty 5

## 2020-09-23 MED ORDER — FENTANYL CITRATE (PF) 100 MCG/2ML IJ SOLN
25.0000 ug | INTRAMUSCULAR | Status: DC | PRN
  Administered 2020-09-23 (×2): 50 ug via INTRAVENOUS

## 2020-09-23 MED ORDER — CEFAZOLIN SODIUM-DEXTROSE 2-4 GM/100ML-% IV SOLN
INTRAVENOUS | Status: AC
  Filled 2020-09-23: qty 100

## 2020-09-23 MED ORDER — CLONIDINE HCL (ANALGESIA) 100 MCG/ML EP SOLN
EPIDURAL | Status: DC | PRN
  Administered 2020-09-23: 50 ug

## 2020-09-23 MED ORDER — ONDANSETRON HCL 4 MG/2ML IJ SOLN
INTRAMUSCULAR | Status: AC
  Filled 2020-09-23: qty 2

## 2020-09-23 MED ORDER — LIDOCAINE 2% (20 MG/ML) 5 ML SYRINGE
INTRAMUSCULAR | Status: DC | PRN
  Administered 2020-09-23: 20 mg via INTRAVENOUS

## 2020-09-23 MED ORDER — VANCOMYCIN HCL 1000 MG IV SOLR
INTRAVENOUS | Status: AC
  Filled 2020-09-23: qty 2000

## 2020-09-23 MED ORDER — PROPOFOL 10 MG/ML IV BOLUS
INTRAVENOUS | Status: DC | PRN
  Administered 2020-09-23: 150 mg via INTRAVENOUS

## 2020-09-23 MED ORDER — CELECOXIB 200 MG PO CAPS
ORAL_CAPSULE | ORAL | Status: AC
  Filled 2020-09-23: qty 1

## 2020-09-23 MED ORDER — AMISULPRIDE (ANTIEMETIC) 5 MG/2ML IV SOLN
INTRAVENOUS | Status: AC
  Filled 2020-09-23: qty 2

## 2020-09-23 MED ORDER — CEFAZOLIN SODIUM-DEXTROSE 2-4 GM/100ML-% IV SOLN
2.0000 g | INTRAVENOUS | Status: AC
  Administered 2020-09-23: 2 g via INTRAVENOUS

## 2020-09-23 MED ORDER — MIDAZOLAM HCL 2 MG/2ML IJ SOLN
INTRAMUSCULAR | Status: AC
  Filled 2020-09-23: qty 2

## 2020-09-23 MED ORDER — LACTATED RINGERS IV SOLN: INTRAVENOUS | Status: DC

## 2020-09-23 MED ORDER — VANCOMYCIN HCL 1000 MG IV SOLR
INTRAVENOUS | Status: DC | PRN
  Administered 2020-09-23: 1000 mg via TOPICAL

## 2020-09-23 MED ORDER — DEXAMETHASONE SODIUM PHOSPHATE 4 MG/ML IJ SOLN
INTRAMUSCULAR | Status: DC | PRN
  Administered 2020-09-23: 5 mg via INTRAVENOUS

## 2020-09-23 SURGICAL SUPPLY — 85 items
ANCH SUT SWLK 19.1X5.5 CLS EL (Anchor) ×2 IMPLANT
ANCH SUT SWLK 19.1X6.25 CLS (Anchor) IMPLANT
ANCHOR PEEK SWIVEL LOCK 5.5 (Anchor) ×6 IMPLANT
ANCHOR SUT SWIVELLOK BIO (Anchor) IMPLANT
APL PRP STRL LF DISP 70% ISPRP (MISCELLANEOUS) ×1
BANDAGE ESMARK 6X9 LF (GAUZE/BANDAGES/DRESSINGS) IMPLANT
BLADE HEX COATED 2.75 (ELECTRODE) IMPLANT
BLADE SHAVER BONE 5.0MM X 13CM (MISCELLANEOUS) ×1
BLADE SHAVER BONE 5.0X13 (MISCELLANEOUS) ×2 IMPLANT
BLADE SURG 10 STRL SS (BLADE) ×3 IMPLANT
BLADE SURG 15 STRL LF DISP TIS (BLADE) ×1 IMPLANT
BLADE SURG 15 STRL SS (BLADE) ×3
BNDG CMPR 9X6 STRL LF SNTH (GAUZE/BANDAGES/DRESSINGS)
BNDG ELASTIC 6X5.8 VLCR STR LF (GAUZE/BANDAGES/DRESSINGS) ×3 IMPLANT
BNDG ESMARK 6X9 LF (GAUZE/BANDAGES/DRESSINGS)
BURR OVAL 8 FLU 4.0MM X 13CM (MISCELLANEOUS)
BURR OVAL 8 FLU 4.0X13 (MISCELLANEOUS) IMPLANT
CHLORAPREP W/TINT 26 (MISCELLANEOUS) ×3 IMPLANT
CLOSURE STERI-STRIP 1/2X4 (GAUZE/BANDAGES/DRESSINGS) ×1
CLSR STERI-STRIP ANTIMIC 1/2X4 (GAUZE/BANDAGES/DRESSINGS) ×2 IMPLANT
COOLER ICEMAN CLASSIC (MISCELLANEOUS) ×3 IMPLANT
COVER BACK TABLE 60X90IN (DRAPES) ×3 IMPLANT
COVER WAND RF STERILE (DRAPES) IMPLANT
CUFF TOURN SGL QUICK 34 (TOURNIQUET CUFF) ×3
CUFF TRNQT CYL 34X4.125X (TOURNIQUET CUFF) ×1 IMPLANT
DISSECTOR 3.5MM X 13CM CVD (MISCELLANEOUS) IMPLANT
DISSECTOR 4.0MMX13CM CVD (MISCELLANEOUS) ×3 IMPLANT
DRAPE ARTHROSCOPY W/POUCH 90 (DRAPES) ×3 IMPLANT
DRAPE IMP U-DRAPE 54X76 (DRAPES) ×3 IMPLANT
DRAPE OEC MINIVIEW 54X84 (DRAPES) ×3 IMPLANT
DRAPE TOP ARMCOVERS (MISCELLANEOUS) ×3 IMPLANT
DRAPE U-SHAPE 47X51 STRL (DRAPES) ×3 IMPLANT
ELECT REM PT RETURN 9FT ADLT (ELECTROSURGICAL) ×3
ELECTRODE REM PT RTRN 9FT ADLT (ELECTROSURGICAL) ×1 IMPLANT
GAUZE SPONGE 4X4 12PLY STRL (GAUZE/BANDAGES/DRESSINGS) ×3 IMPLANT
GLOVE BIO SURGEON STRL SZ 6.5 (GLOVE) ×2 IMPLANT
GLOVE BIO SURGEON STRL SZ7 (GLOVE) ×6 IMPLANT
GLOVE BIO SURGEONS STRL SZ 6.5 (GLOVE) ×1
GLOVE BIOGEL PI IND STRL 6.5 (GLOVE) ×1 IMPLANT
GLOVE BIOGEL PI IND STRL 8 (GLOVE) ×1 IMPLANT
GLOVE BIOGEL PI INDICATOR 6.5 (GLOVE) ×2
GLOVE BIOGEL PI INDICATOR 8 (GLOVE) ×2
GLOVE ECLIPSE 6.5 STRL STRAW (GLOVE) ×3 IMPLANT
GLOVE ECLIPSE 8.0 STRL XLNG CF (GLOVE) ×3 IMPLANT
GOWN STRL REUS W/ TWL LRG LVL3 (GOWN DISPOSABLE) ×2 IMPLANT
GOWN STRL REUS W/TWL LRG LVL3 (GOWN DISPOSABLE) ×6
GOWN STRL REUS W/TWL XL LVL3 (GOWN DISPOSABLE) ×3 IMPLANT
IMMOBILIZER KNEE 22 UNIV (SOFTGOODS) IMPLANT
IMMOBILIZER KNEE 24 THIGH 36 (MISCELLANEOUS) IMPLANT
IMMOBILIZER KNEE 24 UNIV (MISCELLANEOUS)
KIT BIO-TENODESIS 3X8 DISP (MISCELLANEOUS)
KIT INSRT BABSR STRL DISP BTN (MISCELLANEOUS) IMPLANT
KIT LEG STABILIZATION (KITS) IMPLANT
KIT TRANSTIBIAL (DISPOSABLE) ×3 IMPLANT
MANIFOLD NEPTUNE II (INSTRUMENTS) ×3 IMPLANT
NDL SAFETY ECLIPSE 18X1.5 (NEEDLE) ×1 IMPLANT
NDL SUT 6 .5 CRC .975X.05 MAYO (NEEDLE) ×1 IMPLANT
NEEDLE HYPO 18GX1.5 SHARP (NEEDLE) ×3
NEEDLE MAYO TAPER (NEEDLE) ×3
PACK ARTHROSCOPY DSU (CUSTOM PROCEDURE TRAY) ×3 IMPLANT
PACK BASIN DAY SURGERY FS (CUSTOM PROCEDURE TRAY) ×3 IMPLANT
PAD COLD SHLDR WRAP-ON (PAD) ×3 IMPLANT
PENCIL SMOKE EVACUATOR (MISCELLANEOUS) IMPLANT
PORT APPOLLO RF 90DEGREE MULTI (SURGICAL WAND) IMPLANT
SHEET MEDIUM DRAPE 40X70 STRL (DRAPES) ×3 IMPLANT
SPONGE LAP 4X18 RFD (DISPOSABLE) ×3 IMPLANT
SUT FIBERWIRE #2 38 T-5 BLUE (SUTURE) ×3
SUT MNCRL AB 3-0 PS2 18 (SUTURE) IMPLANT
SUT MNCRL AB 4-0 PS2 18 (SUTURE) ×3 IMPLANT
SUT VIC AB 0 CT1 27 (SUTURE) ×6
SUT VIC AB 0 CT1 27XBRD ANBCTR (SUTURE) ×2 IMPLANT
SUT VIC AB 0 SH 27 (SUTURE) ×3 IMPLANT
SUT VIC AB 2-0 SH 27 (SUTURE)
SUT VIC AB 2-0 SH 27XBRD (SUTURE) IMPLANT
SUT VIC AB 3-0 SH 27 (SUTURE) ×3
SUT VIC AB 3-0 SH 27X BRD (SUTURE) ×1 IMPLANT
SUTURE FIBERWR #2 38 T-5 BLUE (SUTURE) ×1 IMPLANT
SUTURE TAPE 1.3 FIBERLOP 20 ST (SUTURE) ×2 IMPLANT
SUTURETAPE 1.3 FIBERLOOP 20 ST (SUTURE) ×6
SYR 5ML LL (SYRINGE) ×3 IMPLANT
TENDON ANTERIOR TIBIALIS (Tissue) ×3 IMPLANT
TOWEL GREEN STERILE FF (TOWEL DISPOSABLE) ×9 IMPLANT
TUBE SUCTION HIGH CAP CLEAR NV (SUCTIONS) ×3 IMPLANT
TUBING ARTHROSCOPY IRRIG 16FT (MISCELLANEOUS) ×3 IMPLANT
WRAP KNEE MAXI GEL POST OP (GAUZE/BANDAGES/DRESSINGS) IMPLANT

## 2020-09-23 NOTE — Op Note (Signed)
Orthopaedic Surgery Operative Note (CSN: [id number])  Dawn Hamilton  09/22/1976 Date of Surgery: 09/23/2020   Diagnoses:  Right MCL rupture.  Procedure: Right MCL reconstruction with tibialis anterior allograft   Operative Finding Exam under anesthesia: Full motion no limitation, grade 2 opening to a valgus stress compared to grade 1 opening on contralateral side there was an endpoint however it was asymmetric. Suprapatellar pouch: Normal Patellofemoral Compartment: Grade 2 chondral changes and grade 3 chondral changes of the medial facet of the patella, there was some cartilage changes in the far medial aspect of the trochlea this did not appear to be articular.   Medial Compartment: Normal Lateral Compartment: Normal Intercondylar Notch: Normal  Successful completion of the planned procedure.  Patient's MCL was ruptured and we took great care to avoid tunnel convergence issues with her MPFL tunnel.  We are able to perform the [facility] reconstruction augmenting to the native tissues and avoidance of the MPFL tunnel was complete checking on fluoroscopy.  Post-operative plan: The patient will be discharged home.  The patient will be touchdown weightbearing for a week in a brace and then weightbearing to tolerance in the brace.  DVT prophylaxis Aspirin 81 mg twice daily for 6 weeks.  Pain control with PRN pain medication preferring oral medicines.  Follow up plan will be scheduled in approximately 7 days for incision check and XR.  Post-Op Diagnosis: Same Surgeons:Primary: Hiram Gash, MD Assistants:Caroline McBane PA-C Location: [place] OR ROOM 6 Anesthesia: General with regional anesthesia Antibiotics: Ancef 2 g with local vancomycin powder 1 g at the surgical site Tourniquet time: 60 Estimated Blood Loss: Minimal Complications: None Specimens: None Implants: Implant Name Type Inv. Item Serial No. Manufacturer Lot No. LRB No. Used Action  TENDON ANTERIOR TIBIALIS - [id number]  Tissue TENDON ANTERIOR TIBIALIS [id number] [employer] [id number] Right 1 Implanted  ANCHOR PEEK SWIVEL LOCK 5.5 - [id number] Anchor ANCHOR PEEK SWIVEL LOCK 5.5  [employer] [id number] Right 1 Implanted  ANCHOR PEEK SWIVEL LOCK 5.5 - [id number] Anchor ANCHOR PEEK SWIVEL LOCK 5.5  [employer] [id number] Right 1 Implanted    Indications for Surgery:   Dawn Hamilton is a 44 y.o. female with previous MPFL reconstruction and injury afterwards resulting in MCL injury.  She tried nonoperative measures for extended period time but continued medial proximal pain at the area of her MCL injury.  Benefits and risks of operative and nonoperative management were discussed prior to surgery with patient/guardian(s) and informed consent form was completed.  Specific risks including infection, need for additional surgery, continued pain, instability, stiffness amongst others   Procedure:   The patient was identified properly. Informed consent was obtained and the surgical site was marked. The patient was taken up to suite where general anesthesia was induced. The patient was placed in the supine position with a post against the surgical leg and a nonsterile tourniquet applied. The surgical leg was then prepped and draped usual sterile fashion.  A standard surgical timeout was performed.  2 standard anterior portals were made and diagnostic arthroscopy performed. Please note the findings as noted above.  We performed diagnostic arthroscopy and a gentle chondroplasty performed but there was no other obvious findings within the joint.  At that point turned attention to the medial side of the knee.  We used her MPFL proximal femoral incision and extended it proximally and distally.  We got the skin sharply achieving hemostasis progressed.  We were careful to dissect bluntly through the subcutaneous tissues  to avoid damage to the saphenous nerve and identified the MPFL graft.  At that point we also  identified the remnant MCL which was near completely torn and was retracted about a centimeter and a half.  We then were able to identify points of isometry proximal to her MPFL tunnel and on the tibia.  We verified with fluoroscopy to verify that we are not entered tearing with our MPFL tunnel.  We then prepared a 6.5 mm tibialis anterior graft and drilled a 7 mm blind tunnel into the femur and obtained purchase with a 5.5 mm swivel lock anchor.  This was a peek anchor.  Once we had performed that we tunneled the graft underneath the skin and above layer to and were able to drill and passed our graft into a blind tunnel in the tibia with good isometry.  With good tension on the graft and we held this in place with a 5.5 peek swivel lock anchor.  Once is complete we used Vicryl sutures to imbricate into the native MCL tissue and tension our graft once more.  We had good stability at the end the case with no graft loosening.  Incisions closed with absorbable suture. The patient was awoken from general anesthesia and taken to the PACU in stable condition without complication.   Dawn Chapel, PA-C, present and scrubbed throughout the case, critical for completion in a timely fashion, and for retraction, instrumentation, closure.

## 2020-09-23 NOTE — Discharge Instructions (Signed)
  Post Anesthesia Home Care Instructions  Activity: Get plenty of rest for the remainder of the day. A responsible individual must stay with you for 24 hours following the procedure.  For the next 24 hours, DO NOT: -Drive a car -Paediatric nurse -Drink alcoholic beverages -Take any medication unless instructed by your physician -Make any legal decisions or sign important papers.  Meals: Start with liquid foods such as gelatin or soup. Progress to regular foods as tolerated. Avoid greasy, spicy, heavy foods. If nausea and/or vomiting occur, drink only clear liquids until the nausea and/or vomiting subsides. Call your physician if vomiting continues.  Special Instructions/Symptoms: Your throat may feel dry or sore from the anesthesia or the breathing tube placed in your throat during surgery. If this causes discomfort, gargle with warm salt water. The discomfort should disappear within 24 hours.  If you had a scopolamine patch placed behind your ear for the management of post- operative nausea and/or vomiting:  1. The medication in the patch is effective for 72 hours, after which it should be removed.  Wrap patch in a tissue and discard in the trash. Wash hands thoroughly with soap and water. 2. You may remove the patch earlier than 72 hours if you experience unpleasant side effects which may include dry mouth, dizziness or visual disturbances. 3. Avoid touching the patch. Wash your hands with soap and water after contact with the patch.     Regional Anesthesia Blocks  1. Numbness or the inability to move the "blocked" extremity may last from 3-48 hours after placement. The length of time depends on the medication injected and your individual response to the medication. If the numbness is not going away after 48 hours, call your surgeon.  2. The extremity that is blocked will need to be protected until the numbness is gone and the  Strength has returned. Because you cannot feel it, you  will need to take extra care to avoid injury. Because it may be weak, you may have difficulty moving it or using it. You may not know what position it is in without looking at it while the block is in effect.  3. For blocks in the legs and feet, returning to weight bearing and walking needs to be done carefully. You will need to wait until the numbness is entirely gone and the strength has returned. You should be able to move your leg and foot normally before you try and bear weight or walk. You will need someone to be with you when you first try to ensure you do not fall and possibly risk injury.  4. Bruising and tenderness at the needle site are common side effects and will resolve in a few days.  5. Persistent numbness or new problems with movement should be communicated to the surgeon or the [facility] [phone] [place] [phone]).  No tylenol until after 1pm if needed.  No ibuprofen until after 3pm if needed.

## 2020-09-23 NOTE — Progress Notes (Signed)
AssistedDr. Rob Fitzgerald with right, ultrasound guided, adductor canal block. Side rails up, monitors on throughout procedure. See vital signs in flow sheet. Tolerated Procedure well.  

## 2020-09-23 NOTE — Interval H&P Note (Signed)
History and Physical Interval Note:  09/23/2020 7:24 AM  Dawn Hamilton  has presented today for surgery, with the diagnosis of [place].  The various methods of treatment have been discussed with the patient and family. After consideration of risks, benefits and other options for treatment, the patient has consented to  Procedure(s): RIGHT KNEE ARTHROSCOPY DEBRIDMENT  WITH MEDIAL COLLATERAL LIGAMENT RECONSTRUCTION (Right) as a surgical intervention.  The patient's history has been reviewed, patient examined, no change in status, stable for surgery.  I have reviewed the patient's chart and labs.  Questions were answered to the patient's satisfaction.     Hiram Gash

## 2020-09-23 NOTE — Anesthesia Procedure Notes (Signed)
Procedure Name: LMA Insertion Date/Time: 09/23/2020 7:31 AM Performed by: Maryella Shivers, CRNA Pre-anesthesia Checklist: Patient identified, Emergency Drugs available, Suction available and Patient being monitored Patient Re-evaluated:Patient Re-evaluated prior to induction Oxygen Delivery Method: Circle system utilized Preoxygenation: Pre-oxygenation with 100% oxygen Induction Type: IV induction Ventilation: Mask ventilation without difficulty LMA: LMA inserted LMA Size: 4.0 Number of attempts: 1 Airway Equipment and Method: Bite block Placement Confirmation: positive ETCO2 Tube secured with: Tape Dental Injury: Teeth and Oropharynx as per pre-operative assessment

## 2020-09-23 NOTE — Anesthesia Postprocedure Evaluation (Signed)
Anesthesia Post Note  Patient: Dawn Hamilton  Procedure(s) Performed: RIGHT KNEE ARTHROSCOPY DEBRIDMENT  WITH MEDIAL COLLATERAL LIGAMENT RECONSTRUCTION (Right Knee)     Patient location during evaluation: PACU Anesthesia Type: General Level of consciousness: awake and alert Pain management: pain level controlled Vital Signs Assessment: post-procedure vital signs reviewed and stable Respiratory status: spontaneous breathing, nonlabored ventilation, respiratory function stable and patient connected to nasal cannula oxygen Cardiovascular status: blood pressure returned to baseline and stable Postop Assessment: no apparent nausea or vomiting Anesthetic complications: no   No complications documented.  Last Vitals:  Vitals:   09/23/20 0945 09/23/20 1026  BP: 112/79 118/75  Pulse: 74 72  Resp: 12 16  Temp:  36.6 C  SpO2: 98% 100%    Last Pain:  Vitals:   09/23/20 1026  TempSrc:   PainSc: 3                  Tiajuana Amass

## 2020-09-23 NOTE — Anesthesia Procedure Notes (Signed)
Anesthesia Regional Block: Adductor canal block   Pre-Anesthetic Checklist: ,, timeout performed, Correct Patient, Correct Site, Correct Laterality, Correct Procedure, Correct Position, site marked, Risks and benefits discussed,  Surgical consent,  Pre-op evaluation,  At surgeon's request and post-op pain management  Laterality: Right  Prep: chloraprep       Needles:  Injection technique: Single-shot  Needle Type: Echogenic Needle     Needle Length: 9cm  Needle Gauge: 21     Additional Needles:   Procedures:,,,, ultrasound used (permanent image in chart),,,,  Narrative:  Start time: 09/23/2020 7:05 AM End time: 09/23/2020 7:11 AM Injection made incrementally with aspirations every 5 mL.  Performed by: Personally  Anesthesiologist: Suzette Battiest, MD

## 2020-09-23 NOTE — Transfer of Care (Signed)
Immediate Anesthesia Transfer of Care Note  Patient: Dawn Hamilton  Procedure(s) Performed: RIGHT KNEE ARTHROSCOPY DEBRIDMENT  WITH MEDIAL COLLATERAL LIGAMENT RECONSTRUCTION (Right Knee)  Patient Location: PACU  Anesthesia Type:GA combined with regional for post-op pain  Level of Consciousness: sedated  Airway & Oxygen Therapy: Patient Spontanous Breathing and Patient connected to face mask oxygen  Post-op Assessment: Report given to RN and Post -op Vital signs reviewed and stable  Post vital signs: Reviewed and stable  Last Vitals:  Vitals Value Taken Time  BP 126/84 09/23/20 0904  Temp    Pulse 94 09/23/20 0907  Resp 16 09/23/20 0907  SpO2 100 % 09/23/20 0907  Vitals shown include unvalidated device data.  Last Pain:  Vitals:   09/23/20 0651  TempSrc: Oral  PainSc: 0-No pain      Patients Stated Pain Goal: 3 (20/80/22 3361)  Complications: No complications documented.

## 2020-09-24 ENCOUNTER — Encounter (HOSPITAL_BASED_OUTPATIENT_CLINIC_OR_DEPARTMENT_OTHER): Payer: Self-pay | Admitting: Orthopaedic Surgery

## 2020-09-30 DIAGNOSIS — M2351 Chronic instability of knee, right knee: Secondary | ICD-10-CM | POA: Diagnosis not present

## 2020-10-01 ENCOUNTER — Other Ambulatory Visit: Payer: Self-pay

## 2020-10-01 ENCOUNTER — Ambulatory Visit: Payer: 59 | Attending: Orthopaedic Surgery

## 2020-10-01 DIAGNOSIS — M6281 Muscle weakness (generalized): Secondary | ICD-10-CM | POA: Insufficient documentation

## 2020-10-01 DIAGNOSIS — M25561 Pain in right knee: Secondary | ICD-10-CM | POA: Diagnosis not present

## 2020-10-01 DIAGNOSIS — M25661 Stiffness of right knee, not elsewhere classified: Secondary | ICD-10-CM | POA: Diagnosis not present

## 2020-10-01 DIAGNOSIS — R2689 Other abnormalities of gait and mobility: Secondary | ICD-10-CM | POA: Diagnosis not present

## 2020-10-01 NOTE — Therapy (Signed)
[place] MAIN [facility] SERVICES [address] [place], [place], [address] Phone: [phone]   Fax:  [phone]  Physical Therapy Evaluation  Patient Details  Name: Dawn Hamilton MRN: [id number] Date of Birth: [birth year] Referring Provider (PT): Hiram Gash   Encounter Date: 10/01/2020   PT End of Session - 10/01/20 1209    Visit Number 1    Number of Visits 12    Date for PT Re-Evaluation 11/12/20    Authorization Type 1/10 eval 10/01/20    PT Start Time 0800    PT Stop Time 0842    PT Time Calculation (min) 42 min    Equipment Utilized During Treatment Right knee immobilizer    Activity Tolerance Patient tolerated treatment well    Behavior During Therapy [facility] for tasks assessed/performed           Past Medical History:  Diagnosis Date  . Allergy   . Anxiety   . Depression   . Precancerous skin lesion     Past Surgical History:  Procedure Laterality Date  . KNEE ARTHROSCOPY WITH MEDIAL COLLATERAL LIGAMENT RECONSTRUCTION Right 09/23/2020   Procedure: RIGHT KNEE ARTHROSCOPY DEBRIDMENT  WITH MEDIAL COLLATERAL LIGAMENT RECONSTRUCTION;  Surgeon: Hiram Gash, MD;  Location: [place];  Service: Orthopedics;  Laterality: Right;  . KNEE ARTHROSCOPY WITH MEDIAL PATELLAR FEMORAL LIGAMENT RECONSTRUCTION Right 10/02/2019   Procedure: KNEE ARTHROSCOPY WITH MEDIAL PATELLAR FEMORAL LIGAMENT RECONSTRUCTION WITH ALLOGRAPH;  Surgeon: Hiram Gash, MD;  Location: [place];  Service: Orthopedics;  Laterality: Right;  . KNEE SURGERY     x3    There were no vitals filed for this visit.    Subjective Assessment - 10/01/20 1200    Subjective Patient is a very pleasant 44 year old female s/p Right MCL reconstruction with tibialis anterior allograft on 09/23/20    Pertinent History Patient is a 44 year old female s/p Right MCL reconstruction with tibialis anterior allograft on 09/23/20. 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 Last march she was having some pain and an MRI of the R knee showed positive MCl sprain but with no full thickness MCL injury. She was treated conservatively with J&J brace. She did not have any improvements and had worsening of sharp pains. 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    Limitations Sitting;Lifting;Standing;Walking;House hold activities    How long can you sit comfortably? requires J & J brace, uncomfortable for prolonged sitting    How long can you stand comfortably? WBAT with J &J brace    How long can you walk comfortably? today is first day WBAT with J& J brace    Patient Stated Goals to return to PLOF.    Currently in Pain? Yes    Pain Score 1     Pain Location Knee    Pain Orientation Right    Pain Descriptors / Indicators Aching    Pain Type Surgical pain    Pain Onset In the past 7 days    Pain Frequency Constant    Aggravating Factors  movement, palpation of surgical site    Pain Relieving  Factors ice, rest, medication             The patient will be touchdown weightbearing for a week in a brace and then weightbearing to tolerance in the brace.  DVT prophylaxis Aspirin 81 mg twice daily for 6 weeks.  Pain control with PRN pain medication preferring oral medicines.  Follow up plan will be scheduled in approximately 7 days for incision check and XR. Patient is to wear brace at all times except at PT and showering    [facility] PT Assessment - 10/01/20 0001      Assessment   Medical Diagnosis Right MCL reconstruction with tibialis anterior allograft    Referring Provider (PT) Ophelia Charter T     Onset Date/Surgical Date 09/23/20    Prior Therapy yes      Precautions   Precautions Knee    Precaution Booklet Issued Yes (comment)    Precaution Comments WBAT with J & J brace    Required Braces or Orthoses Knee Immobilizer - Right    Knee Immobilizer - Right On when out of bed or walking      Restrictions   Weight Bearing Restrictions Yes    RLE Weight Bearing Weight bearing as tolerated    Other Position/Activity Restrictions WBAT with J & J brace      Balance Screen   Has the patient fallen in the past 6 months No    Has the patient had a decrease in activity level because of a fear of falling?  Yes    Is the patient reluctant to leave their home because of a fear of falling?  No      Home Social worker Private residence    Living Arrangements Spouse/significant other;Children    Available Help at Discharge Family    Type of [place] to enter    Entrance Stairs-Number of Steps 2    Entrance Stairs-Rails None    Home Layout Two level    Alternate Level Stairs-Number of Steps 16    Alternate Level Stairs-Rails Right    Home Equipment None      Prior Function   Level of Independence Independent    Vocation Full time employment    Secretary/administrator    Leisure play with kids, workout       Cognition   Overall Cognitive Status Within Functional Limits for tasks assessed      Observation/Other Assessments   Focus on Therapeutic Outcomes (FOTO)  43%              PAIN R knee:  Pre surgery 5/10 Post surgery 1-2/10  Currently on medication for pain control: Tylenol and Advil   POSTURE: Seated: weight shift onto L glute  Standing: J &J brace on R knee, locked at 30-40 degrees, limited weight shift onto RLE.   PROM/AROM: supine only due to surgical precaution  PROM BLE Flexion 68 Extension deferred due to 1 week post op    AROM BLE:   Hip: WFL with slight deficit of R hip extension due  to tightness of iliopsoas  Knee:  Flexion: 57 degrees, nonpainful  Extension: -1 with towel under distal aspect of calf  Ankle: WFL with slight muscle tissue tightness of R gastroc/soleous   Accessory Motions:  Patella: grade I mobilizations performed: empty due to swelling:  Knee accessory mobs deferred due to s/p 1 week post op  STRENGTH:  Graded on a  0-5 scale Muscle Group Left Right  Ankle DF 5/5 4/5  Ankle PF 5/5 4/5   Hip and knee strength deferred due to s/p 1 week post op  SENSATION:   BLE :   NEUROLOGICAL SCREEN: (2+ unless otherwise noted.) N=normal  Ab=abnormal   Level Dermatome R L  C3 Anterior Neck  N N  C4 Top of Shoulder N N  C5 Lateral Upper Arm  N N  C6 Lateral Arm/ Thumb  N N  C7 Middle Finger  N N  C8 4th & 5th Finger N N  T1 Medial Arm N N  L2 Medial thigh/groin N N  L3 Lower thigh/med.knee Ab N  L4 Medial leg/lat thigh Ab N  L5 Lat. leg & dorsal foot N N  S1 post/lat foot/thigh/leg N N  S2 Post./med. thigh & leg N N    SOMATOSENSORY:  Any N & T in extremities or weakness: reports :         Sensation           Intact      Diminished         Absent  Light touch LEs R mid medial calf numb                             COORDINATION:          Heel Shin Slide Test: deferred due to s/p 1 week post op     FUNCTIONAL MOBILITY: Sit to stand: with J&J brace on R knee: weight shift onto LLE, able to perform 1-2 without UE support  Sit <>supine: mod I with extra time due to J& J brace on R knee.   Supine <>prone: mod I with extra time due to J&J brace on R knee  BALANCE: Static Sitting Balance  Normal Able to maintain balance against maximal resistance x  Good Able to maintain balance against moderate resistance   Good-/Fair+ Accepts minimal resistance   Fair Able to sit unsupported without balance loss and without UE support   Poor+ Able to maintain with Minimal assistance from individual or chair   Poor Unable to maintain balance-requires  mod/max support from individual or chair    Static Standing Balance  Normal Able to maintain standing balance against maximal resistance   Good Able to maintain standing balance against moderate resistance   Good-/Fair+ Able to maintain standing balance against minimal resistance x  Fair Able to stand unsupported without UE support and without LOB for 1-2 min   Fair- Requires Min A and UE support to maintain standing without loss of balance   Poor+ Requires mod A and UE support to maintain standing without loss of balance   Poor Requires max A and UE support to maintain standing balance without loss    Dynamic Sitting Balance  Normal Able to sit unsupported and weight shift across midline maximally x  Good Able to sit unsupported and weight shift across midline moderately   Good-/Fair+ Able to sit unsupported and weight shift across midline minimally   Fair Minimal weight shifting ipsilateral/front, difficulty crossing midline   Fair- Reach to ipsilateral side and unable to weight shift   Poor + Able to sit unsupported with min A and reach to ipsilateral side, unable to weight shift   Poor Able to sit unsupported with mod A and reach ipsilateral/front-can't cross midline    Standing Dynamic Balance  Normal Stand independently unsupported, able to weight  shift and cross midline maximally   Good Stand independently unsupported, able to weight shift and cross midline moderately   Good-/Fair+ Stand independently unsupported, able to weight shift across midline minimally x  Fair Stand independently unsupported, weight shift, and reach ipsilaterally, loss of balance when crossing midline   Poor+ Able to stand with Min A and reach ipsilaterally, unable to weight shift   Poor Able to stand with Mod A and minimally reach ipsilaterally, unable to cross midline.      GAIT: Ambulate without an AD, J&J brace on R knee locked to 30-40 degrees, limited weight shift onto RLE. Good heel toe strike  however increased circumduction with increased gait speed.    OUTCOME MEASURES: TEST Outcome Interpretation  5 times sit<>stand 18.78 sec >60 yo, >15 sec indicates increased risk for falls  10 meter walk test        Standard 12.78 seconds Fast 9.73 seconds          m/s <1.0 m/s indicates increased risk for falls; limited community ambulator  LEFS 32/80   FOTO 43%,  Predicted discharge score of 67%         Objective measurements completed on examination: See above findings.          Access Code: [email or web address] URL: [email or web address] Date: 10/01/2020 Prepared by: Janna Arch  Exercises Quad Set - 1 x daily - 6 x weekly - 2 sets - 15-20 reps - 5 hold Heel Prop - 1 x daily - 7 x weekly - 2 sets - 1 reps - 5 min hold Supine Heel Slide with Strap - 1 x daily - 7 x weekly - 2 sets - 10 reps - 5 hold Supine Active Straight Leg Raise - 1 x daily - 7 x weekly - 3 sets - 10 reps - 5 hold Supine Knee Extension Strengthening - 1 x daily - 7 x weekly - 3 sets - 10 reps - 5 hold Standing Knee Flexion AROM with Chair Support - 1 x daily - 7 x weekly - 3 sets - 10 reps - 5 hold Standing Toe Raises at Chair - 1 x daily - 7 x weekly - 3 sets - 10 reps - 5 hold Supine Hip Abduction - 1 x daily - 7 x weekly - 3 sets - 10 reps - 5 hold Wall Quarter Squat - 1 x daily - 7 x weekly - 2 sets - 10 reps - 5 hold   Ice applied while patient filled out LEFS       PT Education - 10/01/20 1209    Education Details goals, POC, HEP    Person(s) Educated Patient    Methods Explanation;Demonstration;Verbal cues;Tactile cues;Handout    Comprehension Verbalized understanding;Returned demonstration;Verbal cues required;Tactile cues required            PT Short Term Goals - 10/01/20 1214      PT SHORT TERM GOAL #1   Title Patient will be independent in home exercise program to improve strength/mobility for better functional independence with ADLs.    Baseline 11/19: HEP given     Time 2    Period Weeks    Status New    Target Date 10/15/20      PT SHORT TERM GOAL #2   Title Patient will demonstrate 90 degrees of flexion and neutral knee extension with active range of motion of R knee for improved gait mechanics and muscular activation/alignment    Baseline 11/19: 57 flexion -1 extension  Time 2    Period Weeks    Status New    Target Date 10/15/20             PT Long Term Goals - 10/01/20 1216      PT LONG TERM GOAL #1   Title Patient will demonstrate >/=120 degrees of flexion and neutral extension with active range of motion of R knee  for improved gait mechanics and muscular activation/alignment and return to functional ROM    Baseline 11/19: 57 flexion, -1 extension    Time 6    Period Weeks    Status New    Target Date 11/12/20      PT LONG TERM GOAL #2   Title Patient will increase BLE gross strength to 4+/5 as to improve functional strength for independent gait, increased standing tolerance and increased ADL ability.    Baseline 11/19: see note    Time 6    Period Weeks    Status New    Target Date 11/12/20      PT LONG TERM GOAL #3   Title Patient will increase lower extremity functional scale to >60/80 to demonstrate improved functional mobility and increased tolerance with ADLs.    Baseline 11/19: 32/808    Time 6    Period Weeks    Status New    Target Date 11/12/20      PT LONG TERM GOAL #4   Title Patient will increase FOTO score to equal to or greater than   67%  to demonstrate statistically significant improvement in mobility and quality of life.    Baseline 11/19: 43%    Time 6    Period Weeks    Status New    Target Date 11/12/20      PT LONG TERM GOAL #5   Title Patient will perform a squat with equal weight shift and no LOB or increase of pain >1 degree on the VAS scale for functional activty performance and return to PLOF.    Baseline 11/19: unable    Time 6    Period Weeks    Status New    Target Date 11/12/20       PT LONG TERM GOAL #6   Title Patient will perform the 6 minute walk test for >1000 ft with no episodes of hip hiking, vaulting, or antalgia for return to PLOF to be within functional norms for age.    Baseline 11/19: unable to perform    Time 6    Period Weeks    Status New    Target Date 11/12/20                  Plan - 10/01/20 1212    Clinical Impression Statement Patient is a very pleasant 45 year old female s/p Right MCL reconstruction with tibialis anterior allograft and patella chondroplasty on 09/23/20. Patient was cleared for WBAT yesterday with J &J brace.  Per surgeon physical therapy protocol per therapist discretion with brace to be worn at all times except at PT and showering. Physician requests limited valgus strain on surgical site initially however no ROM limitations given. Patient initial evaluation is limited however due to s/p 1 week surgery limited muscle testing and mobilizations. HEP given with patient demonstrating understanding with cueing required to reduce adduction drift with repetition. Patient will benefit from skilled physical therapy to increase strength, ROM, and stabilization to RLE for return to PLOF.    Personal Factors and Comorbidities Comorbidity 2;Past/Current Experience;Profession  Comorbidities three previous MPFL surgeries, anemia    Examination-Activity Limitations Bathing;Bed Mobility;Caring for Others;Carry;Stairs;Squat;Sleep;Sit;Locomotion Level;Lift;Stand;Transfers    Examination-Participation Restrictions Cleaning;Community Activity;Meal Prep;Laundry;Shop;Yard Work;Occupation    Stability/Clinical Decision Making Stable/Uncomplicated    Clinical Decision Making Low    Rehab Potential Good    PT Frequency 2x / week    PT Duration 6 weeks    PT Treatment/Interventions ADLs/Self Care Home Management;Aquatic Therapy;Cryotherapy;Electrical Stimulation;Biofeedback;Iontophoresis 4mg /ml Dexamethasone;Moist Heat;Ultrasound;Functional  mobility training;Stair training;Gait training;DME Instruction;Therapeutic activities;Therapeutic exercise;Balance training;Neuromuscular re-education;Manual techniques;Patient/family education;Compression bandaging;Scar mobilization;Passive range of motion;Dry needling;Taping;Splinting;Energy conservation;Orthotic Fit/Training    PT Next Visit Plan ROM, strength    PT Home Exercise Plan see above    Consulted and Agree with Plan of Care Patient           Patient will benefit from skilled therapeutic intervention in order to improve the following deficits and impairments:  Abnormal gait, Decreased activity tolerance, Decreased endurance, Decreased mobility, Decreased range of motion, Difficulty walking, Decreased strength, Decreased scar mobility, Increased edema, Impaired flexibility, Impaired perceived functional ability, Improper body mechanics, Pain, Increased muscle spasms, Impaired sensation, Postural dysfunction  Visit Diagnosis: Stiffness of right knee, not elsewhere classified  Other abnormalities of gait and mobility  Muscle weakness (generalized)     Problem List Patient Active Problem List   Diagnosis Date Noted  . Vitamin B12 deficiency 12/18/2019  . Plant irritant contact dermatitis 06/17/2019  . Iron deficiency anemia 12/09/2018  . Preventative health care 12/04/2016  . Seasonal allergic rhinitis 11/27/2014   Janna Arch, PT, DPT   10/01/2020, 12:20 PM  [place] MAIN [facility] SERVICES [address] [place], [place], [address] Phone: [phone]   Fax:  [phone]  Name: Shellby Schlink MRN: [id number] Date of Birth: May 31, 1976

## 2020-10-01 NOTE — Patient Instructions (Signed)
Access Code: [email or web address] URL: [email or web address] Date: 10/01/2020 Prepared by: Janna Arch  Exercises Quad Set - 1 x daily - 6 x weekly - 2 sets - 15-20 reps - 5 hold Heel Prop - 1 x daily - 7 x weekly - 2 sets - 1 reps - 5 min hold Supine Heel Slide with Strap - 1 x daily - 7 x weekly - 2 sets - 10 reps - 5 hold Supine Active Straight Leg Raise - 1 x daily - 7 x weekly - 3 sets - 10 reps - 5 hold Supine Knee Extension Strengthening - 1 x daily - 7 x weekly - 3 sets - 10 reps - 5 hold Standing Knee Flexion AROM with Chair Support - 1 x daily - 7 x weekly - 3 sets - 10 reps - 5 hold Standing Toe Raises at Chair - 1 x daily - 7 x weekly - 3 sets - 10 reps - 5 hold Supine Hip Abduction - 1 x daily - 7 x weekly - 3 sets - 10 reps - 5 hold Wall Quarter Squat - 1 x daily - 7 x weekly - 2 sets - 10 reps - 5 hold

## 2020-10-04 ENCOUNTER — Other Ambulatory Visit: Payer: Self-pay

## 2020-10-04 ENCOUNTER — Ambulatory Visit: Payer: 59

## 2020-10-04 DIAGNOSIS — R2689 Other abnormalities of gait and mobility: Secondary | ICD-10-CM | POA: Diagnosis not present

## 2020-10-04 DIAGNOSIS — M6281 Muscle weakness (generalized): Secondary | ICD-10-CM

## 2020-10-04 DIAGNOSIS — M25561 Pain in right knee: Secondary | ICD-10-CM | POA: Diagnosis not present

## 2020-10-04 DIAGNOSIS — M25661 Stiffness of right knee, not elsewhere classified: Secondary | ICD-10-CM

## 2020-10-04 NOTE — Therapy (Signed)
[place] MAIN [facility] SERVICES [address] [place], [place], [address] Phone: [phone]   Fax:  [phone]  Physical Therapy Treatment  Patient Details  Name: Dawn Hamilton MRN: [id number] Date of Birth: [birth year] Referring Provider (PT): Hiram Gash   Encounter Date: 10/04/2020   PT End of Session - 10/04/20 1236    Visit Number 2    Number of Visits 12    Date for PT Re-Evaluation 11/12/20    Authorization Type 2/10 eval 10/01/20    PT Start Time 0759    PT Stop Time 0843    PT Time Calculation (min) 44 min    Equipment Utilized During Treatment Right knee immobilizer    Activity Tolerance Patient tolerated treatment well    Behavior During Therapy [facility] for tasks assessed/performed           Past Medical History:  Diagnosis Date   Allergy    Anxiety    Depression    Precancerous skin lesion     Past Surgical History:  Procedure Laterality Date   KNEE ARTHROSCOPY WITH MEDIAL COLLATERAL LIGAMENT RECONSTRUCTION Right 09/23/2020   Procedure: RIGHT KNEE ARTHROSCOPY DEBRIDMENT  WITH MEDIAL COLLATERAL LIGAMENT RECONSTRUCTION;  Surgeon: Hiram Gash, MD;  Location: [place];  Service: Orthopedics;  Laterality: Right;   KNEE ARTHROSCOPY WITH MEDIAL PATELLAR FEMORAL LIGAMENT RECONSTRUCTION Right 10/02/2019   Procedure: KNEE ARTHROSCOPY WITH MEDIAL PATELLAR FEMORAL LIGAMENT RECONSTRUCTION WITH ALLOGRAPH;  Surgeon: Hiram Gash, MD;  Location: [place];  Service: Orthopedics;  Laterality: Right;   KNEE SURGERY     x3    There were no vitals filed for this visit.   Subjective Assessment - 10/04/20 1234    Subjective Patient reports catching her toe on the corner of her office this morning. Feels better now but was initially fearful of injury. Reports nerve pain has been present , increasing since yesterday.    Pertinent History Patient is a 44 year old female s/p Right MCL  reconstruction with tibialis anterior allograft on 09/23/20. Patient had an additional MPFL on 09/2019 and has seen this therapist for therapy afterwards; as well as additional MPFL surgery in 1996 and 2004. Last march she was having some pain and an MRI of the R knee showed positive MCl sprain but with no full thickness MCL injury. She was treated conservatively with J&J brace. She did not have any improvements and had worsening of sharp pains. 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    Limitations Sitting;Lifting;Standing;Walking;House hold activities    How long can you sit comfortably? requires J & J brace, uncomfortable for prolonged sitting    How long can you stand comfortably? WBAT with J &J brace    How long can you walk comfortably? today is first day WBAT with J& J brace    Patient Stated Goals to return to PLOF.    Currently in Pain? Yes    Pain Score 2     Pain Location Knee    Pain Orientation Right    Pain Descriptors / Indicators Aching    Pain Type Surgical pain    Pain Onset In the past 7 days    Pain Frequency Constant  Manual: Supine:  Increasing R knee flexion with overpressure at foot and under knee 10x 5 second holds R hamstring lengthening 60 seconds, second set with calf overpressure 60 seconds Patella mobilizations grade I, 4x 10 seconds each direction,  Ice cup massage x 4 minutes for swelling reduction.   Prone: R knee flexion with progressive overpressure 5x 20 seconds holds  TherEx Supine:  Contract relax RLE against PT shoulder for optimizing knee extension and flexion 10x 5 second holds  SLR with quad contraction first 10x RLE SAQ over blue bolster 12x with focus on quad contraction rather  than compensatory glute contraction Straight leg abduction slides 10x with focus on foot in neutral position  RTB 4 way ankle resistance 15x each direction RLE  Prone: R hamstring curl modified 60-90 degree holds 10x  standing: Wall squat modified with focus on weight acceptance onto RLE; cues to push through heel for return to standing position 5x 10 second holds      Pt educated throughout session about proper posture and technique with exercises. Improved exercise technique, movement at target joints, use of target muscles after min to mod verbal, visual, tactile cues                   PT Education - 10/04/20 1235    Education Details exercise technique, body mechanics, manual    Person(s) Educated Patient    Methods Explanation;Tactile cues;Demonstration;Verbal cues    Comprehension Verbalized understanding;Returned demonstration;Verbal cues required;Tactile cues required            PT Short Term Goals - 10/01/20 1214      PT SHORT TERM GOAL #1   Title Patient will be independent in home exercise program to improve strength/mobility for better functional independence with ADLs.    Baseline 11/19: HEP given    Time 2    Period Weeks    Status New    Target Date 10/15/20      PT SHORT TERM GOAL #2   Title Patient will demonstrate 90 degrees of flexion and neutral knee extension with active range of motion of R knee for improved gait mechanics and muscular activation/alignment    Baseline 11/19: 57 flexion -1 extension    Time 2    Period Weeks    Status New    Target Date 10/15/20             PT Long Term Goals - 10/01/20 1216      PT LONG TERM GOAL #1   Title Patient will demonstrate >/=120 degrees of flexion and neutral extension with active range of motion of R knee  for improved gait mechanics and muscular activation/alignment and return to functional ROM    Baseline 11/19: 57 flexion, -1 extension    Time 6    Period Weeks    Status New     Target Date 11/12/20      PT LONG TERM GOAL #2   Title Patient will increase BLE gross strength to 4+/5 as to improve functional strength for independent gait, increased standing tolerance and increased ADL ability.    Baseline 11/19: see note    Time 6    Period Weeks    Status New    Target Date 11/12/20      PT LONG TERM GOAL #3   Title Patient will increase lower extremity functional scale to >60/80 to demonstrate improved functional mobility and increased tolerance with ADLs.    Baseline 11/19: 32/808  Time 6    Period Weeks    Status New    Target Date 11/12/20      PT LONG TERM GOAL #4   Title Patient will increase FOTO score to equal to or greater than   67%  to demonstrate statistically significant improvement in mobility and quality of life.    Baseline 11/19: 43%    Time 6    Period Weeks    Status New    Target Date 11/12/20      PT LONG TERM GOAL #5   Title Patient will perform a squat with equal weight shift and no LOB or increase of pain >1 degree on the VAS scale for functional activty performance and return to PLOF.    Baseline 11/19: unable    Time 6    Period Weeks    Status New    Target Date 11/12/20      PT LONG TERM GOAL #6   Title Patient will perform the 6 minute walk test for >1000 ft with no episodes of hip hiking, vaulting, or antalgia for return to PLOF to be within functional norms for age.    Baseline 11/19: unable to perform    Time 6    Period Weeks    Status New    Target Date 11/12/20                 Plan - 10/04/20 1238    Clinical Impression Statement Patient presents to physical therapy with excellent motivation. Swelling noted in surgical area as well as radiating bruising down anterior and posterior aspect of leg. Patient introduced to increasing ROM tolerating it well with pain increases tolerated well. Cues for quad activation for reduction of compensatory gluteal activation. Patient will benefit from skilled physical  therapy to increase strength, ROM, and stabilization to RLE for return to PLOF.    Personal Factors and Comorbidities Comorbidity 2;Past/Current Experience;Profession    Comorbidities three previous MPFL surgeries, anemia    Examination-Activity Limitations Bathing;Bed Mobility;Caring for Others;Carry;Stairs;Squat;Sleep;Sit;Locomotion Level;Lift;Stand;Transfers    Examination-Participation Restrictions Cleaning;Community Activity;Meal Prep;Laundry;Shop;Yard Work;Occupation    Stability/Clinical Decision Making Stable/Uncomplicated    Rehab Potential Good    PT Frequency 2x / week    PT Duration 6 weeks    PT Treatment/Interventions ADLs/Self Care Home Management;Aquatic Therapy;Cryotherapy;Electrical Stimulation;Biofeedback;Iontophoresis 4mg /ml Dexamethasone;Moist Heat;Ultrasound;Functional mobility training;Stair training;Gait training;DME Instruction;Therapeutic activities;Therapeutic exercise;Balance training;Neuromuscular re-education;Manual techniques;Patient/family education;Compression bandaging;Scar mobilization;Passive range of motion;Dry needling;Taping;Splinting;Energy conservation;Orthotic Fit/Training    PT Next Visit Plan ROM, strength    PT Home Exercise Plan see above    Consulted and Agree with Plan of Care Patient           Patient will benefit from skilled therapeutic intervention in order to improve the following deficits and impairments:  Abnormal gait, Decreased activity tolerance, Decreased endurance, Decreased mobility, Decreased range of motion, Difficulty walking, Decreased strength, Decreased scar mobility, Increased edema, Impaired flexibility, Impaired perceived functional ability, Improper body mechanics, Pain, Increased muscle spasms, Impaired sensation, Postural dysfunction  Visit Diagnosis: Stiffness of right knee, not elsewhere classified  Other abnormalities of gait and mobility  Muscle weakness (generalized)     Problem List Patient Active Problem  List   Diagnosis Date Noted   Vitamin B12 deficiency 12/18/2019   Plant irritant contact dermatitis 06/17/2019   Iron deficiency anemia 12/09/2018   Preventative health care 12/04/2016   Seasonal allergic rhinitis 11/27/2014   Janna Arch, PT, DPT   10/04/2020, 12:40 PM  [place]  [place], [place], [address] Phone: [phone]   Fax:  [phone]  Name: Dawn Hamilton MRN: [id number] Date of Birth: May 29, 1976

## 2020-10-05 ENCOUNTER — Ambulatory Visit: Payer: 59

## 2020-10-05 DIAGNOSIS — M6281 Muscle weakness (generalized): Secondary | ICD-10-CM | POA: Diagnosis not present

## 2020-10-05 DIAGNOSIS — R2689 Other abnormalities of gait and mobility: Secondary | ICD-10-CM | POA: Diagnosis not present

## 2020-10-05 DIAGNOSIS — M25561 Pain in right knee: Secondary | ICD-10-CM | POA: Diagnosis not present

## 2020-10-05 DIAGNOSIS — M25661 Stiffness of right knee, not elsewhere classified: Secondary | ICD-10-CM

## 2020-10-05 NOTE — Therapy (Signed)
[place] MAIN [facility] SERVICES [address] [place], [place], [address] Phone: [phone]   Fax:  [phone]  Physical Therapy Treatment  Patient Details  Name: Dawn Hamilton MRN: [id number] Date of Birth: 04/09/1976 Referring Provider (PT): Hiram Gash   Encounter Date: 10/05/2020   PT End of Session - 10/06/20 0837    Visit Number 3    Number of Visits 12    Date for PT Re-Evaluation 11/12/20    Authorization Type 3/10 eval 10/01/20    PT Start Time 1101    PT Stop Time 1144    PT Time Calculation (min) 43 min    Equipment Utilized During Treatment Right knee immobilizer    Activity Tolerance Patient tolerated treatment well    Behavior During Therapy [facility] for tasks assessed/performed           Past Medical History:  Diagnosis Date  . Allergy   . Anxiety   . Depression   . Precancerous skin lesion     Past Surgical History:  Procedure Laterality Date  . KNEE ARTHROSCOPY WITH MEDIAL COLLATERAL LIGAMENT RECONSTRUCTION Right 09/23/2020   Procedure: RIGHT KNEE ARTHROSCOPY DEBRIDMENT  WITH MEDIAL COLLATERAL LIGAMENT RECONSTRUCTION;  Surgeon: Hiram Gash, MD;  Location: [place];  Service: Orthopedics;  Laterality: Right;  . KNEE ARTHROSCOPY WITH MEDIAL PATELLAR FEMORAL LIGAMENT RECONSTRUCTION Right 10/02/2019   Procedure: KNEE ARTHROSCOPY WITH MEDIAL PATELLAR FEMORAL LIGAMENT RECONSTRUCTION WITH ALLOGRAPH;  Surgeon: Hiram Gash, MD;  Location: [place];  Service: Orthopedics;  Laterality: Right;  . KNEE SURGERY     x3    There were no vitals filed for this visit.   Subjective Assessment - 10/06/20 0821    Subjective Patient is leaving for the holidays, is aware she needs to get out of car every hour to hour and a half.  Reports she had her pain medication about an hour prior. Has been compliant with wearing her compression garments.    Pertinent History Patient is a 44 year old female  s/p Right MCL reconstruction with tibialis anterior allograft on 09/23/20. Patient had an additional MPFL on 09/2019 and has seen this therapist for therapy afterwards; as well as additional MPFL surgery in 1996 and 2004. Last march she was having some pain and an MRI of the R knee showed positive MCl sprain but with no full thickness MCL injury. She was treated conservatively with J&J brace. She did not have any improvements and had worsening of sharp pains. 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    Limitations Sitting;Lifting;Standing;Walking;House hold activities    How long can you sit comfortably? requires J & J brace, uncomfortable for prolonged sitting    How long can you stand comfortably? WBAT with J &J brace    How long can you walk comfortably? today is first day WBAT with J& J brace    Patient Stated Goals to return to PLOF.    Currently in Pain? Yes    Pain Score 2     Pain Location Knee    Pain Orientation Right    Pain Descriptors / Indicators Aching    Pain Onset In the past 7 days  Manual: Supine:  Increasing R knee flexion with overpressure at foot and under knee 10x 5 second holds R hamstring lengthening 60 seconds, second set with calf overpressure 60 seconds Patella mobilizations grade I, 4x 10 seconds each direction,  R calf roller 4 minutes for muscle tissue reduction   sidelying: R hip posterior and inferior mobilization 5x 15 second hold for improved bony alignment and reduction of tension on R knee  Prone: R knee flexion with progressive overpressure 8x 20 seconds holds  TherEx Supine:  Contract relax RLE against PT shoulder for optimizing knee extension and flexion 10x 5 second  holds Hamstring curl with leg on green swiss ball with min A for neutral alignment for reduction of internal rotation 15x with increasing knee flexion with repetition  SAQ over blue bolster 12x with focus on quad contraction rather than compensatory glute contraction  seated STS from raised plinth table with weight shift onto RLE cues to press into PT hand for overpressure  Prone: R hamstring curl modified 60-90 degree holds 15x      Pt educated throughout session about proper posture and technique with exercises. Improved exercise technique, movement at target joints, use of target muscles after min to mod verbal, visual, tactile cues  education on mobility for car trip; alternating LAQ, toe heel raises, march, standing hip flexor stretch, lap around car. Patient verbalized understanding.                        PT Education - 10/06/20 0837    Education Details exercise technique, body mechanics, manual    Person(s) Educated Patient    Methods Explanation;Demonstration;Tactile cues;Verbal cues    Comprehension Verbalized understanding;Returned demonstration;Verbal cues required;Tactile cues required            PT Short Term Goals - 10/01/20 1214      PT SHORT TERM GOAL #1   Title Patient will be independent in home exercise program to improve strength/mobility for better functional independence with ADLs.    Baseline 11/19: HEP given    Time 2    Period Weeks    Status New    Target Date 10/15/20      PT SHORT TERM GOAL #2   Title Patient will demonstrate 90 degrees of flexion and neutral knee extension with active range of motion of R knee for improved gait mechanics and muscular activation/alignment    Baseline 11/19: 57 flexion -1 extension    Time 2    Period Weeks    Status New    Target Date 10/15/20             PT Long Term Goals - 10/01/20 1216      PT LONG TERM GOAL #1   Title Patient will demonstrate >/=120 degrees of flexion and  neutral extension with active range of motion of R knee  for improved gait mechanics and muscular activation/alignment and return to functional ROM    Baseline 11/19: 57 flexion, -1 extension    Time 6    Period Weeks    Status New    Target Date 11/12/20      PT LONG TERM GOAL #2   Title Patient will increase BLE gross strength to 4+/5 as to improve functional strength for independent gait, increased standing tolerance and increased ADL ability.    Baseline 11/19: see note    Time 6    Period Weeks    Status New    Target Date  11/12/20      PT LONG TERM GOAL #3   Title Patient will increase lower extremity functional scale to >60/80 to demonstrate improved functional mobility and increased tolerance with ADLs.    Baseline 11/19: 32/808    Time 6    Period Weeks    Status New    Target Date 11/12/20      PT LONG TERM GOAL #4   Title Patient will increase FOTO score to equal to or greater than   67%  to demonstrate statistically significant improvement in mobility and quality of life.    Baseline 11/19: 43%    Time 6    Period Weeks    Status New    Target Date 11/12/20      PT LONG TERM GOAL #5   Title Patient will perform a squat with equal weight shift and no LOB or increase of pain >1 degree on the VAS scale for functional activty performance and return to PLOF.    Baseline 11/19: unable    Time 6    Period Weeks    Status New    Target Date 11/12/20      PT LONG TERM GOAL #6   Title Patient will perform the 6 minute walk test for >1000 ft with no episodes of hip hiking, vaulting, or antalgia for return to PLOF to be within functional norms for age.    Baseline 11/19: unable to perform    Time 6    Period Weeks    Status New    Target Date 11/12/20                 Plan - 10/06/20 7517    Clinical Impression Statement Patient is able to tolerate progressive ROM actively and passively. She is challenged with right LE alignment with frequent preference for  resting position in internal rotation. She continues to be highly motivated and verbalized understanding for need for mobility during car trip. Patient will benefit from skilled physical therapy to increase strength, ROM, and stabilization to RLE for return to PLOF.    Personal Factors and Comorbidities Comorbidity 2;Past/Current Experience;Profession    Comorbidities three previous MPFL surgeries, anemia    Examination-Activity Limitations Bathing;Bed Mobility;Caring for Others;Carry;Stairs;Squat;Sleep;Sit;Locomotion Level;Lift;Stand;Transfers    Examination-Participation Restrictions Cleaning;Community Activity;Meal Prep;Laundry;Shop;Yard Work;Occupation    Stability/Clinical Decision Making Stable/Uncomplicated    Rehab Potential Good    PT Frequency 2x / week    PT Duration 6 weeks    PT Treatment/Interventions ADLs/Self Care Home Management;Aquatic Therapy;Cryotherapy;Electrical Stimulation;Biofeedback;Iontophoresis 4mg /ml Dexamethasone;Moist Heat;Ultrasound;Functional mobility training;Stair training;Gait training;DME Instruction;Therapeutic activities;Therapeutic exercise;Balance training;Neuromuscular re-education;Manual techniques;Patient/family education;Compression bandaging;Scar mobilization;Passive range of motion;Dry needling;Taping;Splinting;Energy conservation;Orthotic Fit/Training    PT Next Visit Plan ROM, strength    PT Home Exercise Plan see above    Consulted and Agree with Plan of Care Patient           Patient will benefit from skilled therapeutic intervention in order to improve the following deficits and impairments:  Abnormal gait, Decreased activity tolerance, Decreased endurance, Decreased mobility, Decreased range of motion, Difficulty walking, Decreased strength, Decreased scar mobility, Increased edema, Impaired flexibility, Impaired perceived functional ability, Improper body mechanics, Pain, Increased muscle spasms, Impaired sensation, Postural dysfunction  Visit  Diagnosis: Stiffness of right knee, not elsewhere classified  Other abnormalities of gait and mobility  Muscle weakness (generalized)     Problem List Patient Active Problem List   Diagnosis Date Noted  . Vitamin B12 deficiency 12/18/2019  . Plant irritant contact dermatitis  06/17/2019  . Iron deficiency anemia 12/09/2018  . Preventative health care 12/04/2016  . Seasonal allergic rhinitis 11/27/2014   Janna Arch, PT, DPT    10/06/2020, 8:40 AM  [place] MAIN [facility] SERVICES [address] [place], [place], [address] Phone: [phone]   Fax:  [phone]  Name: Dawn Hamilton MRN: [id number] Date of Birth: 1975-12-07

## 2020-10-12 ENCOUNTER — Ambulatory Visit: Payer: 59

## 2020-10-12 ENCOUNTER — Other Ambulatory Visit: Payer: Self-pay

## 2020-10-12 DIAGNOSIS — R2689 Other abnormalities of gait and mobility: Secondary | ICD-10-CM

## 2020-10-12 DIAGNOSIS — M25561 Pain in right knee: Secondary | ICD-10-CM | POA: Diagnosis not present

## 2020-10-12 DIAGNOSIS — M25661 Stiffness of right knee, not elsewhere classified: Secondary | ICD-10-CM | POA: Diagnosis not present

## 2020-10-12 DIAGNOSIS — M6281 Muscle weakness (generalized): Secondary | ICD-10-CM | POA: Diagnosis not present

## 2020-10-12 NOTE — Therapy (Signed)
[place] MAIN [facility] SERVICES [address] [place], [place], [address] Phone: [phone]   Fax:  [phone]  Physical Therapy Treatment  Patient Details  Name: Dawn Hamilton MRN: [id number] Date of Birth: 1976/05/26 Referring Provider (PT): Hiram Gash   Encounter Date: 10/12/2020   PT End of Session - 10/12/20 1727    Visit Number 4    Number of Visits 12    Date for PT Re-Evaluation 11/12/20    Authorization Type 4/10 eval 10/01/20    PT Start Time 0845    PT Stop Time 0929    PT Time Calculation (min) 44 min    Equipment Utilized During Treatment Right knee immobilizer    Activity Tolerance Patient tolerated treatment well    Behavior During Therapy [facility] for tasks assessed/performed           Past Medical History:  Diagnosis Date   Allergy    Anxiety    Depression    Precancerous skin lesion     Past Surgical History:  Procedure Laterality Date   KNEE ARTHROSCOPY WITH MEDIAL COLLATERAL LIGAMENT RECONSTRUCTION Right 09/23/2020   Procedure: RIGHT KNEE ARTHROSCOPY DEBRIDMENT  WITH MEDIAL COLLATERAL LIGAMENT RECONSTRUCTION;  Surgeon: Hiram Gash, MD;  Location: [place];  Service: Orthopedics;  Laterality: Right;   KNEE ARTHROSCOPY WITH MEDIAL PATELLAR FEMORAL LIGAMENT RECONSTRUCTION Right 10/02/2019   Procedure: KNEE ARTHROSCOPY WITH MEDIAL PATELLAR FEMORAL LIGAMENT RECONSTRUCTION WITH ALLOGRAPH;  Surgeon: Hiram Gash, MD;  Location: [place];  Service: Orthopedics;  Laterality: Right;   KNEE SURGERY     x3    There were no vitals filed for this visit.   Subjective Assessment - 10/12/20 1724    Subjective Patient had a good vacation. Has been compliant with HEP and stretching.    Pertinent History Patient is a 44 year old female s/p Right MCL reconstruction with tibialis anterior allograft on 09/23/20. Patient had an additional MPFL on 09/2019 and has seen this therapist  for therapy afterwards; as well as additional MPFL surgery in 1996 and 2004. Last march she was having some pain and an MRI of the R knee showed positive MCl sprain but with no full thickness MCL injury. She was treated conservatively with J&J brace. She did not have any improvements and had worsening of sharp pains. 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    Limitations Sitting;Lifting;Standing;Walking;House hold activities    How long can you sit comfortably? requires J & J brace, uncomfortable for prolonged sitting    How long can you stand comfortably? WBAT with J &J brace    How long can you walk comfortably? today is first day WBAT with J& J brace    Patient Stated Goals to return to PLOF.    Currently in Pain? Yes    Pain Score 2     Pain Location Knee    Pain Orientation Right    Pain Descriptors / Indicators Aching    Pain Type Surgical pain    Pain Onset In the past 7 days    Pain Frequency Constant           Stationary bike seat level 15: forward, backwards for increasing arc of motion with focus  on heel press rather than pushing through toes, control of eccentric and concentric portion of range   Manual: Supine:  Increasing R knee flexion with overpressure at foot and under knee 10x 5 second holds R hamstring lengthening 60 seconds, second set with calf overpressure 60 seconds Patella mobilizations grade I, 4x 10 seconds each direction,    Prone: R knee flexion with progressive overpressure 8x 20 seconds holds R knee flexion with towel under distal aspect 3x30 second holds.   TherEx Supine:   Contract relax RLE against PT shoulder for optimizing knee extension and flexion 10x 10 second holds  seated hamstring curl  AROM into LAQ with focus on slow controlled eccentric and concentric activation. 10x    Prone: R hamstring curl modified 40-90 degree holds 15x          Pt educated throughout session about proper posture and technique with exercises. Improved exercise technique, movement at target joints, use of target muscles after min to mod verbal, visual, tactile cues                         PT Education - 10/12/20 1725    Education Details exercise technique, body mechanics    Person(s) Educated Patient    Methods Explanation;Demonstration;Tactile cues;Verbal cues    Comprehension Verbalized understanding;Verbal cues required;Tactile cues required;Returned demonstration            PT Short Term Goals - 10/01/20 1214      PT SHORT TERM GOAL #1   Title Patient will be independent in home exercise program to improve strength/mobility for better functional independence with ADLs.    Baseline 11/19: HEP given    Time 2    Period Weeks    Status New    Target Date 10/15/20      PT SHORT TERM GOAL #2   Title Patient will demonstrate 90 degrees of flexion and neutral knee extension with active range of motion of R knee for improved gait mechanics and muscular activation/alignment    Baseline 11/19: 57 flexion -1 extension    Time 2    Period Weeks    Status New    Target Date 10/15/20             PT Long Term Goals - 10/01/20 1216      PT LONG TERM GOAL #1   Title Patient will demonstrate >/=120 degrees of flexion and neutral extension with active range of motion of R knee  for improved gait mechanics and muscular activation/alignment and return to functional ROM    Baseline 11/19: 57 flexion, -1 extension    Time 6    Period Weeks    Status New    Target Date 11/12/20      PT LONG TERM GOAL #2   Title Patient will increase BLE gross strength to 4+/5 as to improve functional strength for independent gait, increased standing tolerance and increased ADL ability.     Baseline 11/19: see note    Time 6    Period Weeks    Status New    Target Date 11/12/20      PT LONG TERM GOAL #3   Title Patient will increase lower extremity functional scale to >60/80 to demonstrate improved functional mobility and increased tolerance with ADLs.    Baseline 11/19: 32/808    Time 6    Period Weeks    Status New    Target Date 11/12/20  PT LONG TERM GOAL #4   Title Patient will increase FOTO score to equal to or greater than   67%  to demonstrate statistically significant improvement in mobility and quality of life.    Baseline 11/19: 43%    Time 6    Period Weeks    Status New    Target Date 11/12/20      PT LONG TERM GOAL #5   Title Patient will perform a squat with equal weight shift and no LOB or increase of pain >1 degree on the VAS scale for functional activty performance and return to PLOF.    Baseline 11/19: unable    Time 6    Period Weeks    Status New    Target Date 11/12/20      PT LONG TERM GOAL #6   Title Patient will perform the 6 minute walk test for >1000 ft with no episodes of hip hiking, vaulting, or antalgia for return to PLOF to be within functional norms for age.    Baseline 11/19: unable to perform    Time 6    Period Weeks    Status New    Target Date 11/12/20                 Plan - 10/12/20 1728    Clinical Impression Statement Patient is progressing with functional active and passive ROM with knee flexion passively 103 at this time. Improved quad and hamstring contraction requires decreased need for external cueing with decreased internal rotation noted. Patient challenged with prolonged recruitment at this time with fatigue noted with repeated motions. Patient will benefit from skilled physical therapy to increase strength, ROM, and stabilization to RLE for return to PLOF    Personal Factors and Comorbidities Comorbidity 2;Past/Current Experience;Profession    Comorbidities three previous MPFL surgeries, anemia     Examination-Activity Limitations Bathing;Bed Mobility;Caring for Others;Carry;Stairs;Squat;Sleep;Sit;Locomotion Level;Lift;Stand;Transfers    Examination-Participation Restrictions Cleaning;Community Activity;Meal Prep;Laundry;Shop;Yard Work;Occupation    Stability/Clinical Decision Making Stable/Uncomplicated    Rehab Potential Good    PT Frequency 2x / week    PT Duration 6 weeks    PT Treatment/Interventions ADLs/Self Care Home Management;Aquatic Therapy;Cryotherapy;Electrical Stimulation;Biofeedback;Iontophoresis 4mg /ml Dexamethasone;Moist Heat;Ultrasound;Functional mobility training;Stair training;Gait training;DME Instruction;Therapeutic activities;Therapeutic exercise;Balance training;Neuromuscular re-education;Manual techniques;Patient/family education;Compression bandaging;Scar mobilization;Passive range of motion;Dry needling;Taping;Splinting;Energy conservation;Orthotic Fit/Training    PT Next Visit Plan ROM, strength    PT Home Exercise Plan see above    Consulted and Agree with Plan of Care Patient           Patient will benefit from skilled therapeutic intervention in order to improve the following deficits and impairments:  Abnormal gait, Decreased activity tolerance, Decreased endurance, Decreased mobility, Decreased range of motion, Difficulty walking, Decreased strength, Decreased scar mobility, Increased edema, Impaired flexibility, Impaired perceived functional ability, Improper body mechanics, Pain, Increased muscle spasms, Impaired sensation, Postural dysfunction  Visit Diagnosis: Stiffness of right knee, not elsewhere classified  Other abnormalities of gait and mobility  Muscle weakness (generalized)     Problem List Patient Active Problem List   Diagnosis Date Noted   Vitamin B12 deficiency 12/18/2019   Plant irritant contact dermatitis 06/17/2019   Iron deficiency anemia 12/09/2018   Preventative health care 12/04/2016   Seasonal allergic rhinitis  11/27/2014   Janna Arch, PT, DPT   10/12/2020, 5:29 PM  [place] MAIN [facility] SERVICES [address] [place], [place], [address] Phone: [phone]   Fax:  [phone]  Name: Dawn Hamilton MRN:  [id number] Date of Birth: Dec 13, 1975

## 2020-10-15 ENCOUNTER — Other Ambulatory Visit: Payer: Self-pay

## 2020-10-15 ENCOUNTER — Ambulatory Visit: Payer: 59 | Attending: Orthopaedic Surgery

## 2020-10-15 DIAGNOSIS — M25661 Stiffness of right knee, not elsewhere classified: Secondary | ICD-10-CM | POA: Diagnosis not present

## 2020-10-15 DIAGNOSIS — R2689 Other abnormalities of gait and mobility: Secondary | ICD-10-CM | POA: Insufficient documentation

## 2020-10-15 DIAGNOSIS — M6281 Muscle weakness (generalized): Secondary | ICD-10-CM | POA: Diagnosis not present

## 2020-10-15 NOTE — Therapy (Signed)
[place] MAIN [facility] SERVICES [address] [place], [place], [address] Phone: [phone]   Fax:  [phone]  Physical Therapy Treatment  Patient Details  Name: Dawn Hamilton MRN: [id number] Date of Birth: [birth year] Referring Provider (PT): Hiram Gash   Encounter Date: 10/15/2020   PT End of Session - 10/15/20 0946    Visit Number 5    Number of Visits 12    Date for PT Re-Evaluation 11/12/20    Authorization Type 5/10 eval 10/01/20    PT Start Time 0800    PT Stop Time 0846    PT Time Calculation (min) 46 min    Equipment Utilized During Treatment Right knee immobilizer    Activity Tolerance Patient tolerated treatment well    Behavior During Therapy [facility] for tasks assessed/performed           Past Medical History:  Diagnosis Date  . Allergy   . Anxiety   . Depression   . Precancerous skin lesion     Past Surgical History:  Procedure Laterality Date  . KNEE ARTHROSCOPY WITH MEDIAL COLLATERAL LIGAMENT RECONSTRUCTION Right 09/23/2020   Procedure: RIGHT KNEE ARTHROSCOPY DEBRIDMENT  WITH MEDIAL COLLATERAL LIGAMENT RECONSTRUCTION;  Surgeon: Hiram Gash, MD;  Location: [place];  Service: Orthopedics;  Laterality: Right;  . KNEE ARTHROSCOPY WITH MEDIAL PATELLAR FEMORAL LIGAMENT RECONSTRUCTION Right 10/02/2019   Procedure: KNEE ARTHROSCOPY WITH MEDIAL PATELLAR FEMORAL LIGAMENT RECONSTRUCTION WITH ALLOGRAPH;  Surgeon: Hiram Gash, MD;  Location: [place];  Service: Orthopedics;  Laterality: Right;  . KNEE SURGERY     x3    There were no vitals filed for this visit.   Subjective Assessment - 10/15/20 0945    Subjective Patient reports compliance with HEP. Has been practicing stairs.    Pertinent History Patient is a 44 year old female s/p Right MCL reconstruction with tibialis anterior allograft on 09/23/20. Patient had an additional MPFL on 09/2019 and has seen this therapist for therapy  afterwards; as well as additional MPFL surgery in 1996 and 2004. Last march she was having some pain and an MRI of the R knee showed positive MCl sprain but with no full thickness MCL injury. She was treated conservatively with J&J brace. She did not have any improvements and had worsening of sharp pains. 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    Limitations Sitting;Lifting;Standing;Walking;House hold activities    How long can you sit comfortably? requires J & J brace, uncomfortable for prolonged sitting    How long can you stand comfortably? WBAT with J &J brace    How long can you walk comfortably? today is first day WBAT with J& J brace    Patient Stated Goals to return to PLOF.    Currently in Pain? Yes    Pain Score 2     Pain Location Knee    Pain Orientation Right    Pain Descriptors / Indicators Aching    Pain Type Surgical pain    Pain Onset In the past 7 days    Pain Frequency Constant                Manual: Supine:  Increasing R knee flexion with overpressure at foot and  under knee 10x 5 second holds R hamstring lengthening 60 seconds, second set with calf overpressure 60 seconds Patella mobilizations grade I, 4x 10 seconds each direction, scar tissue massage x 4 minutes  Prone: R knee flexion with progressive overpressure8x 20 seconds holds R knee flexion with towel under distal aspect 3x30 second holds.  PROM supine: 106 degrees flexion   TherEx  Supine:  Contract relax RLE against PT shoulder for optimizing knee extension and flexion 10x 10 second holds   Prone: R hamstring curl modified 40-90 degree holds 15x  standing: (with brace on)  Hip abduction 10x 2 sets with chair support single leg  progression:  -LLE on bolster weight shift onto RLE with focus on neutral alignment of LE and reduction of internal rotation x60 seconds -LLE on yellow dynadisc weight shift onto RLE with focus on neutral alignment of LE and reduction of internal rotation x60 seconds -LLE Single limb modified with RLE toe touch behind, bilateral glute squeeze 10x   Pt educated throughout session about proper posture and technique with exercises. Improved exercise technique, movement at target joints, use of target muscles after min to mod verbal, visual, tactile cues                  Pt educated throughout session about proper posture and technique with exercises. Improved exercise technique, movement at target joints, use of target muscles after min to mod verbal, visual, tactile cues.         PT Education - 10/15/20 0946    Education Details exercise technique, body mechanics, manual    Person(s) Educated Patient    Methods Explanation;Demonstration;Tactile cues;Verbal cues    Comprehension Verbalized understanding;Returned demonstration;Verbal cues required;Tactile cues required            PT Short Term Goals - 10/01/20 1214      PT SHORT TERM GOAL #1   Title Patient will be independent in home exercise program to improve strength/mobility for better functional independence with ADLs.    Baseline 11/19: HEP given    Time 2    Period Weeks    Status New    Target Date 10/15/20      PT SHORT TERM GOAL #2   Title Patient will demonstrate 90 degrees of flexion and neutral knee extension with active range of motion of R knee for improved gait mechanics and muscular activation/alignment    Baseline 11/19: 57 flexion -1 extension    Time 2    Period Weeks    Status New    Target Date 10/15/20             PT Long Term Goals - 10/01/20 1216      PT LONG TERM GOAL #1   Title Patient will demonstrate >/=120 degrees of flexion and neutral extension with active range of  motion of R knee  for improved gait mechanics and muscular activation/alignment and return to functional ROM    Baseline 11/19: 57 flexion, -1 extension    Time 6    Period Weeks    Status New    Target Date 11/12/20      PT LONG TERM GOAL #2   Title Patient will increase BLE gross strength to 4+/5 as to improve functional strength for independent gait, increased standing tolerance and increased ADL ability.    Baseline 11/19: see note    Time 6    Period Weeks    Status New    Target Date 11/12/20  PT LONG TERM GOAL #3   Title Patient will increase lower extremity functional scale to >60/80 to demonstrate improved functional mobility and increased tolerance with ADLs.    Baseline 11/19: 32/808    Time 6    Period Weeks    Status New    Target Date 11/12/20      PT LONG TERM GOAL #4   Title Patient will increase FOTO score to equal to or greater than   67%  to demonstrate statistically significant improvement in mobility and quality of life.    Baseline 11/19: 43%    Time 6    Period Weeks    Status New    Target Date 11/12/20      PT LONG TERM GOAL #5   Title Patient will perform a squat with equal weight shift and no LOB or increase of pain >1 degree on the VAS scale for functional activty performance and return to PLOF.    Baseline 11/19: unable    Time 6    Period Weeks    Status New    Target Date 11/12/20      PT LONG TERM GOAL #6   Title Patient will perform the 6 minute walk test for >1000 ft with no episodes of hip hiking, vaulting, or antalgia for return to PLOF to be within functional norms for age.    Baseline 11/19: unable to perform    Time 6    Period Weeks    Status New    Target Date 11/12/20                 Plan - 10/15/20 8657    Clinical Impression Statement Patient is progressing with functional range of motion increasing passive knee flexion to 106 after extensive prolonged muscle lengthening techniques. Initially patient is limited  with increased muscle guarding but reduces to a more functional range as session progressed. Single leg progression performed with patient tolerating well, reduction of internal rotation required frequent cueing to obtain neutral alignment with patient reporting challenge of finding neutral position internally. Patient will benefit from skilled physical therapy to increase strength, ROM, and stabilization to RLE for return to PLOF    Personal Factors and Comorbidities Comorbidity 2;Past/Current Experience;Profession    Comorbidities three previous MPFL surgeries, anemia    Examination-Activity Limitations Bathing;Bed Mobility;Caring for Others;Carry;Stairs;Squat;Sleep;Sit;Locomotion Level;Lift;Stand;Transfers    Examination-Participation Restrictions Cleaning;Community Activity;Meal Prep;Laundry;Shop;Yard Work;Occupation    Stability/Clinical Decision Making Stable/Uncomplicated    Rehab Potential Good    PT Frequency 2x / week    PT Duration 6 weeks    PT Treatment/Interventions ADLs/Self Care Home Management;Aquatic Therapy;Cryotherapy;Electrical Stimulation;Biofeedback;Iontophoresis 4mg /ml Dexamethasone;Moist Heat;Ultrasound;Functional mobility training;Stair training;Gait training;DME Instruction;Therapeutic activities;Therapeutic exercise;Balance training;Neuromuscular re-education;Manual techniques;Patient/family education;Compression bandaging;Scar mobilization;Passive range of motion;Dry needling;Taping;Splinting;Energy conservation;Orthotic Fit/Training    PT Next Visit Plan ROM, strength    PT Home Exercise Plan see above    Consulted and Agree with Plan of Care Patient           Patient will benefit from skilled therapeutic intervention in order to improve the following deficits and impairments:  Abnormal gait, Decreased activity tolerance, Decreased endurance, Decreased mobility, Decreased range of motion, Difficulty walking, Decreased strength, Decreased scar mobility, Increased edema,  Impaired flexibility, Impaired perceived functional ability, Improper body mechanics, Pain, Increased muscle spasms, Impaired sensation, Postural dysfunction  Visit Diagnosis: Stiffness of right knee, not elsewhere classified  Other abnormalities of gait and mobility  Muscle weakness (generalized)     Problem List Patient Active Problem  List   Diagnosis Date Noted  . Vitamin B12 deficiency 12/18/2019  . Plant irritant contact dermatitis 06/17/2019  . Iron deficiency anemia 12/09/2018  . Preventative health care 12/04/2016  . Seasonal allergic rhinitis 11/27/2014   Janna Arch, PT, DPT   10/15/2020, 10:03 AM  [place] MAIN [facility] SERVICES [address] [place], [place], [address] Phone: [phone]   Fax:  [phone]  Name: Dawn Hamilton MRN: [id number] Date of Birth: 08-20-1976

## 2020-10-18 ENCOUNTER — Other Ambulatory Visit: Payer: Self-pay

## 2020-10-18 ENCOUNTER — Ambulatory Visit: Payer: 59

## 2020-10-18 DIAGNOSIS — M6281 Muscle weakness (generalized): Secondary | ICD-10-CM | POA: Diagnosis not present

## 2020-10-18 DIAGNOSIS — M25661 Stiffness of right knee, not elsewhere classified: Secondary | ICD-10-CM

## 2020-10-18 DIAGNOSIS — R2689 Other abnormalities of gait and mobility: Secondary | ICD-10-CM | POA: Diagnosis not present

## 2020-10-18 NOTE — Therapy (Signed)
[place] MAIN [facility] SERVICES [address] [place], [place], [address] Phone: [phone]   Fax:  [phone]  Physical Therapy Treatment  Patient Details  Name: Dawn Hamilton MRN: [id number] Date of Birth: [birth year] Referring Provider (PT): Hiram Gash   Encounter Date: 10/18/2020   PT End of Session - 10/18/20 0920    Visit Number 6    Number of Visits 12    Date for PT Re-Evaluation 11/12/20    Authorization Type 6/10 eval 10/01/20    PT Start Time 0759    PT Stop Time 0844    PT Time Calculation (min) 45 min    Equipment Utilized During Treatment Right knee immobilizer    Activity Tolerance Patient tolerated treatment well    Behavior During Therapy [facility] for tasks assessed/performed           Past Medical History:  Diagnosis Date  . Allergy   . Anxiety   . Depression   . Precancerous skin lesion     Past Surgical History:  Procedure Laterality Date  . KNEE ARTHROSCOPY WITH MEDIAL COLLATERAL LIGAMENT RECONSTRUCTION Right 09/23/2020   Procedure: RIGHT KNEE ARTHROSCOPY DEBRIDMENT  WITH MEDIAL COLLATERAL LIGAMENT RECONSTRUCTION;  Surgeon: Hiram Gash, MD;  Location: [place];  Service: Orthopedics;  Laterality: Right;  . KNEE ARTHROSCOPY WITH MEDIAL PATELLAR FEMORAL LIGAMENT RECONSTRUCTION Right 10/02/2019   Procedure: KNEE ARTHROSCOPY WITH MEDIAL PATELLAR FEMORAL LIGAMENT RECONSTRUCTION WITH ALLOGRAPH;  Surgeon: Hiram Gash, MD;  Location: [place];  Service: Orthopedics;  Laterality: Right;  . KNEE SURGERY     x3    There were no vitals filed for this visit.   Subjective Assessment - 10/18/20 0855    Subjective Patient reports compliance with stretching and strengthening.  Has been moving a lot over the weekend.    Pertinent History Patient is a 44 year old female s/p Right MCL reconstruction with tibialis anterior allograft on 09/23/20. Patient had an additional MPFL on 09/2019  and has seen this therapist for therapy afterwards; as well as additional MPFL surgery in 1996 and 2004. Last march she was having some pain and an MRI of the R knee showed positive MCl sprain but with no full thickness MCL injury. She was treated conservatively with J&J brace. She did not have any improvements and had worsening of sharp pains. 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    Limitations Sitting;Lifting;Standing;Walking;House hold activities    How long can you sit comfortably? requires J & J brace, uncomfortable for prolonged sitting    How long can you stand comfortably? WBAT with J &J brace    How long can you walk comfortably? today is first day WBAT with J& J brace    Patient Stated Goals to return to PLOF.    Currently in Pain? Yes    Pain Score 2     Pain Location Knee    Pain Orientation Right    Pain Descriptors / Indicators Aching    Pain Onset In the past 7 days                    Manual: Supine:  Increasing R knee flexion with overpressure at foot and under knee  10x 5 second holds R hamstring lengthening 60 seconds, second set with calf overpressure 60 seconds Patella mobilizations grade I, 4x 10 seconds each direction,   scar tissue massage x 4 minutes   Prone: R knee flexion with progressive overpressure 8x 20 seconds holds R knee flexion with towel under distal aspect 3x30 second holds.     TherEx  Stationary bike seat level 15: forward, backwards for increasing arc of motion with focus on heel press rather than pushing through toes, control of eccentric and concentric portion of range  Supine:   Contract relax RLE against PT shoulder for optimizing knee extension and flexion 10x 10 second  holds Over blue bolster: SAQ 10x 5 second holds with focus on quad contraction SLR with arc into abduction adduction 10x with focus on slow controlled contraction.      Prone: R hamstring curl modified 40-100 degree holds 15x   Pt educated throughout session about proper posture and technique with exercises. Improved exercise technique, movement at target joints, use of target muscles after min to mod verbal, visual, tactile cues          Pt educated throughout session about proper posture and technique with exercises. Improved exercise technique, movement at target joints, use of target muscles after min to mod verbal, visual, tactile cues.                        PT Education - 10/18/20 0919    Education Details quad contraciton improved flexion    Person(s) Educated Patient    Methods Explanation;Demonstration;Tactile cues;Verbal cues    Comprehension Verbalized understanding;Returned demonstration;Verbal cues required;Tactile cues required            PT Short Term Goals - 10/01/20 1214      PT SHORT TERM GOAL #1   Title Patient will be independent in home exercise program to improve strength/mobility for better functional independence with ADLs.    Baseline 11/19: HEP given    Time 2    Period Weeks    Status New    Target Date 10/15/20      PT SHORT TERM GOAL #2   Title Patient will demonstrate 90 degrees of flexion and neutral knee extension with active range of motion of R knee for improved gait mechanics and muscular activation/alignment    Baseline 11/19: 57 flexion -1 extension    Time 2    Period Weeks    Status New    Target Date 10/15/20             PT Long Term Goals - 10/01/20 1216      PT LONG TERM GOAL #1   Title Patient will demonstrate >/=120 degrees of flexion and neutral extension with active range of motion of R knee  for improved gait mechanics and muscular activation/alignment and return to functional ROM    Baseline 11/19:  57 flexion, -1 extension    Time 6    Period Weeks    Status New    Target Date 11/12/20      PT LONG TERM GOAL #2   Title Patient will increase BLE gross strength to 4+/5 as to improve functional strength for independent gait, increased standing tolerance and increased ADL ability.    Baseline 11/19: see note    Time 6    Period Weeks    Status New    Target Date 11/12/20      PT LONG TERM GOAL #3  Title Patient will increase lower extremity functional scale to >60/80 to demonstrate improved functional mobility and increased tolerance with ADLs.    Baseline 11/19: 32/808    Time 6    Period Weeks    Status New    Target Date 11/12/20      PT LONG TERM GOAL #4   Title Patient will increase FOTO score to equal to or greater than   67%  to demonstrate statistically significant improvement in mobility and quality of life.    Baseline 11/19: 43%    Time 6    Period Weeks    Status New    Target Date 11/12/20      PT LONG TERM GOAL #5   Title Patient will perform a squat with equal weight shift and no LOB or increase of pain >1 degree on the VAS scale for functional activty performance and return to PLOF.    Baseline 11/19: unable    Time 6    Period Weeks    Status New    Target Date 11/12/20      PT LONG TERM GOAL #6   Title Patient will perform the 6 minute walk test for >1000 ft with no episodes of hip hiking, vaulting, or antalgia for return to PLOF to be within functional norms for age.    Baseline 11/19: unable to perform    Time 6    Period Weeks    Status New    Target Date 11/12/20                 Plan - 10/18/20 5631    Clinical Impression Statement Patient presents with excellent motivation. She continues to progress with functional range of motion with increased focus on quad contraction this session. Patient is able to find midline with decreased episodes of regression to internal rotation noted. Patient will benefit from skilled physical therapy to  increase strength, ROM, and stabilization to RLE for return to PLOF    Personal Factors and Comorbidities Comorbidity 2;Past/Current Experience;Profession    Comorbidities three previous MPFL surgeries, anemia    Examination-Activity Limitations Bathing;Bed Mobility;Caring for Others;Carry;Stairs;Squat;Sleep;Sit;Locomotion Level;Lift;Stand;Transfers    Examination-Participation Restrictions Cleaning;Community Activity;Meal Prep;Laundry;Shop;Yard Work;Occupation    Stability/Clinical Decision Making Stable/Uncomplicated    Rehab Potential Good    PT Frequency 2x / week    PT Duration 6 weeks    PT Treatment/Interventions ADLs/Self Care Home Management;Aquatic Therapy;Cryotherapy;Electrical Stimulation;Biofeedback;Iontophoresis 4mg /ml Dexamethasone;Moist Heat;Ultrasound;Functional mobility training;Stair training;Gait training;DME Instruction;Therapeutic activities;Therapeutic exercise;Balance training;Neuromuscular re-education;Manual techniques;Patient/family education;Compression bandaging;Scar mobilization;Passive range of motion;Dry needling;Taping;Splinting;Energy conservation;Orthotic Fit/Training    PT Next Visit Plan ROM, strength    PT Home Exercise Plan see above    Consulted and Agree with Plan of Care Patient           Patient will benefit from skilled therapeutic intervention in order to improve the following deficits and impairments:  Abnormal gait, Decreased activity tolerance, Decreased endurance, Decreased mobility, Decreased range of motion, Difficulty walking, Decreased strength, Decreased scar mobility, Increased edema, Impaired flexibility, Impaired perceived functional ability, Improper body mechanics, Pain, Increased muscle spasms, Impaired sensation, Postural dysfunction  Visit Diagnosis: Stiffness of right knee, not elsewhere classified  Other abnormalities of gait and mobility  Muscle weakness (generalized)     Problem List Patient Active Problem List    Diagnosis Date Noted  . Vitamin B12 deficiency 12/18/2019  . Plant irritant contact dermatitis 06/17/2019  . Iron deficiency anemia 12/09/2018  . Preventative health care 12/04/2016  . Seasonal allergic rhinitis  11/27/2014   Janna Arch, PT, DPT   10/18/2020, 9:23 AM  [place] MAIN [facility] SERVICES [address] [place], [place], [address] Phone: [phone]   Fax:  [phone]  Name: Dawn Hamilton MRN: [id number] Date of Birth: Jun 11, 1976

## 2020-10-20 ENCOUNTER — Ambulatory Visit: Payer: 59

## 2020-10-20 DIAGNOSIS — M6281 Muscle weakness (generalized): Secondary | ICD-10-CM

## 2020-10-20 DIAGNOSIS — R2689 Other abnormalities of gait and mobility: Secondary | ICD-10-CM | POA: Diagnosis not present

## 2020-10-20 DIAGNOSIS — M25661 Stiffness of right knee, not elsewhere classified: Secondary | ICD-10-CM

## 2020-10-20 NOTE — Therapy (Signed)
[place] MAIN [facility] SERVICES [address] [place], [place], [address] Phone: [phone]   Fax:  [phone]  Physical Therapy Treatment  Patient Details  Name: Dawn Hamilton MRN: [id number] Date of Birth: [birth year] Referring Provider (PT): Hiram Gash   Encounter Date: 10/20/2020   PT End of Session - 10/20/20 1255    Visit Number 7    Number of Visits 12    Date for PT Re-Evaluation 11/12/20    Authorization Type 7/10 eval 10/01/20    PT Start Time 0748    PT Stop Time 0840    PT Time Calculation (min) 52 min    Equipment Utilized During Treatment Right knee immobilizer    Activity Tolerance Patient tolerated treatment well    Behavior During Therapy [facility] for tasks assessed/performed           Past Medical History:  Diagnosis Date  . Allergy   . Anxiety   . Depression   . Precancerous skin lesion     Past Surgical History:  Procedure Laterality Date  . KNEE ARTHROSCOPY WITH MEDIAL COLLATERAL LIGAMENT RECONSTRUCTION Right 09/23/2020   Procedure: RIGHT KNEE ARTHROSCOPY DEBRIDMENT  WITH MEDIAL COLLATERAL LIGAMENT RECONSTRUCTION;  Surgeon: Hiram Gash, MD;  Location: [place];  Service: Orthopedics;  Laterality: Right;  . KNEE ARTHROSCOPY WITH MEDIAL PATELLAR FEMORAL LIGAMENT RECONSTRUCTION Right 10/02/2019   Procedure: KNEE ARTHROSCOPY WITH MEDIAL PATELLAR FEMORAL LIGAMENT RECONSTRUCTION WITH ALLOGRAPH;  Surgeon: Hiram Gash, MD;  Location: [place];  Service: Orthopedics;  Laterality: Right;  . KNEE SURGERY     x3    There were no vitals filed for this visit.   Subjective Assessment - 10/20/20 1253    Subjective Patient moved appt with surgeon to friday due to conflict with work and dentist appointment. Has been compliant with HEP.    Pertinent History Patient is a 44 year old female s/p Right MCL reconstruction with tibialis anterior allograft on 09/23/20. 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 Last march she was having some pain and an MRI of the R knee showed positive MCl sprain but with no full thickness MCL injury. She was treated conservatively with J&J brace. She did not have any improvements and had worsening of sharp pains. 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    Limitations Sitting;Lifting;Standing;Walking;House hold activities    How long can you sit comfortably? requires J & J brace, uncomfortable for prolonged sitting    How long can you stand comfortably? WBAT with J &J brace    How long can you walk comfortably? today is first day WBAT with J& J brace    Patient Stated Goals to return to PLOF.    Currently in Pain? Yes    Pain Score 1     Pain Location Knee    Pain Orientation Right    Pain Descriptors / Indicators Aching    Pain Type Surgical pain    Pain Onset In the past 7 days    Pain Frequency Constant  Manual: Supine:  Increasing R knee flexion with overpressure at foot and under knee 10x 5 second holds R hamstring lengthening 60 seconds, second set with calf overpressure 60 seconds Patella mobilizations grade I, 4x 10 seconds each direction, scar tissue massage x 4 minutes  PROM supine: 111 degrees  Prone: R knee flexion with progressive overpressure8x 20 seconds holds R knee flexion with towel under distal aspect 3x30 second holds.   TherEx  Stationary bikeseat level 15: forward, backwards for increasing arc of motion with focus on heel press rather than pushing through toes, control of eccentric and concentric portion of  range  Supine: Contract relax RLE against PT shoulder for optimizing knee extension and flexion 10x 10 second holds  Prone: R hamstring curl modified 40-100 degree holds 15x R hip extension/glute activation 10x 5 second holds  Seated: Contract relax against PT resistance 3x 10 with 4 second holds each position with increasing range SLR/arc over cone for contraction of quad with dorsiflexion 10x each side.   Pt educated throughout session about proper posture and technique with exercises. Improved exercise technique, movement at target joints, use of target muscles after min to mod verbal, visual, tactile cues                         PT Education - 10/20/20 1254    Education Details progressive ROM, quad contraction.    Person(s) Educated Patient    Methods Explanation;Demonstration;Tactile cues;Verbal cues    Comprehension Verbalized understanding;Returned demonstration;Verbal cues required;Tactile cues required            PT Short Term Goals - 10/01/20 1214      PT SHORT TERM GOAL #1   Title Patient will be independent in home exercise program to improve strength/mobility for better functional independence with ADLs.    Baseline 11/19: HEP given    Time 2    Period Weeks    Status New    Target Date 10/15/20      PT SHORT TERM GOAL #2   Title Patient will demonstrate 90 degrees of flexion and neutral knee extension with active range of motion of R knee for improved gait mechanics and muscular activation/alignment    Baseline 11/19: 57 flexion -1 extension    Time 2    Period Weeks    Status New    Target Date 10/15/20             PT Long Term Goals - 10/01/20 1216      PT LONG TERM GOAL #1   Title Patient will demonstrate >/=120 degrees of flexion and neutral extension with active range of motion of R knee  for improved gait mechanics and muscular activation/alignment and return to functional ROM    Baseline 11/19: 57 flexion, -1  extension    Time 6    Period Weeks    Status New    Target Date 11/12/20      PT LONG TERM GOAL #2   Title Patient will increase BLE gross strength to 4+/5 as to improve functional strength for independent gait, increased standing tolerance and increased ADL ability.    Baseline 11/19: see note    Time 6    Period Weeks    Status New    Target Date 11/12/20      PT LONG TERM GOAL #3   Title Patient will increase lower extremity functional scale to >60/80 to demonstrate improved functional mobility and increased tolerance with  ADLs.    Baseline 11/19: 32/808    Time 6    Period Weeks    Status New    Target Date 11/12/20      PT LONG TERM GOAL #4   Title Patient will increase FOTO score to equal to or greater than   67%  to demonstrate statistically significant improvement in mobility and quality of life.    Baseline 11/19: 43%    Time 6    Period Weeks    Status New    Target Date 11/12/20      PT LONG TERM GOAL #5   Title Patient will perform a squat with equal weight shift and no LOB or increase of pain >1 degree on the VAS scale for functional activty performance and return to PLOF.    Baseline 11/19: unable    Time 6    Period Weeks    Status New    Target Date 11/12/20      PT LONG TERM GOAL #6   Title Patient will perform the 6 minute walk test for >1000 ft with no episodes of hip hiking, vaulting, or antalgia for return to PLOF to be within functional norms for age.    Baseline 11/19: unable to perform    Time 6    Period Weeks    Status New    Target Date 11/12/20                 Plan - 10/20/20 1257    Clinical Impression Statement Patient is progressing with functional range of motion with PROM flexion in supine being 111. She is able to perform full forward and backward circle on upright bike without resistance at this time and is beginning to demonstrate improved quad control and stabilization. Patient to see surgeon on Friday and will follow up on  brace wear and resistance progression. Patient will benefit from skilled physical therapy to increase strength, ROM, and stabilization to RLE for return to PLOF    Personal Factors and Comorbidities Comorbidity 2;Past/Current Experience;Profession    Comorbidities three previous MPFL surgeries, anemia    Examination-Activity Limitations Bathing;Bed Mobility;Caring for Others;Carry;Stairs;Squat;Sleep;Sit;Locomotion Level;Lift;Stand;Transfers    Examination-Participation Restrictions Cleaning;Community Activity;Meal Prep;Laundry;Shop;Yard Work;Occupation    Stability/Clinical Decision Making Stable/Uncomplicated    Rehab Potential Good    PT Frequency 2x / week    PT Duration 6 weeks    PT Treatment/Interventions ADLs/Self Care Home Management;Aquatic Therapy;Cryotherapy;Electrical Stimulation;Biofeedback;Iontophoresis 4mg /ml Dexamethasone;Moist Heat;Ultrasound;Functional mobility training;Stair training;Gait training;DME Instruction;Therapeutic activities;Therapeutic exercise;Balance training;Neuromuscular re-education;Manual techniques;Patient/family education;Compression bandaging;Scar mobilization;Passive range of motion;Dry needling;Taping;Splinting;Energy conservation;Orthotic Fit/Training    PT Next Visit Plan ROM, strength    PT Home Exercise Plan see above    Consulted and Agree with Plan of Care Patient           Patient will benefit from skilled therapeutic intervention in order to improve the following deficits and impairments:  Abnormal gait, Decreased activity tolerance, Decreased endurance, Decreased mobility, Decreased range of motion, Difficulty walking, Decreased strength, Decreased scar mobility, Increased edema, Impaired flexibility, Impaired perceived functional ability, Improper body mechanics, Pain, Increased muscle spasms, Impaired sensation, Postural dysfunction  Visit Diagnosis: Stiffness of right knee, not elsewhere classified  Other abnormalities of gait and  mobility  Muscle weakness (generalized)     Problem List Patient Active Problem List   Diagnosis Date Noted  . Vitamin B12 deficiency 12/18/2019  . Plant irritant contact dermatitis 06/17/2019  . Iron deficiency anemia 12/09/2018  . Preventative health care 12/04/2016  .  Seasonal allergic rhinitis 11/27/2014   Janna Arch, PT, DPT   10/20/2020, 12:58 PM  [place] MAIN [facility] SERVICES [address] [place], [place], [address] Phone: [phone]   Fax:  [phone]  Name: Dawn Hamilton MRN: [id number] Date of Birth: 1976-06-11

## 2020-10-22 DIAGNOSIS — M2351 Chronic instability of knee, right knee: Secondary | ICD-10-CM | POA: Diagnosis not present

## 2020-10-28 ENCOUNTER — Ambulatory Visit: Payer: 59

## 2020-10-29 ENCOUNTER — Ambulatory Visit: Payer: 59

## 2020-10-29 ENCOUNTER — Other Ambulatory Visit: Payer: Self-pay

## 2020-10-29 DIAGNOSIS — R2689 Other abnormalities of gait and mobility: Secondary | ICD-10-CM

## 2020-10-29 DIAGNOSIS — M25661 Stiffness of right knee, not elsewhere classified: Secondary | ICD-10-CM

## 2020-10-29 DIAGNOSIS — M6281 Muscle weakness (generalized): Secondary | ICD-10-CM | POA: Diagnosis not present

## 2020-10-29 NOTE — Therapy (Signed)
[place] MAIN [facility] SERVICES [address] [place], [place], [address] Phone: [phone]   Fax:  [phone]  Physical Therapy Treatment  Patient Details  Name: Dawn Hamilton MRN: [id number] Date of Birth: 29-May-1976 Referring Provider (PT): Hiram Gash   Encounter Date: 10/29/2020   PT End of Session - 10/29/20 1155    Visit Number 8    Number of Visits 12    Date for PT Re-Evaluation 11/12/20    Authorization Type 8/10 eval 10/01/20    PT Start Time 0846    PT Stop Time 0931    PT Time Calculation (min) 45 min    Activity Tolerance Patient tolerated treatment well    Behavior During Therapy [facility] for tasks assessed/performed           Past Medical History:  Diagnosis Date  . Allergy   . Anxiety   . Depression   . Precancerous skin lesion     Past Surgical History:  Procedure Laterality Date  . KNEE ARTHROSCOPY WITH MEDIAL COLLATERAL LIGAMENT RECONSTRUCTION Right 09/23/2020   Procedure: RIGHT KNEE ARTHROSCOPY DEBRIDMENT  WITH MEDIAL COLLATERAL LIGAMENT RECONSTRUCTION;  Surgeon: Hiram Gash, MD;  Location: [place];  Service: Orthopedics;  Laterality: Right;  . KNEE ARTHROSCOPY WITH MEDIAL PATELLAR FEMORAL LIGAMENT RECONSTRUCTION Right 10/02/2019   Procedure: KNEE ARTHROSCOPY WITH MEDIAL PATELLAR FEMORAL LIGAMENT RECONSTRUCTION WITH ALLOGRAPH;  Surgeon: Hiram Gash, MD;  Location: [place];  Service: Orthopedics;  Laterality: Right;  . KNEE SURGERY     x3    There were no vitals filed for this visit.   Subjective Assessment - 10/29/20 1152    Subjective Patient has been cleared to wean from brace. Has no restrictions other than being careful for strain and slow gradual increase in resistance. Planning on return to jog in februrary.    Pertinent History Patient is a 44 year old female s/p Right MCL reconstruction with tibialis anterior allograft on 09/23/20. 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 Last march she was having some pain and an MRI of the R knee showed positive MCl sprain but with no full thickness MCL injury. She was treated conservatively with J&J brace. She did not have any improvements and had worsening of sharp pains. 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    Limitations Sitting;Lifting;Standing;Walking;House hold activities    How long can you sit comfortably? requires J & J brace, uncomfortable for prolonged sitting    How long can you stand comfortably? WBAT with J &J brace    How long can you walk comfortably? today is first day WBAT with J& J brace    Patient Stated Goals to return to PLOF.    Currently in Pain? Yes    Pain Score 1     Pain Location Knee    Pain Orientation Right    Pain Descriptors / Indicators Aching    Pain Type Surgical pain    Pain Onset In the past 7 days    Pain Frequency Constant             Treatment:  Bike Stationary  bikeseat level 15: lvl 2 ; forward, backwards for increasing arc of motion with focus on heel press rather than pushing through toes, control of eccentric and concentric portion of range  Standing:  RTB around RLE with cue to push into abduction, sit to stand from lowered plinth table 10x Single limb stance with UE support, tactile cues for positioning onto surgical limb for neutral alignment rather than preferred internal rotation Toes on 2x4, knees together, partial squat with focus on quad activation 10x Side stepping 4x length of // bars with focus on neutral alignment of RLE when stabilizing in single limb position  modified single limb  squat with LUE on step, UE support 10x   Supine:  Bridge 8x with RTB around knees, due to preference to push with LLE, intervention changed to modified single limb bridge RLE with LLE straight on table 10x, very challenging 2lb ankle weight: SLR 12x with cues for quad activation prior to lift Hamstring lengthening on PT shoulder 60 second holds  Prone:  froggers 10x with cues for gluteal activation Straight leg hip extension RLE 10x   Quadruped: Quadruped to childpose for R knee flexion 4x 20 second holds Quadruped with fire hydrant 10x each LE, challenging to stabilize on surgical RLE.   Seated: 3 way LAQ with 2 lb weight 10x each direction     Pt educated throughout session about proper posture and technique with exercises. Improved exercise technique, movement at target joints, use of target muscles after min to mod verbal, visual, tactile cues                      PT Education - 10/29/20 1155    Education Details exercise technique, body mechanics, weight acceptance    Person(s) Educated Patient    Methods Explanation;Demonstration;Tactile cues;Verbal cues    Comprehension Verbalized understanding;Returned demonstration;Verbal cues required;Tactile cues required            PT Short Term Goals - 10/01/20 1214      PT SHORT TERM GOAL #1   Title Patient will be independent in home exercise program to improve strength/mobility for better functional independence with ADLs.    Baseline 11/19: HEP given    Time 2    Period Weeks    Status New    Target Date 10/15/20      PT SHORT TERM GOAL #2   Title Patient will demonstrate 90 degrees of flexion and neutral knee extension with active range of motion of R knee for improved gait mechanics and muscular activation/alignment    Baseline 11/19: 57 flexion -1 extension    Time 2    Period Weeks    Status New    Target Date 10/15/20             PT Long Term Goals - 10/01/20 1216      PT LONG TERM GOAL  #1   Title Patient will demonstrate >/=120 degrees of flexion and neutral extension with active range of motion of R knee  for improved gait mechanics and muscular activation/alignment and return to functional ROM    Baseline 11/19: 57 flexion, -1 extension    Time 6    Period Weeks    Status New    Target Date 11/12/20      PT LONG TERM GOAL #2   Title Patient will increase BLE gross strength to 4+/5 as to improve functional strength for independent gait, increased standing tolerance and increased ADL ability.  Baseline 11/19: see note    Time 6    Period Weeks    Status New    Target Date 11/12/20      PT LONG TERM GOAL #3   Title Patient will increase lower extremity functional scale to >60/80 to demonstrate improved functional mobility and increased tolerance with ADLs.    Baseline 11/19: 32/808    Time 6    Period Weeks    Status New    Target Date 11/12/20      PT LONG TERM GOAL #4   Title Patient will increase FOTO score to equal to or greater than   67%  to demonstrate statistically significant improvement in mobility and quality of life.    Baseline 11/19: 43%    Time 6    Period Weeks    Status New    Target Date 11/12/20      PT LONG TERM GOAL #5   Title Patient will perform a squat with equal weight shift and no LOB or increase of pain >1 degree on the VAS scale for functional activty performance and return to PLOF.    Baseline 11/19: unable    Time 6    Period Weeks    Status New    Target Date 11/12/20      PT LONG TERM GOAL #6   Title Patient will perform the 6 minute walk test for >1000 ft with no episodes of hip hiking, vaulting, or antalgia for return to PLOF to be within functional norms for age.    Baseline 11/19: unable to perform    Time 6    Period Weeks    Status New    Target Date 11/12/20                 Plan - 10/29/20 1215    Clinical Impression Statement Patient is progressing with functional strength and mobility. She tolerated  progression to close chained interventions with cueing for  Neutral alignment due to internal rotation preference. Patient's ROM is now functional and focus will shift to strengthening and alignment while maintaining range. Patient will benefit from skilled physical therapy to increase strength, ROM, and stabilization to RLE for return to PLOF    Personal Factors and Comorbidities Comorbidity 2;Past/Current Experience;Profession    Comorbidities three previous MPFL surgeries, anemia    Examination-Activity Limitations Bathing;Bed Mobility;Caring for Others;Carry;Stairs;Squat;Sleep;Sit;Locomotion Level;Lift;Stand;Transfers    Examination-Participation Restrictions Cleaning;Community Activity;Meal Prep;Laundry;Shop;Yard Work;Occupation    Stability/Clinical Decision Making Stable/Uncomplicated    Rehab Potential Good    PT Frequency 2x / week    PT Duration 6 weeks    PT Treatment/Interventions ADLs/Self Care Home Management;Aquatic Therapy;Cryotherapy;Electrical Stimulation;Biofeedback;Iontophoresis 4mg /ml Dexamethasone;Moist Heat;Ultrasound;Functional mobility training;Stair training;Gait training;DME Instruction;Therapeutic activities;Therapeutic exercise;Balance training;Neuromuscular re-education;Manual techniques;Patient/family education;Compression bandaging;Scar mobilization;Passive range of motion;Dry needling;Taping;Splinting;Energy conservation;Orthotic Fit/Training    PT Next Visit Plan ROM, strength    PT Home Exercise Plan see above    Consulted and Agree with Plan of Care Patient           Patient will benefit from skilled therapeutic intervention in order to improve the following deficits and impairments:  Abnormal gait,Decreased activity tolerance,Decreased endurance,Decreased mobility,Decreased range of motion,Difficulty walking,Decreased strength,Decreased scar mobility,Increased edema,Impaired flexibility,Impaired perceived functional ability,Improper body mechanics,Pain,Increased  muscle spasms,Impaired sensation,Postural dysfunction  Visit Diagnosis: Stiffness of right knee, not elsewhere classified  Other abnormalities of gait and mobility  Muscle weakness (generalized)     Problem List Patient Active Problem List   Diagnosis Date Noted  . Vitamin  B12 deficiency 12/18/2019  . Plant irritant contact dermatitis 06/17/2019  . Iron deficiency anemia 12/09/2018  . Preventative health care 12/04/2016  . Seasonal allergic rhinitis 11/27/2014   Janna Arch, PT, DPT   10/29/2020, 12:16 PM  [place] MAIN [facility] SERVICES [address] [place], [place], [address] Phone: [phone]   Fax:  [phone]  Name: Dawn Hamilton MRN: [id number] Date of Birth: 05/04/1976

## 2020-11-01 ENCOUNTER — Other Ambulatory Visit: Payer: Self-pay

## 2020-11-01 ENCOUNTER — Ambulatory Visit: Payer: 59

## 2020-11-01 DIAGNOSIS — M25661 Stiffness of right knee, not elsewhere classified: Secondary | ICD-10-CM

## 2020-11-01 DIAGNOSIS — M6281 Muscle weakness (generalized): Secondary | ICD-10-CM | POA: Diagnosis not present

## 2020-11-01 DIAGNOSIS — R2689 Other abnormalities of gait and mobility: Secondary | ICD-10-CM

## 2020-11-01 NOTE — Therapy (Signed)
[place] MAIN [facility] SERVICES [address] [place], [place], [address] Phone: [phone]   Fax:  [phone]  Physical Therapy Treatment  Patient Details  Name: Dawn Hamilton MRN: [id number] Date of Birth: [birth year] Referring Provider (PT): Hiram Gash   Encounter Date: 11/01/2020   PT End of Session - 11/01/20 0759    Visit Number 9    Number of Visits 12    Date for PT Re-Evaluation 11/12/20    Authorization Type 9/10 eval 10/01/20    PT Start Time 0800    PT Stop Time 0844    PT Time Calculation (min) 44 min    Activity Tolerance Patient tolerated treatment well    Behavior During Therapy [facility] for tasks assessed/performed           Past Medical History:  Diagnosis Date   Allergy    Anxiety    Depression    Precancerous skin lesion     Past Surgical History:  Procedure Laterality Date   KNEE ARTHROSCOPY WITH MEDIAL COLLATERAL LIGAMENT RECONSTRUCTION Right 09/23/2020   Procedure: RIGHT KNEE ARTHROSCOPY DEBRIDMENT  WITH MEDIAL COLLATERAL LIGAMENT RECONSTRUCTION;  Surgeon: Hiram Gash, MD;  Location: [place];  Service: Orthopedics;  Laterality: Right;   KNEE ARTHROSCOPY WITH MEDIAL PATELLAR FEMORAL LIGAMENT RECONSTRUCTION Right 10/02/2019   Procedure: KNEE ARTHROSCOPY WITH MEDIAL PATELLAR FEMORAL LIGAMENT RECONSTRUCTION WITH ALLOGRAPH;  Surgeon: Hiram Gash, MD;  Location: [place];  Service: Orthopedics;  Laterality: Right;   KNEE SURGERY     x3    There were no vitals filed for this visit.   Subjective Assessment - 11/01/20 1315    Subjective Patient has been compliant with HEP, was sore after last session but no large increases in pain.    Pertinent History Patient is a 44 year old female s/p Right MCL reconstruction with tibialis anterior allograft on 09/23/20. Patient had an additional MPFL on 09/2019 and has seen this therapist for therapy afterwards; as well as  additional MPFL surgery in 1996 and 2004. Last march she was having some pain and an MRI of the R knee showed positive MCl sprain but with no full thickness MCL injury. She was treated conservatively with J&J brace. She did not have any improvements and had worsening of sharp pains. 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    Limitations Sitting;Lifting;Standing;Walking;House hold activities    How long can you sit comfortably? requires J & J brace, uncomfortable for prolonged sitting    How long can you stand comfortably? WBAT with J &J brace    How long can you walk comfortably? today is first day WBAT with J& J brace    Patient Stated Goals to return to PLOF.    Currently in Pain? No/denies             Treatment:  Bike Stationary bike seat level 15: lvl 2 ; forward, backwards for increasing arc of motion with focus on heel press rather than pushing through toes, control of eccentric and concentric portion of range   Standing:  TRX squats modified, with cues for alignment of RLE 10x TRX modified lunge, cues for depth of squat for challenge but before compensatory mechanism 10x  TRX  modified single limb squat/hamstring stretch 10x each LE  airex pad: Single limb stance with UE support, tactile cues for positioning onto surgical limb for neutral alignment rather than preferred internal rotation airex pad: marching with finger tip support, cues for alignment and neutral placement of LE's. 10x each LE  Heels on 2x4, knees together, partial squat with focus on quad activation 10x  Supine: Ice cup massage 4 minutes    Prone:  Towel under distal aspect with PT overpressure 4x 20 second holds  2.5 ankle weight: - hamstring curl 10x  RLE -Bent leg leg hip extension RLE 10x    Quadruped: Quadruped to childpose for R knee flexion 4x 20 second holds  modified tall kneel 10x with cues for gluteal squeeze   Seated: 3 way LAQ with 2.5 lb weight 10x each direction     Pt educated throughout session about proper posture and technique with exercises. Improved exercise technique, movement at target joints, use of target muscles after min to mod verbal, visual, tactile cues                            PT Education - 11/01/20 0758    Education Details exercise technique, body mechanics, strengthening    Person(s) Educated Patient    Methods Explanation;Demonstration;Tactile cues    Comprehension Verbalized understanding;Returned demonstration;Verbal cues required;Tactile cues required            PT Short Term Goals - 10/01/20 1214      PT SHORT TERM GOAL #1   Title Patient will be independent in home exercise program to improve strength/mobility for better functional independence with ADLs.    Baseline 11/19: HEP given    Time 2    Period Weeks    Status New    Target Date 10/15/20      PT SHORT TERM GOAL #2   Title Patient will demonstrate 90 degrees of flexion and neutral knee extension with active range of motion of R knee for improved gait mechanics and muscular activation/alignment    Baseline 11/19: 57 flexion -1 extension    Time 2    Period Weeks    Status New    Target Date 10/15/20             PT Long Term Goals - 10/01/20 1216      PT LONG TERM GOAL #1   Title Patient will demonstrate >/=120 degrees of flexion and neutral extension with active range of motion of R knee  for improved gait mechanics and muscular activation/alignment and return to functional ROM    Baseline 11/19: 57 flexion, -1 extension    Time 6    Period Weeks    Status New    Target Date 11/12/20      PT LONG TERM GOAL #2   Title Patient will increase BLE gross strength to 4+/5 as to improve  functional strength for independent gait, increased standing tolerance and increased ADL ability.    Baseline 11/19: see note    Time 6    Period Weeks    Status New    Target Date 11/12/20      PT LONG TERM GOAL #3   Title Patient will increase lower extremity functional scale to >60/80 to demonstrate improved functional mobility and increased tolerance with ADLs.    Baseline 11/19: 32/808    Time 6    Period Weeks    Status New  Target Date 11/12/20      PT LONG TERM GOAL #4   Title Patient will increase FOTO score to equal to or greater than   67%  to demonstrate statistically significant improvement in mobility and quality of life.    Baseline 11/19: 43%    Time 6    Period Weeks    Status New    Target Date 11/12/20      PT LONG TERM GOAL #5   Title Patient will perform a squat with equal weight shift and no LOB or increase of pain >1 degree on the VAS scale for functional activty performance and return to PLOF.    Baseline 11/19: unable    Time 6    Period Weeks    Status New    Target Date 11/12/20      PT LONG TERM GOAL #6   Title Patient will perform the 6 minute walk test for >1000 ft with no episodes of hip hiking, vaulting, or antalgia for return to PLOF to be within functional norms for age.    Baseline 11/19: unable to perform    Time 6    Period Weeks    Status New    Target Date 11/12/20                 Plan - 11/01/20 1320    Clinical Impression Statement Patient is progressing with functional strength, tolerating interventions well with increased weight, challenge of surface level, and positioning. She continues to remain highly motivated and her functional range is progression near equal to non surgical limb. Single leg stability continues to be area of focus for neutralization of body mechanics to reduce preference for internal rotation. Patient will benefit from skilled physical therapy to increase strength, ROM, and stabilization to RLE for  return to PLOF    Personal Factors and Comorbidities Comorbidity 2;Past/Current Experience;Profession    Comorbidities three previous MPFL surgeries, anemia    Examination-Activity Limitations Bathing;Bed Mobility;Caring for Others;Carry;Stairs;Squat;Sleep;Sit;Locomotion Level;Lift;Stand;Transfers    Examination-Participation Restrictions Cleaning;Community Activity;Meal Prep;Laundry;Shop;Yard Work;Occupation    Stability/Clinical Decision Making Stable/Uncomplicated    Rehab Potential Good    PT Frequency 2x / week    PT Duration 6 weeks    PT Treatment/Interventions ADLs/Self Care Home Management;Aquatic Therapy;Cryotherapy;Electrical Stimulation;Biofeedback;Iontophoresis 4mg /ml Dexamethasone;Moist Heat;Ultrasound;Functional mobility training;Stair training;Gait training;DME Instruction;Therapeutic activities;Therapeutic exercise;Balance training;Neuromuscular re-education;Manual techniques;Patient/family education;Compression bandaging;Scar mobilization;Passive range of motion;Dry needling;Taping;Splinting;Energy conservation;Orthotic Fit/Training    PT Next Visit Plan ROM, strength    PT Home Exercise Plan see above    Consulted and Agree with Plan of Care Patient           Patient will benefit from skilled therapeutic intervention in order to improve the following deficits and impairments:  Abnormal gait,Decreased activity tolerance,Decreased endurance,Decreased mobility,Decreased range of motion,Difficulty walking,Decreased strength,Decreased scar mobility,Increased edema,Impaired flexibility,Impaired perceived functional ability,Improper body mechanics,Pain,Increased muscle spasms,Impaired sensation,Postural dysfunction  Visit Diagnosis: Stiffness of right knee, not elsewhere classified  Other abnormalities of gait and mobility  Muscle weakness (generalized)     Problem List Patient Active Problem List   Diagnosis Date Noted   Vitamin B12 deficiency 12/18/2019   Plant  irritant contact dermatitis 06/17/2019   Iron deficiency anemia 12/09/2018   Preventative health care 12/04/2016   Seasonal allergic rhinitis 11/27/2014   Janna Arch, PT, DPT   11/01/2020, 1:21 PM  [place] MAIN [facility] SERVICES [address] [place], [place], [address] Phone: [phone]   Fax:  [phone]  Name: Dawn Tello.  Hamilton MRN: [id number] Date of Birth: [birth year]

## 2020-11-03 ENCOUNTER — Ambulatory Visit: Payer: 59

## 2020-11-03 DIAGNOSIS — M25661 Stiffness of right knee, not elsewhere classified: Secondary | ICD-10-CM

## 2020-11-03 DIAGNOSIS — M6281 Muscle weakness (generalized): Secondary | ICD-10-CM

## 2020-11-03 DIAGNOSIS — R2689 Other abnormalities of gait and mobility: Secondary | ICD-10-CM | POA: Diagnosis not present

## 2020-11-03 NOTE — Therapy (Signed)
[place] MAIN [facility] SERVICES [address] [place], [place], [address] Phone: [phone]   Fax:  [phone]  Physical Therapy Treatment Physical Therapy Progress Note   Dates of reporting period  10/01/20   to   11/03/20  Patient Details  Name: Dawn Hamilton MRN: [id number] Date of Birth: 1976/01/24 Referring Provider (PT): Hiram Gash   Encounter Date: 11/03/2020   PT End of Session - 11/03/20 1255    Visit Number 10    Number of Visits 12    Date for PT Re-Evaluation 11/12/20    Authorization Type next session 1/10 PN 11/03/20    PT Start Time 0759    PT Stop Time 0845    PT Time Calculation (min) 46 min    Activity Tolerance Patient tolerated treatment well    Behavior During Therapy [facility] for tasks assessed/performed           Past Medical History:  Diagnosis Date  . Allergy   . Anxiety   . Depression   . Precancerous skin lesion     Past Surgical History:  Procedure Laterality Date  . KNEE ARTHROSCOPY WITH MEDIAL COLLATERAL LIGAMENT RECONSTRUCTION Right 09/23/2020   Procedure: RIGHT KNEE ARTHROSCOPY DEBRIDMENT  WITH MEDIAL COLLATERAL LIGAMENT RECONSTRUCTION;  Surgeon: Hiram Gash, MD;  Location: [place];  Service: Orthopedics;  Laterality: Right;  . KNEE ARTHROSCOPY WITH MEDIAL PATELLAR FEMORAL LIGAMENT RECONSTRUCTION Right 10/02/2019   Procedure: KNEE ARTHROSCOPY WITH MEDIAL PATELLAR FEMORAL LIGAMENT RECONSTRUCTION WITH ALLOGRAPH;  Surgeon: Hiram Gash, MD;  Location: [place];  Service: Orthopedics;  Laterality: Right;  . KNEE SURGERY     x3    There were no vitals filed for this visit.   Subjective Assessment - 11/03/20 1253    Subjective Patient reports compliance with HEP, is progressing with her ROM and strength.    Pertinent History Patient is a 44 year old female s/p Right MCL reconstruction with tibialis anterior allograft on 09/23/20. 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 Last march she was having some pain and an MRI of the R knee showed positive MCl sprain but with no full thickness MCL injury. She was treated conservatively with J&J brace. She did not have any improvements and had worsening of sharp pains. 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    Limitations Sitting;Lifting;Standing;Walking;House hold activities    How long can you sit comfortably? requires J & J brace, uncomfortable for prolonged sitting    How long can you stand comfortably? WBAT with J &J brace    How long can you walk comfortably? today is first day WBAT with J& J brace    Patient Stated Goals to return to PLOF.    Currently in Pain? No/denies                 Goals:  ROM : flexion 134 : (LLE is 140 ) BLE strength  Right Left  Hip flexion 4+ 5  Hip Abduction 4 5  Hip extension  4 5  Hip Adduction 4 5  Knee Extension  4 5  Knee Flexion 4 5  DF 4+ 5  PF 4+ 5   FOTO: 63%  LEFS: 57/80  Squat: slight weight shift onto nonsurgical LLE 6 min walk test: 1565 feet.    Treatment: Supine:  2.5 ankle weight SLR with cues for quad set prior to lift 12x 2.5 ankle weight bicycle kick RLE 12x  Scar tissue massage 4 minutes  Seated: 2.5 ankle weight three way LAQ 10x each direction Straight leg arc over target object 10x  Sit to stand RLE only from raised plinth table, UE support on PT arm 8x cues for improving control and decreasing speed during eccentric portion.   Standing: TRX:  Lateral modified squat with return to center position alternating direction, 10x each side,  modified posterior split squat  with focus on quad activation of RLE 10x Forward backward toe tap of LLE for RLE stabilization 10x    Patient's condition has the potential to improve in response to therapy. Maximum improvement is yet to be obtained. The anticipated improvement is attainable and reasonable in a generally predictable time.  Patient reports improved flexibility but is not yet cleared for running.                  PT Education - 11/03/20 1254    Education Details exercise technique, body mechanics, goals, POC    Person(s) Educated Patient    Methods Explanation;Demonstration;Tactile cues;Verbal cues    Comprehension Verbalized understanding;Returned demonstration;Verbal cues required;Tactile cues required            PT Short Term Goals - 11/03/20 0923      PT SHORT TERM GOAL #1   Title Patient will be independent in home exercise program to improve strength/mobility for better functional independence with ADLs.    Baseline 11/19: HEP given 12/22: HEP compliant    Time 2    Period Weeks    Status Achieved    Target Date 10/15/20      PT SHORT TERM GOAL #2   Title Patient will demonstrate 90 degrees of flexion and neutral knee extension with active range of motion of R knee for improved gait mechanics and muscular activation/alignment    Baseline 11/19: 57 flexion -1 extension 12/22: 134 AROM flexion    Time 2    Period Weeks    Status Achieved    Target Date 10/15/20             PT Long Term Goals - 11/03/20 0923      PT LONG TERM GOAL #1   Title Patient will demonstrate >/=120 degrees of flexion and neutral extension with active range of motion of R knee  for improved gait mechanics and muscular activation/alignment and return to functional ROM    Baseline 11/19: 57 flexion, -1 extension 12/22: 134 active flexion    Time 6    Period Weeks    Status Achieved      PT LONG TERM GOAL #2   Title Patient will increase BLE gross strength to 4+/5 as to improve functional strength  for independent gait, increased standing tolerance and increased ADL ability.    Baseline 11/19: see note 12/22: see note    Time 6    Period Weeks    Status Partially Met    Target Date 11/12/20      PT LONG TERM GOAL #3   Title Patient will increase lower extremity functional scale to >60/80 to demonstrate improved functional mobility and increased tolerance with ADLs.    Baseline 11/19: 32/808 12/23: 57/80  Time 6    Period Weeks    Status Partially Met    Target Date 11/12/20      PT LONG TERM GOAL #4   Title Patient will increase FOTO score to equal to or greater than   67%  to demonstrate statistically significant improvement in mobility and quality of life.    Baseline 11/19: 43% 12/23: 63%    Time 6    Period Weeks    Status Partially Met    Target Date 11/12/20      PT LONG TERM GOAL #5   Title Patient will perform a squat with equal weight shift and no LOB or increase of pain >1 degree on the VAS scale for functional activty performance and return to PLOF.    Baseline 11/19: unable 12/23: weight shift onto non surgical limb    Time 6    Period Weeks    Status Partially Met    Target Date 11/12/20      PT LONG TERM GOAL #6   Title Patient will perform the 6 minute walk test for >1000 ft with no episodes of hip hiking, vaulting, or antalgia for return to PLOF to be within functional norms for age.    Baseline 11/19: unable to perform 12/23: 1565 ft    Time 6    Period Weeks    Status Achieved                 Plan - 11/04/20 1610    Clinical Impression Statement Patient demonstrates excellent progress towards goals at this time. She has met her ROM goal and now is only 4 degrees from her normal baseline. Her strength is improving and her gait is above community level however is not yet to age norm. Weight acceptance onto surgical limb is improving however depth of squat results in compensatory shift.  Patient's condition has the potential to improve in  response to therapy. Maximum improvement is yet to be obtained. The anticipated improvement is attainable and reasonable in a generally predictable time. Patient will benefit from skilled physical therapy to increase strength, ROM, and stabilization to RLE for return to PLOF    Personal Factors and Comorbidities Comorbidity 2;Past/Current Experience;Profession    Comorbidities three previous MPFL surgeries, anemia    Examination-Activity Limitations Bathing;Bed Mobility;Caring for Others;Carry;Stairs;Squat;Sleep;Sit;Locomotion Level;Lift;Stand;Transfers    Examination-Participation Restrictions Cleaning;Community Activity;Meal Prep;Laundry;Shop;Yard Work;Occupation    Stability/Clinical Decision Making Stable/Uncomplicated    Rehab Potential Good    PT Frequency 2x / week    PT Duration 6 weeks    PT Treatment/Interventions ADLs/Self Care Home Management;Aquatic Therapy;Cryotherapy;Electrical Stimulation;Biofeedback;Iontophoresis 45m/ml Dexamethasone;Moist Heat;Ultrasound;Functional mobility training;Stair training;Gait training;DME Instruction;Therapeutic activities;Therapeutic exercise;Balance training;Neuromuscular re-education;Manual techniques;Patient/family education;Compression bandaging;Scar mobilization;Passive range of motion;Dry needling;Taping;Splinting;Energy conservation;Orthotic Fit/Training    PT Next Visit Plan ROM, strength    PT Home Exercise Plan see above    Consulted and Agree with Plan of Care Patient           Patient will benefit from skilled therapeutic intervention in order to improve the following deficits and impairments:  Abnormal gait,Decreased activity tolerance,Decreased endurance,Decreased mobility,Decreased range of motion,Difficulty walking,Decreased strength,Decreased scar mobility,Increased edema,Impaired flexibility,Impaired perceived functional ability,Improper body mechanics,Pain,Increased muscle spasms,Impaired sensation,Postural dysfunction  Visit  Diagnosis: Stiffness of right knee, not elsewhere classified  Other abnormalities of gait and mobility  Muscle weakness (generalized)     Problem List Patient Active Problem List   Diagnosis Date Noted  . Vitamin B12 deficiency 12/18/2019  . Plant irritant contact dermatitis 06/17/2019  .  Iron deficiency anemia 12/09/2018  . Preventative health care 12/04/2016  . Seasonal allergic rhinitis 11/27/2014   Janna Arch, PT, DPT   11/04/2020, 9:39 AM  [place] MAIN [facility] SERVICES [address] [place], [place], [address] Phone: [phone]   Fax:  [phone]  Name: Keyana Guevara MRN: [id number] Date of Birth: [birth year]

## 2020-11-15 ENCOUNTER — Other Ambulatory Visit: Payer: Self-pay

## 2020-11-15 ENCOUNTER — Ambulatory Visit: Payer: 59 | Attending: Orthopaedic Surgery

## 2020-11-15 DIAGNOSIS — R2689 Other abnormalities of gait and mobility: Secondary | ICD-10-CM | POA: Diagnosis not present

## 2020-11-15 DIAGNOSIS — M25661 Stiffness of right knee, not elsewhere classified: Secondary | ICD-10-CM | POA: Diagnosis not present

## 2020-11-15 DIAGNOSIS — M6281 Muscle weakness (generalized): Secondary | ICD-10-CM | POA: Diagnosis not present

## 2020-11-15 NOTE — Therapy (Signed)
[place] MAIN [facility] SERVICES [address] [place], [place], [address] Phone: [phone]   Fax:  [phone]  Physical Therapy Treatment/RECERT  Patient Details  Name: Dawn Hamilton MRN: [id number] Date of Birth: 1976/04/13 Referring Provider (PT): Hiram Gash   Encounter Date: 11/15/2020   PT End of Session - 11/15/20 1017    Visit Number 11    Number of Visits 23    Date for PT Re-Evaluation 12/27/20    Authorization Type 1/10 PN 11/03/20    PT Start Time 1020    PT Stop Time 1107    PT Time Calculation (min) 47 min    Activity Tolerance Patient tolerated treatment well    Behavior During Therapy [facility] for tasks assessed/performed           Past Medical History:  Diagnosis Date  . Allergy   . Anxiety   . Depression   . Precancerous skin lesion     Past Surgical History:  Procedure Laterality Date  . KNEE ARTHROSCOPY WITH MEDIAL COLLATERAL LIGAMENT RECONSTRUCTION Right 09/23/2020   Procedure: RIGHT KNEE ARTHROSCOPY DEBRIDMENT  WITH MEDIAL COLLATERAL LIGAMENT RECONSTRUCTION;  Surgeon: Hiram Gash, MD;  Location: [place];  Service: Orthopedics;  Laterality: Right;  . KNEE ARTHROSCOPY WITH MEDIAL PATELLAR FEMORAL LIGAMENT RECONSTRUCTION Right 10/02/2019   Procedure: KNEE ARTHROSCOPY WITH MEDIAL PATELLAR FEMORAL LIGAMENT RECONSTRUCTION WITH ALLOGRAPH;  Surgeon: Hiram Gash, MD;  Location: [place];  Service: Orthopedics;  Laterality: Right;  . KNEE SURGERY     x3    There were no vitals filed for this visit.   Subjective Assessment - 11/15/20 1219    Subjective Patient reports no falls, has been compliant with HEP. Is feeling more stable today.    Pertinent History Patient is a 45 year old female s/p Right MCL reconstruction with tibialis anterior allograft on 09/23/20. Patient had an additional MPFL on 09/2019 and has seen this therapist for therapy afterwards; as well as additional  MPFL surgery in 1996 and 2004. Last march she was having some pain and an MRI of the R knee showed positive MCl sprain but with no full thickness MCL injury. She was treated conservatively with J&J brace. She did not have any improvements and had worsening of sharp pains. 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    Limitations Sitting;Lifting;Standing;Walking;House hold activities    How long can you sit comfortably? requires J & J brace, uncomfortable for prolonged sitting    How long can you stand comfortably? WBAT with J &J brace    How long can you walk comfortably? today is first day WBAT with J& J brace    Patient Stated Goals to return to PLOF.    Currently in Pain? No/denies              RECERT Increase 6 MWT Give SLS goal   Treatment:   Stationary bike seat level 15: increasing/decreasing resistance ; forward, backwards for increasing arc of motion with focus on heel press rather than pushing through toes, control of eccentric and concentric portion of range   Treadmill backwards ambulation 1.3-1.6 rpm. 2 minutes, focus on neutral alignment and muscle contraction.   In side gym:  4 way  shuffle 10-15 ft each direction, focus on neutral alignment for stabilization with mobility. 3 sets  15lb RDL with modified widened BOS cues for abduction 10x   In PT gym:  Standing:  TRX posterior squat to single limb stance on LLE 10x TRX modified lateral step to single limb stance 10x each side   KoreBalance: Lvl 2 weight shift intervention x 2 minutes, focus on stabilization interventions with timing of weight shift Penguin weight shift game 2 minutes for stabilization, timing of muscle recruitment, and spatial awareness  Sit to  stand SLS squat LLE 10x no UE support from raised plinth table 10x  Prone:  4lb weight: hip extension 10x Froggers, 10x: second set with 3 set pulse each rep 10x Towel under distal aspect of knee: progressive knee flexion 3x30 second holds  Supine: Scar tissue massage x3 minutes Patellar mobilizations; inferior, superior, medial, lateral. 3x10 seconds each direction      Pt educated throughout session about proper posture and technique with exercises. Improved exercise technique, movement at target joints, use of target muscles after min to mod verbal, visual, tactile cues                          PT Education - 11/15/20 1017    Education Details exercise technique, body mechanics, goals, POC    Person(s) Educated Patient    Methods Explanation;Demonstration;Tactile cues;Verbal cues    Comprehension Verbalized understanding;Returned demonstration;Verbal cues required;Tactile cues required            PT Short Term Goals - 11/03/20 0923      PT SHORT TERM GOAL #1   Title Patient will be independent in home exercise program to improve strength/mobility for better functional independence with ADLs.    Baseline 11/19: HEP given 12/22: HEP compliant    Time 2    Period Weeks    Status Achieved    Target Date 10/15/20      PT SHORT TERM GOAL #2   Title Patient will demonstrate 90 degrees of flexion and neutral knee extension with active range of motion of R knee for improved gait mechanics and muscular activation/alignment    Baseline 11/19: 57 flexion -1 extension 12/22: 134 AROM flexion    Time 2    Period Weeks    Status Achieved    Target Date 10/15/20             PT Long Term Goals - 11/15/20 1236      PT LONG TERM GOAL #1   Title Patient will demonstrate >/=120 degrees of flexion and neutral extension with active range of motion of R knee  for improved gait mechanics and muscular activation/alignment and return to functional ROM    Baseline  11/19: 57 flexion, -1 extension 12/22: 134 active flexion    Time 6    Period Weeks    Status Achieved      PT LONG TERM GOAL #2   Title Patient will increase BLE gross strength to 4+/5 as to improve functional strength for independent gait, increased standing tolerance and increased ADL ability.    Baseline 11/19: see note 12/22: see note    Time 6    Period Weeks    Status Partially Met    Target Date 12/27/20      PT LONG TERM GOAL #3   Title Patient will increase lower extremity functional scale to >60/80 to demonstrate improved functional mobility and increased tolerance with ADLs.  Baseline 11/19: 32/808 12/23: 57/80    Time 6    Period Weeks    Status Partially Met    Target Date 12/27/20      PT LONG TERM GOAL #4   Title Patient will increase FOTO score to equal to or greater than   67%  to demonstrate statistically significant improvement in mobility and quality of life.    Baseline 11/19: 43% 12/23: 63%    Time 6    Period Weeks    Status Partially Met    Target Date 12/27/20      PT LONG TERM GOAL #5   Title Patient will perform a squat with equal weight shift and no LOB or increase of pain >1 degree on the VAS scale for functional activty performance and return to PLOF.    Baseline 11/19: unable 12/23: weight shift onto non surgical limb; 11/15/20: near full squat with no pain increase, slight loss of weight acceptance at final 15% of squat    Time 6    Period Weeks    Status Partially Met    Target Date 12/27/20      Additional Long Term Goals   Additional Long Term Goals Yes      PT LONG TERM GOAL #6   Title Patient will perform the 6 minute walk test for >1000 ft with no episodes of hip hiking, vaulting, or antalgia for return to PLOF to be within functional norms for age.    Baseline 11/19: unable to perform 12/23: 1565 ft    Time 6    Period Weeks    Status Achieved      PT LONG TERM GOAL #7   Title Patient will tolerate 20 seconds of single leg stance  without loss of balance to improve ability to get in and out of shower safely, return to gym activities, and return to PLOF.    Baseline 1/3: limited tolerance    Time 6    Period Weeks    Status New    Target Date 12/27/20      PT LONG TERM GOAL #8   Title Patient will perform the 6 minute walk test for >1800 ft with no episodes of hip hiking, vaulting, or antalgia for return to PLOF to be within functional norms for age.    Baseline 12/23: 1565 ft    Time 6    Period Weeks    Status New    Target Date 12/27/20                 Plan - 11/15/20 1244    Clinical Impression Statement Majority of goals performed last session on 11/04/20, please refer to this note for further details. Progression of goals to include age norm capacity for mobility, single limb stabilization, and functional movements. Weight acceptance onto surgical limb is improving however depth of squat results in compensatory shift. Her ROM is functional now and primary focus will be stabilization, neutral alignment, and strengthening. Patient will benefit from skilled physical therapy to increase strength, alignment, and stabilization to RLE for return to PLOF    Personal Factors and Comorbidities Comorbidity 2;Past/Current Experience;Profession    Comorbidities three previous MPFL surgeries, anemia    Examination-Activity Limitations Bathing;Bed Mobility;Caring for Others;Carry;Stairs;Squat;Sleep;Sit;Locomotion Level;Lift;Stand;Transfers    Examination-Participation Restrictions Cleaning;Community Activity;Meal Prep;Laundry;Shop;Yard Work;Occupation    Stability/Clinical Decision Making Stable/Uncomplicated    Rehab Potential Good    PT Frequency 2x / week    PT Duration 6 weeks  PT Treatment/Interventions ADLs/Self Care Home Management;Aquatic Therapy;Cryotherapy;Electrical Stimulation;Biofeedback;Iontophoresis 39m/ml Dexamethasone;Moist Heat;Ultrasound;Functional mobility training;Stair training;Gait training;DME  Instruction;Therapeutic activities;Therapeutic exercise;Balance training;Neuromuscular re-education;Manual techniques;Patient/family education;Compression bandaging;Scar mobilization;Passive range of motion;Dry needling;Taping;Splinting;Energy conservation;Orthotic Fit/Training    PT Next Visit Plan ROM, strength    PT Home Exercise Plan see above    Consulted and Agree with Plan of Care Patient           Patient will benefit from skilled therapeutic intervention in order to improve the following deficits and impairments:  Abnormal gait,Decreased activity tolerance,Decreased endurance,Decreased mobility,Decreased range of motion,Difficulty walking,Decreased strength,Decreased scar mobility,Increased edema,Impaired flexibility,Impaired perceived functional ability,Improper body mechanics,Pain,Increased muscle spasms,Impaired sensation,Postural dysfunction  Visit Diagnosis: Stiffness of right knee, not elsewhere classified  Other abnormalities of gait and mobility  Muscle weakness (generalized)     Problem List Patient Active Problem List   Diagnosis Date Noted  . Vitamin B12 deficiency 12/18/2019  . Plant irritant contact dermatitis 06/17/2019  . Iron deficiency anemia 12/09/2018  . Preventative health care 12/04/2016  . Seasonal allergic rhinitis 11/27/2014   MJanna Hamilton PT, DPT   11/15/2020, 12:44 PM  [place] [facility] [address] [address]Phone: [phone]  Fax:  [phone] Name: BJenniefer SalakMRN: [id number] of Birth: 2Aug 12, 1977

## 2020-11-17 ENCOUNTER — Ambulatory Visit: Payer: 59

## 2020-11-17 ENCOUNTER — Other Ambulatory Visit: Payer: Self-pay

## 2020-11-17 DIAGNOSIS — M25661 Stiffness of right knee, not elsewhere classified: Secondary | ICD-10-CM | POA: Diagnosis not present

## 2020-11-17 DIAGNOSIS — R2689 Other abnormalities of gait and mobility: Secondary | ICD-10-CM

## 2020-11-17 DIAGNOSIS — M6281 Muscle weakness (generalized): Secondary | ICD-10-CM | POA: Diagnosis not present

## 2020-11-17 NOTE — Therapy (Signed)
[place] MAIN [facility] SERVICES [address] [place], [place], [address] Phone: [phone]   Fax:  [phone]  Physical Therapy Treatment  Patient Details  Name: Dawn Hamilton MRN: [id number] Date of Birth: 1976/08/16 Referring Provider (PT): Hiram Gash   Encounter Date: 11/17/2020   PT End of Session - 11/17/20 1822    Visit Number 12    Number of Visits 23    Date for PT Re-Evaluation 12/27/20    Authorization Type 2/10 PN 11/03/20    PT Start Time 0930    PT Stop Time 1012    PT Time Calculation (min) 42 min    Activity Tolerance Patient tolerated treatment well    Behavior During Therapy [facility] for tasks assessed/performed           Past Medical History:  Diagnosis Date  . Allergy   . Anxiety   . Depression   . Precancerous skin lesion     Past Surgical History:  Procedure Laterality Date  . KNEE ARTHROSCOPY WITH MEDIAL COLLATERAL LIGAMENT RECONSTRUCTION Right 09/23/2020   Procedure: RIGHT KNEE ARTHROSCOPY DEBRIDMENT  WITH MEDIAL COLLATERAL LIGAMENT RECONSTRUCTION;  Surgeon: Hiram Gash, MD;  Location: [place];  Service: Orthopedics;  Laterality: Right;  . KNEE ARTHROSCOPY WITH MEDIAL PATELLAR FEMORAL LIGAMENT RECONSTRUCTION Right 10/02/2019   Procedure: KNEE ARTHROSCOPY WITH MEDIAL PATELLAR FEMORAL LIGAMENT RECONSTRUCTION WITH ALLOGRAPH;  Surgeon: Hiram Gash, MD;  Location: [place];  Service: Orthopedics;  Laterality: Right;  . KNEE SURGERY     x3    There were no vitals filed for this visit.   Subjective Assessment - 11/17/20 1816    Subjective Patient reports compliance with HEP. No falls or LOB since last session. Reports some soreness after last session.    Pertinent History Patient is a 45 year old female s/p Right MCL reconstruction with tibialis anterior allograft on 09/23/20. Patient had an additional MPFL on 09/2019 and has seen this therapist for therapy afterwards; as  well as additional MPFL surgery in 1996 and 2004. Last march she was having some pain and an MRI of the R knee showed positive MCl sprain but with no full thickness MCL injury. She was treated conservatively with J&J brace. She did not have any improvements and had worsening of sharp pains. 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    Limitations Sitting;Lifting;Standing;Walking;House hold activities    How long can you sit comfortably? requires J & J brace, uncomfortable for prolonged sitting    How long can you stand comfortably? WBAT with J &J brace    How long can you walk comfortably? today is first day WBAT with J& J brace    Patient Stated Goals to return to PLOF.    Currently in Pain? No/denies                  Treatment:    Stationary bike seat level 15: increasing/decreasing resistance ; forward, backwards for increasing arc of motion with focus on heel press rather than pushing through toes, control of eccentric and concentric portion of range   Treadmill backwards ambulation 1.3-1.6 rpm. 2 minutes, focus on neutral alignment and muscle contraction.    Leg extension machine: 2 plates:  bilateral concentric; unilateral eccentric surgical limb 15x; 2 sets   In PT gym:  Standing: 4lb ankle weight step over cone and back : hip open into ER to step over and back 10x airex pad: surgical limb: -static single limb stance 30 seconds -forward backwards taps LLE with occasional need for finger tip support 12x -lateral LLE abduction with finger tip support 12x -modified forward heel tap squat 12x; very challenging; cues for knee abduction for neutral alignment.      Seated: 4lb ankle weight: LAQ 10x neutral, 10x IR, 10x  ER Straight leg semicircle lifting leg over cone for optimal quad activation 10x   Prone Towel under distal aspect of knee for optimal muscle tissue lengthening 3x30 second holds       Pt educated throughout session about proper posture and technique with exercises. Improved exercise technique, movement at target joints, use of target muscles after min to mod verbal, visual, tactile cues                    PT Education - 11/17/20 1817    Education Details exercise technique, body mechanics, quad control    Person(s) Educated Patient    Methods Explanation;Demonstration;Tactile cues;Verbal cues    Comprehension Verbalized understanding;Returned demonstration;Verbal cues required;Tactile cues required            PT Short Term Goals - 11/03/20 0923      PT SHORT TERM GOAL #1   Title Patient will be independent in home exercise program to improve strength/mobility for better functional independence with ADLs.    Baseline 11/19: HEP given 12/22: HEP compliant    Time 2    Period Weeks    Status Achieved    Target Date 10/15/20      PT SHORT TERM GOAL #2   Title Patient will demonstrate 90 degrees of flexion and neutral knee extension with active range of motion of R knee for improved gait mechanics and muscular activation/alignment    Baseline 11/19: 57 flexion -1 extension 12/22: 134 AROM flexion    Time 2    Period Weeks    Status Achieved    Target Date 10/15/20             PT Long Term Goals - 11/15/20 1236      PT LONG TERM GOAL #1   Title Patient will demonstrate >/=120 degrees of flexion and neutral extension with active range of motion of R knee  for improved gait mechanics and muscular activation/alignment and return to functional ROM    Baseline 11/19: 57 flexion, -1 extension 12/22: 134 active flexion    Time 6    Period Weeks    Status Achieved      PT LONG TERM GOAL #2   Title Patient will increase BLE gross strength to 4+/5 as to improve  functional strength for independent gait, increased standing tolerance and increased ADL ability.    Baseline 11/19: see note 12/22: see note    Time 6    Period Weeks    Status Partially Met    Target Date 12/27/20      PT LONG TERM GOAL #3   Title Patient will increase lower extremity functional scale to >60/80 to demonstrate improved functional mobility and increased tolerance with ADLs.    Baseline 11/19: 32/808 12/23: 57/80    Time 6    Period Weeks    Status Partially Met    Target Date 12/27/20  PT LONG TERM GOAL #4   Title Patient will increase FOTO score to equal to or greater than   67%  to demonstrate statistically significant improvement in mobility and quality of life.    Baseline 11/19: 43% 12/23: 63%    Time 6    Period Weeks    Status Partially Met    Target Date 12/27/20      PT LONG TERM GOAL #5   Title Patient will perform a squat with equal weight shift and no LOB or increase of pain >1 degree on the VAS scale for functional activty performance and return to PLOF.    Baseline 11/19: unable 12/23: weight shift onto non surgical limb; 11/15/20: near full squat with no pain increase, slight loss of weight acceptance at final 15% of squat    Time 6    Period Weeks    Status Partially Met    Target Date 12/27/20      Additional Long Term Goals   Additional Long Term Goals Yes      PT LONG TERM GOAL #6   Title Patient will perform the 6 minute walk test for >1000 ft with no episodes of hip hiking, vaulting, or antalgia for return to PLOF to be within functional norms for age.    Baseline 11/19: unable to perform 12/23: 1565 ft    Time 6    Period Weeks    Status Achieved      PT LONG TERM GOAL #7   Title Patient will tolerate 20 seconds of single leg stance without loss of balance to improve ability to get in and out of shower safely, return to gym activities, and return to PLOF.    Baseline 1/3: limited tolerance    Time 6    Period Weeks    Status New     Target Date 12/27/20      PT LONG TERM GOAL #8   Title Patient will perform the 6 minute walk test for >1800 ft with no episodes of hip hiking, vaulting, or antalgia for return to PLOF to be within functional norms for age.    Baseline 12/23: 1565 ft    Time 6    Period Weeks    Status New    Target Date 12/27/20                 Plan - 11/17/20 1823    Clinical Impression Statement Patient continues to demonstrate excellent progress with functional mobility and muscle activation. Progression of single limb stabilization interventions tolerated well however patient continues to require occasional finger tip support and/or reverts to internal rotation compensatory mechanisms as fatigued. Patient will benefit from skilled physical therapy to increase strength, alignment, and stabilization to RLE for return to PLOF    Personal Factors and Comorbidities Comorbidity 2;Past/Current Experience;Profession    Comorbidities three previous MPFL surgeries, anemia    Examination-Activity Limitations Bathing;Bed Mobility;Caring for Others;Carry;Stairs;Squat;Sleep;Sit;Locomotion Level;Lift;Stand;Transfers    Examination-Participation Restrictions Cleaning;Community Activity;Meal Prep;Laundry;Shop;Yard Work;Occupation    Stability/Clinical Decision Making Stable/Uncomplicated    Rehab Potential Good    PT Frequency 2x / week    PT Duration 6 weeks    PT Treatment/Interventions ADLs/Self Care Home Management;Aquatic Therapy;Cryotherapy;Electrical Stimulation;Biofeedback;Iontophoresis 73m/ml Dexamethasone;Moist Heat;Ultrasound;Functional mobility training;Stair training;Gait training;DME Instruction;Therapeutic activities;Therapeutic exercise;Balance training;Neuromuscular re-education;Manual techniques;Patient/family education;Compression bandaging;Scar mobilization;Passive range of motion;Dry needling;Taping;Splinting;Energy conservation;Orthotic Fit/Training    PT Next Visit Plan ROM, strength     PT Home Exercise Plan see above    Consulted and Agree with  Plan of Care Patient           Patient will benefit from skilled therapeutic intervention in order to improve the following deficits and impairments:  Abnormal gait,Decreased activity tolerance,Decreased endurance,Decreased mobility,Decreased range of motion,Difficulty walking,Decreased strength,Decreased scar mobility,Increased edema,Impaired flexibility,Impaired perceived functional ability,Improper body mechanics,Pain,Increased muscle spasms,Impaired sensation,Postural dysfunction  Visit Diagnosis: Stiffness of right knee, not elsewhere classified  Other abnormalities of gait and mobility  Muscle weakness (generalized)     Problem List Patient Active Problem List   Diagnosis Date Noted  . Vitamin B12 deficiency 12/18/2019  . Plant irritant contact dermatitis 06/17/2019  . Iron deficiency anemia 12/09/2018  . Preventative health care 12/04/2016  . Seasonal allergic rhinitis 11/27/2014   Janna Arch, PT, DPT   11/17/2020, 6:24 PM  [place] MAIN [facility] SERVICES [address] [place], [place], [address] Phone: [phone]   Fax:  [phone]  Name: Dawn Hamilton MRN: [id number] Date of Birth: [birth year]

## 2020-11-22 ENCOUNTER — Ambulatory Visit: Payer: 59

## 2020-11-24 ENCOUNTER — Other Ambulatory Visit: Payer: Self-pay

## 2020-11-24 ENCOUNTER — Ambulatory Visit: Payer: 59

## 2020-11-24 DIAGNOSIS — M6281 Muscle weakness (generalized): Secondary | ICD-10-CM | POA: Diagnosis not present

## 2020-11-24 DIAGNOSIS — M25661 Stiffness of right knee, not elsewhere classified: Secondary | ICD-10-CM

## 2020-11-24 DIAGNOSIS — R2689 Other abnormalities of gait and mobility: Secondary | ICD-10-CM

## 2020-11-24 NOTE — Therapy (Signed)
[place] MAIN [facility] SERVICES [address] [place], [place], [address] Phone: [phone]   Fax:  [phone]  Physical Therapy Treatment  Patient Details  Name: Dawn Hamilton MRN: [id number] Date of Birth: 20-May-1976 Referring Provider (PT): Hiram Gash   Encounter Date: 11/24/2020   PT End of Session - 11/24/20 0812    Visit Number 13    Number of Visits 23    Date for PT Re-Evaluation 12/27/20    Authorization Type 3/10 PN 11/03/20    PT Start Time 0840    PT Stop Time 0925    PT Time Calculation (min) 45 min    Activity Tolerance Patient tolerated treatment well    Behavior During Therapy [facility] for tasks assessed/performed           Past Medical History:  Diagnosis Date  . Allergy   . Anxiety   . Depression   . Precancerous skin lesion     Past Surgical History:  Procedure Laterality Date  . KNEE ARTHROSCOPY WITH MEDIAL COLLATERAL LIGAMENT RECONSTRUCTION Right 09/23/2020   Procedure: RIGHT KNEE ARTHROSCOPY DEBRIDMENT  WITH MEDIAL COLLATERAL LIGAMENT RECONSTRUCTION;  Surgeon: Hiram Gash, MD;  Location: [place];  Service: Orthopedics;  Laterality: Right;  . KNEE ARTHROSCOPY WITH MEDIAL PATELLAR FEMORAL LIGAMENT RECONSTRUCTION Right 10/02/2019   Procedure: KNEE ARTHROSCOPY WITH MEDIAL PATELLAR FEMORAL LIGAMENT RECONSTRUCTION WITH ALLOGRAPH;  Surgeon: Hiram Gash, MD;  Location: [place];  Service: Orthopedics;  Laterality: Right;  . KNEE SURGERY     x3    There were no vitals filed for this visit.   Subjective Assessment - 11/24/20 0811    Subjective Patient missed last session due to work conflict. Remains active and compliant with HEP and progressive strengthening.    Pertinent History Patient is a 45 year old female s/p Right MCL reconstruction with tibialis anterior allograft on 09/23/20. Patient had an additional MPFL on 09/2019 and has seen this therapist for therapy  afterwards; as well as additional MPFL surgery in 1996 and 2004. Last march she was having some pain and an MRI of the R knee showed positive MCl sprain but with no full thickness MCL injury. She was treated conservatively with J&J brace. She did not have any improvements and had worsening of sharp pains. 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    Limitations Sitting;Lifting;Standing;Walking;House hold activities    How long can you sit comfortably? requires J & J brace, uncomfortable for prolonged sitting    How long can you stand comfortably? WBAT with J &J brace    How long can you walk comfortably? today is first day WBAT with J& J brace    Patient Stated Goals to return to PLOF.    Currently in Pain? No/denies                 Treatment:    Stationary bike seat level 15: increasing/decreasing resistance ; forward, backwards for increasing arc of motion with focus on heel press rather than pushing through toes, control of eccentric and concentric portion of range   Treadmill backwards ambulation 1.3-1.6 rpm. 2 minutes, focus on neutral alignment and muscle contraction.     Small step: - lateral step up  10x , 2 sets; second set with weighted ball overhead raise - Forward step up/down 10x 2 sets; second set with overhead weighted ball.  -single leg step up into crane stance RLE   kettlebell 5lb: -RDL to squad with focus on foot placement 10x -modified hip hinge 10x  -weight in opp hand, single leg romanian deadlift for knee alignment 10x ; 2 sets  In PT gym:  Standing: 3 way hip strengthening with RTB around ankles 10x each direction   Supine:  Scar tissue massage x4 minutes      Pt educated throughout session about proper  posture and technique with exercises. Improved exercise technique, movement at target joints, use of target muscles after min to mod verbal, visual, tactile cues                         PT Education - 11/24/20 0812    Education Details exercise technique, body mechanics    Person(s) Educated Patient    Methods Explanation;Demonstration;Tactile cues;Verbal cues    Comprehension Verbalized understanding;Returned demonstration;Verbal cues required;Tactile cues required            PT Short Term Goals - 11/03/20 0923      PT SHORT TERM GOAL #1   Title Patient will be independent in home exercise program to improve strength/mobility for better functional independence with ADLs.    Baseline 11/19: HEP given 12/22: HEP compliant    Time 2    Period Weeks    Status Achieved    Target Date 10/15/20      PT SHORT TERM GOAL #2   Title Patient will demonstrate 90 degrees of flexion and neutral knee extension with active range of motion of R knee for improved gait mechanics and muscular activation/alignment    Baseline 11/19: 57 flexion -1 extension 12/22: 134 AROM flexion    Time 2    Period Weeks    Status Achieved    Target Date 10/15/20             PT Long Term Goals - 11/15/20 1236      PT LONG TERM GOAL #1   Title Patient will demonstrate >/=120 degrees of flexion and neutral extension with active range of motion of R knee  for improved gait mechanics and muscular activation/alignment and return to functional ROM    Baseline 11/19: 57 flexion, -1 extension 12/22: 134 active flexion    Time 6    Period Weeks    Status Achieved      PT LONG TERM GOAL #2   Title Patient will increase BLE gross strength to 4+/5 as to improve functional strength for independent gait, increased standing tolerance and increased ADL ability.    Baseline 11/19: see note 12/22: see note    Time 6    Period Weeks    Status Partially Met    Target Date 12/27/20      PT LONG TERM  GOAL #3   Title Patient will increase lower extremity functional scale to >60/80 to demonstrate improved functional mobility and increased tolerance with ADLs.    Baseline 11/19: 32/808 12/23: 57/80    Time 6    Period Weeks    Status Partially Met    Target Date 12/27/20      PT LONG TERM GOAL #4   Title Patient will increase FOTO score to equal to or greater than   67%  to demonstrate statistically significant improvement in mobility and  quality of life.    Baseline 11/19: 43% 12/23: 63%    Time 6    Period Weeks    Status Partially Met    Target Date 12/27/20      PT LONG TERM GOAL #5   Title Patient will perform a squat with equal weight shift and no LOB or increase of pain >1 degree on the VAS scale for functional activty performance and return to PLOF.    Baseline 11/19: unable 12/23: weight shift onto non surgical limb; 11/15/20: near full squat with no pain increase, slight loss of weight acceptance at final 15% of squat    Time 6    Period Weeks    Status Partially Met    Target Date 12/27/20      Additional Long Term Goals   Additional Long Term Goals Yes      PT LONG TERM GOAL #6   Title Patient will perform the 6 minute walk test for >1000 ft with no episodes of hip hiking, vaulting, or antalgia for return to PLOF to be within functional norms for age.    Baseline 11/19: unable to perform 12/23: 1565 ft    Time 6    Period Weeks    Status Achieved      PT LONG TERM GOAL #7   Title Patient will tolerate 20 seconds of single leg stance without loss of balance to improve ability to get in and out of shower safely, return to gym activities, and return to PLOF.    Baseline 1/3: limited tolerance    Time 6    Period Weeks    Status New    Target Date 12/27/20      PT LONG TERM GOAL #8   Title Patient will perform the 6 minute walk test for >1800 ft with no episodes of hip hiking, vaulting, or antalgia for return to PLOF to be within functional norms for age.     Baseline 12/23: 1565 ft    Time 6    Period Weeks    Status New    Target Date 12/27/20                 Plan - 11/24/20 1704    Clinical Impression Statement Patient continues to progress with functional strengthening and single limb stabilization interventions. Cueing for neutral alignment continues to be an area for focus with preference for internal rotation noted. Patient will benefit from skilled physical therapy to increase strength, alignment, and stabilization to RLE for return to PLOF. Patient will benefit from skilled physical therapy to increase strength, alignment, and stabilization to RLE for return to PLOF    Personal Factors and Comorbidities Comorbidity 2;Past/Current Experience;Profession    Comorbidities three previous MPFL surgeries, anemia    Examination-Activity Limitations Bathing;Bed Mobility;Caring for Others;Carry;Stairs;Squat;Sleep;Sit;Locomotion Level;Lift;Stand;Transfers    Examination-Participation Restrictions Cleaning;Community Activity;Meal Prep;Laundry;Shop;Yard Work;Occupation    Stability/Clinical Decision Making Stable/Uncomplicated    Rehab Potential Good    PT Frequency 2x / week    PT Duration 6 weeks    PT Treatment/Interventions ADLs/Self Care Home Management;Aquatic Therapy;Cryotherapy;Electrical Stimulation;Biofeedback;Iontophoresis 50m/ml Dexamethasone;Moist Heat;Ultrasound;Functional mobility training;Stair training;Gait training;DME Instruction;Therapeutic activities;Therapeutic exercise;Balance training;Neuromuscular re-education;Manual techniques;Patient/family education;Compression bandaging;Scar mobilization;Passive range of motion;Dry needling;Taping;Splinting;Energy conservation;Orthotic Fit/Training    PT Next Visit Plan ROM, strength    PT Home Exercise Plan see above    Consulted and Agree with Plan of Care Patient           Patient will benefit from skilled therapeutic intervention in  order to improve the following deficits and  impairments:  Abnormal gait,Decreased activity tolerance,Decreased endurance,Decreased mobility,Decreased range of motion,Difficulty walking,Decreased strength,Decreased scar mobility,Increased edema,Impaired flexibility,Impaired perceived functional ability,Improper body mechanics,Pain,Increased muscle spasms,Impaired sensation,Postural dysfunction  Visit Diagnosis: Stiffness of right knee, not elsewhere classified  Other abnormalities of gait and mobility  Muscle weakness (generalized)     Problem List Patient Active Problem List   Diagnosis Date Noted  . Vitamin B12 deficiency 12/18/2019  . Plant irritant contact dermatitis 06/17/2019  . Iron deficiency anemia 12/09/2018  . Preventative health care 12/04/2016  . Seasonal allergic rhinitis 11/27/2014   Janna Arch, PT, DPT    11/24/2020, 5:05 PM  [place] MAIN [facility] SERVICES [address] [place], [place], [address] Phone: [phone]   Fax:  [phone]  Name: Dawn Hamilton MRN: [id number] Date of Birth: [birth year]

## 2020-11-29 ENCOUNTER — Ambulatory Visit: Payer: 59

## 2020-12-01 ENCOUNTER — Other Ambulatory Visit: Payer: Self-pay

## 2020-12-01 ENCOUNTER — Ambulatory Visit: Payer: 59

## 2020-12-01 DIAGNOSIS — M6281 Muscle weakness (generalized): Secondary | ICD-10-CM | POA: Diagnosis not present

## 2020-12-01 DIAGNOSIS — M25661 Stiffness of right knee, not elsewhere classified: Secondary | ICD-10-CM | POA: Diagnosis not present

## 2020-12-01 DIAGNOSIS — R2689 Other abnormalities of gait and mobility: Secondary | ICD-10-CM

## 2020-12-01 NOTE — Therapy (Signed)
[place] MAIN [facility] SERVICES [address] [place], [place], [address] Phone: [phone]   Fax:  [phone]  Physical Therapy Treatment  Patient Details  Name: Dawn Hamilton MRN: [id number] Date of Birth: 09-13-1976 Referring Provider (PT): Hiram Gash   Encounter Date: 12/01/2020   PT End of Session - 12/01/20 1229    Visit Number 14    Number of Visits 23    Date for PT Re-Evaluation 12/27/20    Authorization Type 4/10 PN 11/03/20    PT Start Time 0758    PT Stop Time 0841    PT Time Calculation (min) 43 min    Activity Tolerance Patient tolerated treatment well    Behavior During Therapy [facility] for tasks assessed/performed           Past Medical History:  Diagnosis Date  . Allergy   . Anxiety   . Depression   . Precancerous skin lesion     Past Surgical History:  Procedure Laterality Date  . KNEE ARTHROSCOPY WITH MEDIAL COLLATERAL LIGAMENT RECONSTRUCTION Right 09/23/2020   Procedure: RIGHT KNEE ARTHROSCOPY DEBRIDMENT  WITH MEDIAL COLLATERAL LIGAMENT RECONSTRUCTION;  Surgeon: Hiram Gash, MD;  Location: [place];  Service: Orthopedics;  Laterality: Right;  . KNEE ARTHROSCOPY WITH MEDIAL PATELLAR FEMORAL LIGAMENT RECONSTRUCTION Right 10/02/2019   Procedure: KNEE ARTHROSCOPY WITH MEDIAL PATELLAR FEMORAL LIGAMENT RECONSTRUCTION WITH ALLOGRAPH;  Surgeon: Hiram Gash, MD;  Location: [place];  Service: Orthopedics;  Laterality: Right;  . KNEE SURGERY     x3    There were no vitals filed for this visit.   Subjective Assessment - 12/01/20 1227    Subjective Patient has been pushing a cart to help due to COVID influx at work. Has been standing for prolonged periods of time. Only session this week due to closure from snow. No falls, has been careful on the ice.    Pertinent History Patient is a 44 year old female s/p Right MCL reconstruction with tibialis anterior allograft on 09/23/20.  Patient had an additional MPFL on 09/2019 and has seen this therapist for therapy afterwards; as well as additional MPFL surgery in 1996 and 2004. Last march she was having some pain and an MRI of the R knee showed positive MCl sprain but with no full thickness MCL injury. She was treated conservatively with J&J brace. She did not have any improvements and had worsening of sharp pains. 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    Limitations Sitting;Lifting;Standing;Walking;House hold activities    How long can you sit comfortably? requires J & J brace, uncomfortable for prolonged sitting    How long can you stand comfortably? WBAT with J &J brace    How long can you walk comfortably? today is first day WBAT with J& J brace    Patient Stated Goals to return to PLOF.    Currently in Pain? No/denies                 Treatment: In other gym: Treadmill: backwards ambulation 1.2-1.6 mph cues for heel toe off and weight shift 5 minutes  Dynamic mobility with visual and verbal cueing: -standing hamstring stretch fingers to toes 30 second hold -walk into plank then into  downward dog 30 second hold -supine butterfly stretch 60 seconds   Modified single leg stand on wall: 60 second hold RLE for stabilization in neutral alignment  Orange TB around toe: lateral modified squat stepping 10 ft x 6; very fatiguing  Slider under foot: modified lateral squat with cue for neutral alignment 10x (pain in anterior aspect of shin) attempt AP talocrural joint mobs 5x 10 second holds without success.   In PT gym: Prone:  Towel under distal aspect of knee: optimal lengthening of hip flexor 3x30 second holds froggers 15x  Supine: Hamstring lengthening  with leg on PT shoulder 60 second hold Hamstring lengthening with calf overpressure 60 seconds STM/scar tissue massage of surgical site: addressed limited musculature anterior calf musculature. X 8 minutes  Patient educated on proper muscle recruitment and stabilization for neutral alignment.                       PT Education - 12/01/20 1228    Education Details maintaining neutral LE alignment    Person(s) Educated Patient    Methods Explanation;Demonstration;Tactile cues;Verbal cues    Comprehension Verbalized understanding;Returned demonstration;Verbal cues required;Tactile cues required            PT Short Term Goals - 11/03/20 0923      PT SHORT TERM GOAL #1   Title Patient will be independent in home exercise program to improve strength/mobility for better functional independence with ADLs.    Baseline 11/19: HEP given 12/22: HEP compliant    Time 2    Period Weeks    Status Achieved    Target Date 10/15/20      PT SHORT TERM GOAL #2   Title Patient will demonstrate 90 degrees of flexion and neutral knee extension with active range of motion of R knee for improved gait mechanics and muscular activation/alignment    Baseline 11/19: 57 flexion -1 extension 12/22: 134 AROM flexion    Time 2    Period Weeks    Status Achieved    Target Date 10/15/20             PT Long Term Goals - 11/15/20 1236      PT LONG TERM GOAL #1   Title Patient will demonstrate >/=120 degrees of flexion and neutral extension with active range of motion of R knee  for improved gait mechanics and muscular activation/alignment and return to functional ROM    Baseline 11/19: 57 flexion, -1 extension 12/22: 134 active flexion    Time 6    Period Weeks    Status Achieved      PT LONG TERM GOAL #2   Title Patient will increase BLE gross strength to 4+/5 as to improve functional strength for independent gait, increased standing tolerance and increased ADL ability.    Baseline  11/19: see note 12/22: see note    Time 6    Period Weeks    Status Partially Met    Target Date 12/27/20      PT LONG TERM GOAL #3   Title Patient will increase lower extremity functional scale to >60/80 to demonstrate improved functional mobility and increased tolerance with ADLs.    Baseline 11/19: 32/808 12/23: 57/80    Time 6    Period Weeks    Status Partially Met    Target Date 12/27/20      PT LONG TERM GOAL #4   Title Patient will increase FOTO score to equal to or greater than  67%  to demonstrate statistically significant improvement in mobility and quality of life.    Baseline 11/19: 43% 12/23: 63%    Time 6    Period Weeks    Status Partially Met    Target Date 12/27/20      PT LONG TERM GOAL #5   Title Patient will perform a squat with equal weight shift and no LOB or increase of pain >1 degree on the VAS scale for functional activty performance and return to PLOF.    Baseline 11/19: unable 12/23: weight shift onto non surgical limb; 11/15/20: near full squat with no pain increase, slight loss of weight acceptance at final 15% of squat    Time 6    Period Weeks    Status Partially Met    Target Date 12/27/20      Additional Long Term Goals   Additional Long Term Goals Yes      PT LONG TERM GOAL #6   Title Patient will perform the 6 minute walk test for >1000 ft with no episodes of hip hiking, vaulting, or antalgia for return to PLOF to be within functional norms for age.    Baseline 11/19: unable to perform 12/23: 1565 ft    Time 6    Period Weeks    Status Achieved      PT LONG TERM GOAL #7   Title Patient will tolerate 20 seconds of single leg stance without loss of balance to improve ability to get in and out of shower safely, return to gym activities, and return to PLOF.    Baseline 1/3: limited tolerance    Time 6    Period Weeks    Status New    Target Date 12/27/20      PT LONG TERM GOAL #8   Title Patient will perform the 6 minute walk test for  >1800 ft with no episodes of hip hiking, vaulting, or antalgia for return to PLOF to be within functional norms for age.    Baseline 12/23: 1565 ft    Time 6    Period Weeks    Status New    Target Date 12/27/20                 Plan - 12/01/20 1238    Clinical Impression Statement Patient is fatigued this session due to increased work demands so session today focused on neutral alignment and body mechanics. Gluteal activation and external rotation musculature continue to require focused strengthening due to preference for internal rotation.Patient will benefit from skilled physical therapy to increase strength, alignment, and stabilization to RLE for return to PLOF    Personal Factors and Comorbidities Comorbidity 2;Past/Current Experience;Profession    Comorbidities three previous MPFL surgeries, anemia    Examination-Activity Limitations Bathing;Bed Mobility;Caring for Others;Carry;Stairs;Squat;Sleep;Sit;Locomotion Level;Lift;Stand;Transfers    Examination-Participation Restrictions Cleaning;Community Activity;Meal Prep;Laundry;Shop;Yard Work;Occupation    Stability/Clinical Decision Making Stable/Uncomplicated    Rehab Potential Good    PT Frequency 2x / week    PT Duration 6 weeks    PT Treatment/Interventions ADLs/Self Care Home Management;Aquatic Therapy;Cryotherapy;Electrical Stimulation;Biofeedback;Iontophoresis 4mg /ml Dexamethasone;Moist Heat;Ultrasound;Functional mobility training;Stair training;Gait training;DME Instruction;Therapeutic activities;Therapeutic exercise;Balance training;Neuromuscular re-education;Manual techniques;Patient/family education;Compression bandaging;Scar mobilization;Passive range of motion;Dry needling;Taping;Splinting;Energy conservation;Orthotic Fit/Training    PT Next Visit Plan ROM, strength    PT Home Exercise Plan see above    Consulted and Agree with Plan of Care Patient           Patient will benefit from skilled therapeutic intervention  in order  to improve the following deficits and impairments:  Abnormal gait,Decreased activity tolerance,Decreased endurance,Decreased mobility,Decreased range of motion,Difficulty walking,Decreased strength,Decreased scar mobility,Increased edema,Impaired flexibility,Impaired perceived functional ability,Improper body mechanics,Pain,Increased muscle spasms,Impaired sensation,Postural dysfunction  Visit Diagnosis: Stiffness of right knee, not elsewhere classified  Other abnormalities of gait and mobility  Muscle weakness (generalized)     Problem List Patient Active Problem List   Diagnosis Date Noted  . Vitamin B12 deficiency 12/18/2019  . Plant irritant contact dermatitis 06/17/2019  . Iron deficiency anemia 12/09/2018  . Preventative health care 12/04/2016  . Seasonal allergic rhinitis 11/27/2014   Janna Arch, PT, DPT   12/01/2020, 12:39 PM  [place] MAIN [facility] SERVICES [address] [place], [place], [address] Phone: [phone]   Fax:  [phone]  Name: Jazmyn Offner MRN: [id number] Date of Birth: [birth year]

## 2020-12-06 ENCOUNTER — Ambulatory Visit: Payer: 59

## 2020-12-08 ENCOUNTER — Other Ambulatory Visit: Payer: Self-pay

## 2020-12-08 ENCOUNTER — Ambulatory Visit: Payer: 59

## 2020-12-08 DIAGNOSIS — R2689 Other abnormalities of gait and mobility: Secondary | ICD-10-CM

## 2020-12-08 DIAGNOSIS — M25661 Stiffness of right knee, not elsewhere classified: Secondary | ICD-10-CM | POA: Diagnosis not present

## 2020-12-08 DIAGNOSIS — M6281 Muscle weakness (generalized): Secondary | ICD-10-CM | POA: Diagnosis not present

## 2020-12-08 NOTE — Therapy (Signed)
[place] MAIN [facility] SERVICES [address] [place], [place], [address] Phone: [phone]   Fax:  [phone]  Physical Therapy Treatment  Patient Details  Name: Dawn Hamilton MRN: [id number] Date of Birth: 02-10-1976 Referring Provider (PT): Hiram Gash   Encounter Date: 12/08/2020   PT End of Session - 12/08/20 1341    Visit Number 15    Number of Visits 23    Date for PT Re-Evaluation 12/27/20    Authorization Type 5/10 PN 11/03/20    PT Start Time 1020    PT Stop Time 1106    PT Time Calculation (min) 46 min    Activity Tolerance Patient tolerated treatment well    Behavior During Therapy [facility] for tasks assessed/performed           Past Medical History:  Diagnosis Date  . Allergy   . Anxiety   . Depression   . Precancerous skin lesion     Past Surgical History:  Procedure Laterality Date  . KNEE ARTHROSCOPY WITH MEDIAL COLLATERAL LIGAMENT RECONSTRUCTION Right 09/23/2020   Procedure: RIGHT KNEE ARTHROSCOPY DEBRIDMENT  WITH MEDIAL COLLATERAL LIGAMENT RECONSTRUCTION;  Surgeon: Hiram Gash, MD;  Location: [place];  Service: Orthopedics;  Laterality: Right;  . KNEE ARTHROSCOPY WITH MEDIAL PATELLAR FEMORAL LIGAMENT RECONSTRUCTION Right 10/02/2019   Procedure: KNEE ARTHROSCOPY WITH MEDIAL PATELLAR FEMORAL LIGAMENT RECONSTRUCTION WITH ALLOGRAPH;  Surgeon: Hiram Gash, MD;  Location: [place];  Service: Orthopedics;  Laterality: Right;  . KNEE SURGERY     x3    There were no vitals filed for this visit.   Subjective Assessment - 12/08/20 1339    Subjective Patient has been compliant with her HEP. No falls or LOB since last session. Has been very active    Pertinent History Patient is a 45 year old female s/p Right MCL reconstruction with tibialis anterior allograft on 09/23/20. Patient had an additional MPFL on 09/2019 and has seen this therapist for therapy afterwards; as well as  additional MPFL surgery in 1996 and 2004. Last march she was having some pain and an MRI of the R knee showed positive MCl sprain but with no full thickness MCL injury. She was treated conservatively with J&J brace. She did not have any improvements and had worsening of sharp pains. 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    Limitations Sitting;Lifting;Standing;Walking;House hold activities    How long can you sit comfortably? requires J & J brace, uncomfortable for prolonged sitting    How long can you stand comfortably? WBAT with J &J brace    How long can you walk comfortably? today is first day WBAT with J& J brace    Patient Stated Goals to return to PLOF.    Currently in Pain? No/denies                    Treadmill backwards ambulation 1.3-1.6 rpm. 4 minutes, focus on neutral alignment and muscle contraction.     Superset:  Exercise 1: Orange TB around toe: lateral modified squat stepping 10 ft  Exercise 2: orange TB around toes, forward lunge step 8x each LE Exercise 3: squat 10x modified  Slider under foot: modified lateral squat with cue  for neutral alignment 10x (pain in anterior aspect of shin) , 10x posterior lunge slide  Supine: Single limb bridge RLE non modified 10x, non modified LLE 10x  Standing to plank walk outs 10x Quadruped: alternating hip extension 10x each LE   Seated on bench: LAQ holding 5lb dumbell between feet 10x ; 2 sets  Leg press: RLE , #3 plate, low foot hold 10x, 2 sets, high foot hold 10x 2 sets (40 reps total)  In PT gym:  Prone:  froggers 10x froggers with 3 second pulse 10x Towel under distal aspect of knee: optimal lengthening of hip flexor 3x30 second holds   Supine:  LE  rotation 12x each side       Pt educated throughout session about proper posture and technique with exercises. Improved exercise technique, movement at target joints, use of target muscles after min to mod verbal, visual, tactile cues                      PT Education - 12/08/20 1340    Education Details exercise technique, body mechanics    Person(s) Educated Patient    Methods Explanation;Demonstration;Tactile cues;Verbal cues    Comprehension Verbalized understanding;Returned demonstration;Verbal cues required;Tactile cues required            PT Short Term Goals - 11/03/20 0923      PT SHORT TERM GOAL #1   Title Patient will be independent in home exercise program to improve strength/mobility for better functional independence with ADLs.    Baseline 11/19: HEP given 12/22: HEP compliant    Time 2    Period Weeks    Status Achieved    Target Date 10/15/20      PT SHORT TERM GOAL #2   Title Patient will demonstrate 90 degrees of flexion and neutral knee extension with active range of motion of R knee for improved gait mechanics and muscular activation/alignment    Baseline 11/19: 57 flexion -1 extension 12/22: 134 AROM flexion    Time 2    Period Weeks    Status Achieved    Target Date 10/15/20             PT Long Term Goals - 11/15/20 1236      PT LONG TERM GOAL #1   Title Patient will demonstrate >/=120 degrees of flexion and neutral extension with active range of motion of R knee  for improved gait mechanics and muscular activation/alignment and return to functional ROM    Baseline 11/19: 57 flexion, -1 extension 12/22: 134 active flexion    Time 6    Period Weeks    Status Achieved      PT LONG TERM GOAL #2   Title Patient will increase BLE gross strength to 4+/5 as to improve functional strength for independent gait, increased standing tolerance and increased ADL ability.    Baseline 11/19: see note 12/22: see note    Time 6    Period Weeks     Status Partially Met    Target Date 12/27/20      PT LONG TERM GOAL #3   Title Patient will increase lower extremity functional scale to >60/80 to demonstrate improved functional mobility and increased tolerance with ADLs.    Baseline 11/19: 32/808 12/23: 57/80    Time 6    Period Weeks    Status Partially Met    Target Date 12/27/20      PT LONG TERM GOAL #4   Title  Patient will increase FOTO score to equal to or greater than   67%  to demonstrate statistically significant improvement in mobility and quality of life.    Baseline 11/19: 43% 12/23: 63%    Time 6    Period Weeks    Status Partially Met    Target Date 12/27/20      PT LONG TERM GOAL #5   Title Patient will perform a squat with equal weight shift and no LOB or increase of pain >1 degree on the VAS scale for functional activty performance and return to PLOF.    Baseline 11/19: unable 12/23: weight shift onto non surgical limb; 11/15/20: near full squat with no pain increase, slight loss of weight acceptance at final 15% of squat    Time 6    Period Weeks    Status Partially Met    Target Date 12/27/20      Additional Long Term Goals   Additional Long Term Goals Yes      PT LONG TERM GOAL #6   Title Patient will perform the 6 minute walk test for >1000 ft with no episodes of hip hiking, vaulting, or antalgia for return to PLOF to be within functional norms for age.    Baseline 11/19: unable to perform 12/23: 1565 ft    Time 6    Period Weeks    Status Achieved      PT LONG TERM GOAL #7   Title Patient will tolerate 20 seconds of single leg stance without loss of balance to improve ability to get in and out of shower safely, return to gym activities, and return to PLOF.    Baseline 1/3: limited tolerance    Time 6    Period Weeks    Status New    Target Date 12/27/20      PT LONG TERM GOAL #8   Title Patient will perform the 6 minute walk test for >1800 ft with no episodes of hip hiking, vaulting, or antalgia  for return to PLOF to be within functional norms for age.    Baseline 12/23: 1565 ft    Time 6    Period Weeks    Status New    Target Date 12/27/20                 Plan - 12/08/20 1342    Clinical Impression Statement Patient requires less frequent cues for neutral alignment of limb with less frequent episodes of internal rotation. Her quad activation is additionally improving with noticeable muscle contraction and return to near bilateral symmetry. Patient will benefit from skilled physical therapy to increase strength, alignment, and stabilization to RLE for return to PLOF    Personal Factors and Comorbidities Comorbidity 2;Past/Current Experience;Profession    Comorbidities three previous MPFL surgeries, anemia    Examination-Activity Limitations Bathing;Bed Mobility;Caring for Others;Carry;Stairs;Squat;Sleep;Sit;Locomotion Level;Lift;Stand;Transfers    Examination-Participation Restrictions Cleaning;Community Activity;Meal Prep;Laundry;Shop;Yard Work;Occupation    Stability/Clinical Decision Making Stable/Uncomplicated    Rehab Potential Good    PT Frequency 2x / week    PT Duration 6 weeks    PT Treatment/Interventions ADLs/Self Care Home Management;Aquatic Therapy;Cryotherapy;Electrical Stimulation;Biofeedback;Iontophoresis 4mg /ml Dexamethasone;Moist Heat;Ultrasound;Functional mobility training;Stair training;Gait training;DME Instruction;Therapeutic activities;Therapeutic exercise;Balance training;Neuromuscular re-education;Manual techniques;Patient/family education;Compression bandaging;Scar mobilization;Passive range of motion;Dry needling;Taping;Splinting;Energy conservation;Orthotic Fit/Training    PT Next Visit Plan ROM, strength    PT Home Exercise Plan see above    Consulted and Agree with Plan of Care Patient  Patient will benefit from skilled therapeutic intervention in order to improve the following deficits and impairments:  Abnormal gait,Decreased  activity tolerance,Decreased endurance,Decreased mobility,Decreased range of motion,Difficulty walking,Decreased strength,Decreased scar mobility,Increased edema,Impaired flexibility,Impaired perceived functional ability,Improper body mechanics,Pain,Increased muscle spasms,Impaired sensation,Postural dysfunction  Visit Diagnosis: Stiffness of right knee, not elsewhere classified  Other abnormalities of gait and mobility  Muscle weakness (generalized)     Problem List Patient Active Problem List   Diagnosis Date Noted  . Vitamin B12 deficiency 12/18/2019  . Plant irritant contact dermatitis 06/17/2019  . Iron deficiency anemia 12/09/2018  . Preventative health care 12/04/2016  . Seasonal allergic rhinitis 11/27/2014   Janna Arch, PT, DPT   12/08/2020, 1:43 PM  [place] MAIN [facility] SERVICES [address] [place], [place], [address] Phone: [phone]   Fax:  [phone]  Name: Zafira Munos MRN: [id number] Date of Birth: 1976-01-22

## 2020-12-13 ENCOUNTER — Ambulatory Visit: Payer: 59

## 2020-12-15 ENCOUNTER — Other Ambulatory Visit: Payer: Self-pay

## 2020-12-15 ENCOUNTER — Ambulatory Visit: Payer: 59 | Attending: Orthopaedic Surgery

## 2020-12-15 DIAGNOSIS — R2689 Other abnormalities of gait and mobility: Secondary | ICD-10-CM | POA: Diagnosis not present

## 2020-12-15 DIAGNOSIS — M6281 Muscle weakness (generalized): Secondary | ICD-10-CM | POA: Insufficient documentation

## 2020-12-15 DIAGNOSIS — M25661 Stiffness of right knee, not elsewhere classified: Secondary | ICD-10-CM | POA: Diagnosis not present

## 2020-12-15 NOTE — Therapy (Signed)
[facility] MAIN [facility] SERVICES [address] [place], [place], [address] Phone: [phone]   Fax:  [phone]  Physical Therapy Treatment  Patient Details  Name: Jenette Rayson MRN: [id number] Date of Birth: June 17, 1976 Referring Provider (PT): Bjorn Pippin   Encounter Date: 12/15/2020   PT End of Session - 12/15/20 1238    Visit Number 16    Number of Visits 23    Date for PT Re-Evaluation 12/27/20    Authorization Type 6/10 PN 11/03/20    PT Start Time 0842    PT Stop Time 0926    PT Time Calculation (min) 44 min    Activity Tolerance Patient tolerated treatment well    Behavior During Therapy [facility] for tasks assessed/performed           Past Medical History:  Diagnosis Date  . Allergy   . Anxiety   . Depression   . Precancerous skin lesion     Past Surgical History:  Procedure Laterality Date  . KNEE ARTHROSCOPY WITH MEDIAL COLLATERAL LIGAMENT RECONSTRUCTION Right 09/23/2020   Procedure: RIGHT KNEE ARTHROSCOPY DEBRIDMENT  WITH MEDIAL COLLATERAL LIGAMENT RECONSTRUCTION;  Surgeon: Bjorn Pippin, MD;  Location: [facility];  Service: Orthopedics;  Laterality: Right;  . KNEE ARTHROSCOPY WITH MEDIAL PATELLAR FEMORAL LIGAMENT RECONSTRUCTION Right 10/02/2019   Procedure: KNEE ARTHROSCOPY WITH MEDIAL PATELLAR FEMORAL LIGAMENT RECONSTRUCTION WITH ALLOGRAPH;  Surgeon: Bjorn Pippin, MD;  Location: [facility];  Service: Orthopedics;  Laterality: Right;  . KNEE SURGERY     x3    There were no vitals filed for this visit.   Subjective Assessment - 12/15/20 1236    Subjective Patient missed last session due to work conflict. Has been compliant with HEP. No falls or increase of pain.    Pertinent History Patient is a 45 year old female s/p Right MCL reconstruction with tibialis anterior allograft on 09/23/20. Patient had an additional MPFL on 09/2019 and has seen this therapist for therapy afterwards; as well  as additional MPFL surgery in 1996 and 2004. Last march she was having some pain and an MRI of the R knee showed positive MCl sprain but with no full thickness MCL injury. She was treated conservatively with J&J brace. She did not have any improvements and had worsening of sharp pains. 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    Limitations Sitting;Lifting;Standing;Walking;House hold activities    How long can you sit comfortably? requires J & J brace, uncomfortable for prolonged sitting    How long can you stand comfortably? WBAT with J &J brace    How long can you walk comfortably? today is first day WBAT with J& J brace    Patient Stated Goals to return to PLOF.    Currently in Pain? No/denies                 Treadmill backwards ambulation 1.3-1.6 rpm. 4 minutes, focus on neutral alignment and muscle contraction.    5lb weighted ball -single limb stance chest press 10x -single limb stance rotation 10x  -ball pass with PT for pertubations in single limb stance 10x, 2 sets  Orange TB around knees: -squat to chair 20x, 2 sets   Squat to stand  to tip toes back to squat with overhead reach in front of wall 12x   Leg extension machine:  -bilateral concentric unilateral eccentric #4 plate 10x 2 sets -three way single leg (10x each direction) #1 plate, 2 sets   10lb dumbell each hand heel raises 15x      Hooklying in PT gym: Roller to R calf 3 minutes Roller to R quad and ilipsoas musculature for tissue tension reduction x 3 minutes      Pt educated throughout session about proper posture and technique with exercises. Improved exercise technique, movement at target joints, use of target muscles after min to mod verbal, visual,  tactile cues  Patient is progressing with functional single limb stability as well as quadriceps activation. She is able to intrinsically control knee position with decreased need for external cueing for position. Upon meeting with surgeon Friday will potentially be cleared to return to running. Patient will benefit from skilled physical therapy to increase strength, alignment, and stabilization to RLE for return to [facility]               PT Education - 12/15/20 1237    Education Details exercise technique, single limb stability    Person(s) Educated Patient    Methods Explanation;Demonstration;Tactile cues;Verbal cues    Comprehension Verbalized understanding;Returned demonstration;Verbal cues required;Tactile cues required            PT Short Term Goals - 11/03/20 0923      PT SHORT TERM GOAL #1   Title Patient will be independent in home exercise program to improve strength/mobility for better functional independence with ADLs.    Baseline 11/19: HEP given 12/22: HEP compliant    Time 2    Period Weeks    Status Achieved    Target Date 10/15/20      PT SHORT TERM GOAL #2   Title Patient will demonstrate 90 degrees of flexion and neutral knee extension with active range of motion of R knee for improved gait mechanics and muscular activation/alignment    Baseline 11/19: 57 flexion -1 extension 12/22: 134 AROM flexion    Time 2    Period Weeks    Status Achieved    Target Date 10/15/20             PT Long Term Goals - 11/15/20 1236      PT LONG TERM GOAL #1   Title Patient will demonstrate >/=120 degrees of flexion and neutral extension with active range of motion of R knee  for improved gait mechanics and muscular activation/alignment and return to functional ROM    Baseline 11/19: 57 flexion, -1 extension 12/22: 134 active flexion    Time 6    Period Weeks    Status Achieved      PT LONG TERM GOAL #2   Title Patient will increase BLE gross strength to 4+/5 as  to improve functional strength for independent gait, increased standing tolerance and increased ADL ability.    Baseline 11/19: see note 12/22: see note    Time 6    Period Weeks    Status Partially Met    Target Date 12/27/20      PT LONG TERM GOAL #3   Title Patient will increase lower extremity functional scale to >60/80 to demonstrate improved functional mobility and increased tolerance with ADLs.    Baseline 11/19: 32/808 12/23: 57/80    Time 6    Period Weeks    Status Partially Met  Target Date 12/27/20      PT LONG TERM GOAL #4   Title Patient will increase FOTO score to equal to or greater than   67%  to demonstrate statistically significant improvement in mobility and quality of life.    Baseline 11/19: 43% 12/23: 63%    Time 6    Period Weeks    Status Partially Met    Target Date 12/27/20      PT LONG TERM GOAL #5   Title Patient will perform a squat with equal weight shift and no LOB or increase of pain >1 degree on the VAS scale for functional activty performance and return to PLOF.    Baseline 11/19: unable 12/23: weight shift onto non surgical limb; 11/15/20: near full squat with no pain increase, slight loss of weight acceptance at final 15% of squat    Time 6    Period Weeks    Status Partially Met    Target Date 12/27/20      Additional Long Term Goals   Additional Long Term Goals Yes      PT LONG TERM GOAL #6   Title Patient will perform the 6 minute walk test for >1000 ft with no episodes of hip hiking, vaulting, or antalgia for return to PLOF to be within functional norms for age.    Baseline 11/19: unable to perform 12/23: 1565 ft    Time 6    Period Weeks    Status Achieved      PT LONG TERM GOAL #7   Title Patient will tolerate 20 seconds of single leg stance without loss of balance to improve ability to get in and out of shower safely, return to gym activities, and return to PLOF.    Baseline 1/3: limited tolerance    Time 6    Period Weeks     Status New    Target Date 12/27/20      PT LONG TERM GOAL #8   Title Patient will perform the 6 minute walk test for >1800 ft with no episodes of hip hiking, vaulting, or antalgia for return to PLOF to be within functional norms for age.    Baseline 12/23: 1565 ft    Time 6    Period Weeks    Status New    Target Date 12/27/20                 Plan - 12/15/20 1248    Clinical Impression Statement Patient is progressing with functional single limb stability as well as quadriceps activation. She is able to intrinsically control knee position with decreased need for external cueing for position. Upon meeting with surgeon Friday will potentially be cleared to return to running. Patient will benefit from skilled physical therapy to increase strength, alignment, and stabilization to RLE for return to PLOF    Personal Factors and Comorbidities Comorbidity 2;Past/Current Experience;Profession    Comorbidities three previous MPFL surgeries, anemia    Examination-Activity Limitations Bathing;Bed Mobility;Caring for Others;Carry;Stairs;Squat;Sleep;Sit;Locomotion Level;Lift;Stand;Transfers    Examination-Participation Restrictions Cleaning;Community Activity;Meal Prep;Laundry;Shop;Yard Work;Occupation    Stability/Clinical Decision Making Stable/Uncomplicated    Rehab Potential Good    PT Frequency 2x / week    PT Duration 6 weeks    PT Treatment/Interventions ADLs/Self Care Home Management;Aquatic Therapy;Cryotherapy;Electrical Stimulation;Biofeedback;Iontophoresis 4mg /ml Dexamethasone;Moist Heat;Ultrasound;Functional mobility training;Stair training;Gait training;DME Instruction;Therapeutic activities;Therapeutic exercise;Balance training;Neuromuscular re-education;Manual techniques;Patient/family education;Compression bandaging;Scar mobilization;Passive range of motion;Dry needling;Taping;Splinting;Energy conservation;Orthotic Fit/Training    PT Next Visit Plan ROM, strength    PT Home  Exercise  Plan see above    Consulted and Agree with Plan of Care Patient           Patient will benefit from skilled therapeutic intervention in order to improve the following deficits and impairments:  Abnormal gait,Decreased activity tolerance,Decreased endurance,Decreased mobility,Decreased range of motion,Difficulty walking,Decreased strength,Decreased scar mobility,Increased edema,Impaired flexibility,Impaired perceived functional ability,Improper body mechanics,Pain,Increased muscle spasms,Impaired sensation,Postural dysfunction  Visit Diagnosis: Stiffness of right knee, not elsewhere classified  Other abnormalities of gait and mobility  Muscle weakness (generalized)     Problem List Patient Active Problem List   Diagnosis Date Noted  . Vitamin B12 deficiency 12/18/2019  . Plant irritant contact dermatitis 06/17/2019  . Iron deficiency anemia 12/09/2018  . Preventative health care 12/04/2016  . Seasonal allergic rhinitis 11/27/2014   Janna Arch, PT, DPT   12/15/2020, 12:49 PM  [place] MAIN [facility] SERVICES [address] [place], [place], [address] Phone: [phone]   Fax:  [phone]  Name: Rakiya Krawczyk MRN: [id number] Date of Birth: 05-18-1976

## 2020-12-16 ENCOUNTER — Other Ambulatory Visit: Payer: Self-pay | Admitting: Primary Care

## 2020-12-16 DIAGNOSIS — Z0000 Encounter for general adult medical examination without abnormal findings: Secondary | ICD-10-CM

## 2020-12-16 DIAGNOSIS — E538 Deficiency of other specified B group vitamins: Secondary | ICD-10-CM

## 2020-12-16 DIAGNOSIS — D509 Iron deficiency anemia, unspecified: Secondary | ICD-10-CM

## 2020-12-17 ENCOUNTER — Other Ambulatory Visit: Payer: Self-pay

## 2020-12-17 ENCOUNTER — Other Ambulatory Visit (INDEPENDENT_AMBULATORY_CARE_PROVIDER_SITE_OTHER): Payer: 59

## 2020-12-17 DIAGNOSIS — D509 Iron deficiency anemia, unspecified: Secondary | ICD-10-CM

## 2020-12-17 DIAGNOSIS — Z0000 Encounter for general adult medical examination without abnormal findings: Secondary | ICD-10-CM | POA: Diagnosis not present

## 2020-12-17 DIAGNOSIS — M2351 Chronic instability of knee, right knee: Secondary | ICD-10-CM | POA: Diagnosis not present

## 2020-12-17 DIAGNOSIS — E538 Deficiency of other specified B group vitamins: Secondary | ICD-10-CM | POA: Diagnosis not present

## 2020-12-17 LAB — COMPREHENSIVE METABOLIC PANEL
ALT: 10 U/L (ref 0–35)
AST: 16 U/L (ref 0–37)
Albumin: 4.1 g/dL (ref 3.5–5.2)
Alkaline Phosphatase: 86 U/L (ref 39–117)
BUN: 17 mg/dL (ref 6–23)
CO2: 29 mEq/L (ref 19–32)
Calcium: 9.4 mg/dL (ref 8.4–10.5)
Chloride: 103 mEq/L (ref 96–112)
Creatinine, Ser: 0.91 mg/dL (ref 0.40–1.20)
GFR: 77.55 mL/min (ref >60.00)
Glucose, Bld: 91 mg/dL (ref 70–99)
Potassium: 4.6 mEq/L (ref 3.5–5.1)
Sodium: 138 mEq/L (ref 135–145)
Total Bilirubin: 0.5 mg/dL (ref 0.2–1.2)
Total Protein: 6.6 g/dL (ref 6.0–8.3)

## 2020-12-17 LAB — CBC
HCT: 44.1 % (ref 36.0–46.0)
Hemoglobin: 14.7 g/dL (ref 12.0–15.0)
MCHC: 33.4 g/dL (ref 30.0–36.0)
MCV: 86.4 fl (ref 78.0–100.0)
Platelets: 329 10*3/uL (ref 150.0–400.0)
RBC: 5.1 Mil/uL (ref 3.87–5.11)
RDW: 13.9 % (ref 11.5–15.5)
WBC: 6 10*3/uL (ref 4.0–10.5)

## 2020-12-17 LAB — LIPID PANEL
Cholesterol: 197 mg/dL (ref 0–200)
HDL: 70.8 mg/dL (ref >39.00)
LDL Cholesterol: 104 mg/dL — ABNORMAL HIGH (ref 0–99)
NonHDL: 126.43
Total CHOL/HDL Ratio: 3
Triglycerides: 113 mg/dL (ref 0.0–149.0)
VLDL: 22.6 mg/dL (ref 0.0–40.0)

## 2020-12-17 LAB — IBC + FERRITIN
Ferritin: 8.1 ng/mL — ABNORMAL LOW (ref 10.0–291.0)
Iron: 70 ug/dL (ref 42–145)
Saturation Ratios: 17.5 % — ABNORMAL LOW (ref 20.0–50.0)
Transferrin: 285 mg/dL (ref 212.0–360.0)

## 2020-12-17 LAB — VITAMIN B12: Vitamin B-12: 1257 pg/mL — ABNORMAL HIGH (ref 211–911)

## 2020-12-20 ENCOUNTER — Other Ambulatory Visit: Payer: Self-pay

## 2020-12-20 ENCOUNTER — Ambulatory Visit: Payer: 59

## 2020-12-20 DIAGNOSIS — M25661 Stiffness of right knee, not elsewhere classified: Secondary | ICD-10-CM | POA: Diagnosis not present

## 2020-12-20 DIAGNOSIS — M6281 Muscle weakness (generalized): Secondary | ICD-10-CM

## 2020-12-20 DIAGNOSIS — R2689 Other abnormalities of gait and mobility: Secondary | ICD-10-CM | POA: Diagnosis not present

## 2020-12-20 NOTE — Therapy (Signed)
[facility] MAIN REHAB SERVICES [address] [place], [place], [address] Phone: [phone]   Fax:  [phone]  Physical Therapy Treatment  Patient Details  Name: Dawn Hamilton MRN: [id number] Date of Birth: 08/21/1976 Referring Provider (PT): Varkey, Dax T   Encounter Date: 12/20/2020   PT End of Session - 12/20/20 0945    Visit Number 17    Number of Visits 23    Date for PT Re-Evaluation 12/27/20    Authorization Type 7/10 PN 11/03/20    PT Start Time 0800    PT Stop Time 0844    PT Time Calculation (min) 44 min    Activity Tolerance Patient tolerated treatment well    Behavior During Therapy WFL for tasks assessed/performed           Past Medical History:  Diagnosis Date  . Allergy   . Anxiety   . Depression   . Precancerous skin lesion     Past Surgical History:  Procedure Laterality Date  . KNEE ARTHROSCOPY WITH MEDIAL COLLATERAL LIGAMENT RECONSTRUCTION Right 09/23/2020   Procedure: RIGHT KNEE ARTHROSCOPY DEBRIDMENT  WITH MEDIAL COLLATERAL LIGAMENT RECONSTRUCTION;  Surgeon: Varkey, Dax T, MD;  Location: [facility];  Service: Orthopedics;  Laterality: Right;  . KNEE ARTHROSCOPY WITH MEDIAL PATELLAR FEMORAL LIGAMENT RECONSTRUCTION Right 10/02/2019   Procedure: KNEE ARTHROSCOPY WITH MEDIAL PATELLAR FEMORAL LIGAMENT RECONSTRUCTION WITH ALLOGRAPH;  Surgeon: Varkey, Dax T, MD;  Location: [facility];  Service: Orthopedics;  Laterality: Right;  . KNEE SURGERY     x3    There were no vitals filed for this visit.   Subjective Assessment - 12/20/20 0944    Subjective Patient has been cleared to return to running/ no longer on restrictions. Did have a night of increased pain on thursday/friday after session, hitting leg on car door, and physician appointment.    Pertinent History Patient is a 44 year old female s/p Right MCL reconstruction with tibialis anterior allograft on 09/23/20. 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 Last march she was having some pain and an MRI of the R knee showed positive MCl sprain but with no full thickness MCL injury. She was treated conservatively with J&J brace. She did not have any improvements and had worsening of sharp pains. 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    Limitations Sitting;Lifting;Standing;Walking;House hold activities    How long can you sit comfortably? requires J & J brace, uncomfortable for prolonged sitting    How long can you stand comfortably? WBAT with J &J brace    How long can you walk comfortably? today is first day WBAT with J& J brace    Patient Stated Goals to return to PLOF.    Currently in Pain? No/denies              Treadmill backwards ambulation 1.3-1.6 rpm. 4 minutes, focus on neutral alignment and muscle contraction.    5lb weighted ball -single limb stance chest press 10x -single limb stance rotation 10x  -ball pass with PT for pertubations in single limb stance 10x, 2 sets  -curtsy lunge, bend down to touch   ball then back upright again 10x each side, 2 sets  Orange TB around toes: back against wall  -marching 15x each LE  Squat to gentle jump back to squat with overhead reach in front of wall 10x, 2 sets    TrX machine: Posterior lunge to crange pose 15x Lateral small jumps 10x each side Posterior lunge to standard position, raise onto toes, then repeat 12x   Holding onto bar: 3 way hip raise 10x each LE Heel toe rocking 10x  Hooklying in PT gym: Roller to R calf 3 minutes Roller to R quad and ilipsoas musculature for tissue tension reduction  x 3 minutes   scar tissue massage x 2 minutes    Pt educated throughout session about proper posture and technique with exercises. Improved exercise technique, movement at target joints, use of target muscles after min to mod verbal, visual, tactile cues Patient is progressing with functional single limb stability as well as quadriceps activation. She is able to intrinsically control knee position with decreased need for external cueing for position. Upon meeting with surgeon Friday will potentially be cleared to return to running. Patient will benefit from skilled physical therapy to increase strength, alignment, and stabilization to RLE for return to PLOF                         PT Education - 12/20/20 0945    Education Details single limb stability, return to running    Person(s) Educated Patient    Methods Explanation;Demonstration;Tactile cues;Verbal cues    Comprehension Verbalized understanding;Returned demonstration;Verbal cues required;Tactile cues required            PT Short Term Goals - 11/03/20 0923      PT SHORT TERM GOAL #1   Title Patient will be independent in home exercise program to improve strength/mobility for better functional independence with ADLs.    Baseline 11/19: HEP given 12/22: HEP compliant    Time 2    Period Weeks    Status Achieved    Target Date 10/15/20      PT SHORT TERM GOAL #2   Title Patient will demonstrate 90 degrees of flexion and neutral knee extension with active range of motion of R knee for improved gait mechanics and muscular activation/alignment    Baseline 11/19: 57 flexion -1 extension 12/22: 134 AROM flexion    Time 2    Period Weeks    Status Achieved    Target Date 10/15/20             PT Long Term Goals - 11/15/20 1236      PT LONG TERM GOAL #1   Title Patient will demonstrate >/=120 degrees of flexion and neutral extension with active range of motion of R knee  for improved gait mechanics and  muscular activation/alignment and return to functional ROM    Baseline 11/19: 57 flexion, -1 extension 12/22: 134 active flexion    Time 6    Period Weeks    Status Achieved      PT LONG TERM GOAL #2   Title Patient will increase BLE gross strength to 4+/5 as to improve functional strength for independent gait, increased standing tolerance and increased ADL ability.    Baseline 11/19: see note 12/22: see note    Time 6    Period Weeks    Status Partially Met    Target Date 12/27/20      PT LONG TERM GOAL #3  Title Patient will increase lower extremity functional scale to >60/80 to demonstrate improved functional mobility and increased tolerance with ADLs.    Baseline 11/19: 32/808 12/23: 57/80    Time 6    Period Weeks    Status Partially Met    Target Date 12/27/20      PT LONG TERM GOAL #4   Title Patient will increase FOTO score to equal to or greater than   67%  to demonstrate statistically significant improvement in mobility and quality of life.    Baseline 11/19: 43% 12/23: 63%    Time 6    Period Weeks    Status Partially Met    Target Date 12/27/20      PT LONG TERM GOAL #5   Title Patient will perform a squat with equal weight shift and no LOB or increase of pain >1 degree on the VAS scale for functional activty performance and return to PLOF.    Baseline 11/19: unable 12/23: weight shift onto non surgical limb; 11/15/20: near full squat with no pain increase, slight loss of weight acceptance at final 15% of squat    Time 6    Period Weeks    Status Partially Met    Target Date 12/27/20      Additional Long Term Goals   Additional Long Term Goals Yes      PT LONG TERM GOAL #6   Title Patient will perform the 6 minute walk test for >1000 ft with no episodes of hip hiking, vaulting, or antalgia for return to PLOF to be within functional norms for age.    Baseline 11/19: unable to perform 12/23: 1565 ft    Time 6    Period Weeks    Status Achieved      PT LONG  TERM GOAL #7   Title Patient will tolerate 20 seconds of single leg stance without loss of balance to improve ability to get in and out of shower safely, return to gym activities, and return to PLOF.    Baseline 1/3: limited tolerance    Time 6    Period Weeks    Status New    Target Date 12/27/20      PT LONG TERM GOAL #8   Title Patient will perform the 6 minute walk test for >1800 ft with no episodes of hip hiking, vaulting, or antalgia for return to PLOF to be within functional norms for age.    Baseline 12/23: 1565 ft    Time 6    Period Weeks    Status New    Target Date 12/27/20                 Plan - 12/20/20 0946    Clinical Impression Statement Patient has been given clearance to return to running/all gym programs at this time. Gentle introduction to return to run protocol performed with focus on single limb stability and pertubation's caused by gentle jumping that can be seen in running. Patient tolerated all interventions well without complaint. Patient will benefit from skilled physical therapy to increase strength, alignment, and stabilization to RLE for return to PLOF    Personal Factors and Comorbidities Comorbidity 2;Past/Current Experience;Profession    Comorbidities three previous MPFL surgeries, anemia    Examination-Activity Limitations Bathing;Bed Mobility;Caring for Others;Carry;Stairs;Squat;Sleep;Sit;Locomotion Level;Lift;Stand;Transfers    Examination-Participation Restrictions Cleaning;Community Activity;Meal Prep;Laundry;Shop;Yard Work;Occupation    Stability/Clinical Decision Making Stable/Uncomplicated    Rehab Potential Good    PT Frequency 2x / week  PT Duration 6 weeks    PT Treatment/Interventions ADLs/Self Care Home Management;Aquatic Therapy;Cryotherapy;Electrical Stimulation;Biofeedback;Iontophoresis 30m/ml Dexamethasone;Moist Heat;Ultrasound;Functional mobility training;Stair training;Gait training;DME Instruction;Therapeutic  activities;Therapeutic exercise;Balance training;Neuromuscular re-education;Manual techniques;Patient/family education;Compression bandaging;Scar mobilization;Passive range of motion;Dry needling;Taping;Splinting;Energy conservation;Orthotic Fit/Training    PT Next Visit Plan ROM, strength    PT Home Exercise Plan see above    Consulted and Agree with Plan of Care Patient           Patient will benefit from skilled therapeutic intervention in order to improve the following deficits and impairments:  Abnormal gait,Decreased activity tolerance,Decreased endurance,Decreased mobility,Decreased range of motion,Difficulty walking,Decreased strength,Decreased scar mobility,Increased edema,Impaired flexibility,Impaired perceived functional ability,Improper body mechanics,Pain,Increased muscle spasms,Impaired sensation,Postural dysfunction  Visit Diagnosis: Stiffness of right knee, not elsewhere classified  Other abnormalities of gait and mobility  Muscle weakness (generalized)     Problem List Patient Active Problem List   Diagnosis Date Noted  . Vitamin B12 deficiency 12/18/2019  . Plant irritant contact dermatitis 06/17/2019  . Iron deficiency anemia 12/09/2018  . Preventative health care 12/04/2016  . Seasonal allergic rhinitis 11/27/2014   MJanna Arch PT, DPT   12/20/2020, 9:47 AM  [place] [facility] [address] [place] [address]Phone: [phone]  Fax:  [phone] Name: Dawn MootyMRN: [id number] of Birth: [birth year]

## 2020-12-21 ENCOUNTER — Other Ambulatory Visit: Payer: Self-pay

## 2020-12-21 ENCOUNTER — Encounter: Payer: Self-pay | Admitting: Primary Care

## 2020-12-21 ENCOUNTER — Ambulatory Visit (INDEPENDENT_AMBULATORY_CARE_PROVIDER_SITE_OTHER): Payer: 59 | Admitting: Primary Care

## 2020-12-21 VITALS — BP 120/74 | HR 67 | Temp 97.6°F | Ht 68.75 in | Wt 198.0 lb

## 2020-12-21 DIAGNOSIS — E538 Deficiency of other specified B group vitamins: Secondary | ICD-10-CM

## 2020-12-21 DIAGNOSIS — D509 Iron deficiency anemia, unspecified: Secondary | ICD-10-CM

## 2020-12-21 DIAGNOSIS — J302 Other seasonal allergic rhinitis: Secondary | ICD-10-CM

## 2020-12-21 DIAGNOSIS — Z0000 Encounter for general adult medical examination without abnormal findings: Secondary | ICD-10-CM

## 2020-12-21 DIAGNOSIS — Z23 Encounter for immunization: Secondary | ICD-10-CM | POA: Diagnosis not present

## 2020-12-21 NOTE — Assessment & Plan Note (Signed)
Tetanus due today, provided. Other vaccines UTD. Pap smear due, follows with GYN and will schedule.  Mammogram UTD.  Discussed the importance of a healthy diet and regular exercise in order for weight loss, and to reduce the risk of any potential medical problems.  Exam today unremarkable. Labs reviewed.

## 2020-12-21 NOTE — Assessment & Plan Note (Signed)
Seasonal, using Flonase and Claritin PRN. Continue same.

## 2020-12-21 NOTE — Addendum Note (Signed)
Addended by: Francella Solian on: 12/21/2020 08:31 AM   Modules accepted: Orders

## 2020-12-21 NOTE — Patient Instructions (Signed)
Start exercising. You should be getting 150 minutes of moderate intensity exercise weekly.  Continue to work on a healthy diet Ensure you are consuming 64 ounces of water daily.  It was a pleasure to see you today!   Preventive Care 50-43 Years Old, Female Preventive care refers to lifestyle choices and visits with your health care provider that can promote health and wellness. This includes:  A yearly physical exam. This is also called an annual wellness visit.  Regular dental and eye exams.  Immunizations.  Screening for certain conditions.  Healthy lifestyle choices, such as: ? Eating a healthy diet. ? Getting regular exercise. ? Not using drugs or products that contain nicotine and tobacco. ? Limiting alcohol use. What can I expect for my preventive care visit? Physical exam Your health care provider will check your:  Height and weight. These may be used to calculate your BMI (body mass index). BMI is a measurement that tells if you are at a healthy weight.  Heart rate and blood pressure.  Body temperature.  Skin for abnormal spots. Counseling Your health care provider may ask you questions about your:  Past medical problems.  Family's medical history.  Alcohol, tobacco, and drug use.  Emotional well-being.  Home life and relationship well-being.  Sexual activity.  Diet, exercise, and sleep habits.  Work and work Statistician.  Access to firearms.  Method of birth control.  Menstrual cycle.  Pregnancy history. What immunizations do I need? Vaccines are usually given at various ages, according to a schedule. Your health care provider will recommend vaccines for you based on your age, medical history, and lifestyle or other factors, such as travel or where you work.   What tests do I need? Blood tests  Lipid and cholesterol levels. These may be checked every 5 years, or more often if you are over 1 years old.  Hepatitis C test.  Hepatitis B  test. Screening  Lung cancer screening. You may have this screening every year starting at age 72 if you have a 30-pack-year history of smoking and currently smoke or have quit within the past 15 years.  Colorectal cancer screening. ? All adults should have this screening starting at age 42 and continuing until age 36. ? Your health care provider may recommend screening at age 9 if you are at increased risk. ? You will have tests every 1-10 years, depending on your results and the type of screening test.  Diabetes screening. ? This is done by checking your blood sugar (glucose) after you have not eaten for a while (fasting). ? You may have this done every 1-3 years.  Mammogram. ? This may be done every 1-2 years. ? Talk with your health care provider about when you should start having regular mammograms. This may depend on whether you have a family history of breast cancer.  BRCA-related cancer screening. This may be done if you have a family history of breast, ovarian, tubal, or peritoneal cancers.  Pelvic exam and Pap test. ? This may be done every 3 years starting at age 49. ? Starting at age 4, this may be done every 5 years if you have a Pap test in combination with an HPV test. Other tests  STD (sexually transmitted disease) testing, if you are at risk.  Bone density scan. This is done to screen for osteoporosis. You may have this scan if you are at high risk for osteoporosis. Talk with your health care provider about your test results, treatment  options, and if necessary, the need for more tests. Follow these instructions at home: Eating and drinking  Eat a diet that includes fresh fruits and vegetables, whole grains, lean protein, and low-fat dairy products.  Take vitamin and mineral supplements as recommended by your health care provider.  Do not drink alcohol if: ? Your health care provider tells you not to drink. ? You are pregnant, may be pregnant, or are planning  to become pregnant.  If you drink alcohol: ? Limit how much you have to 0-1 drink a day. ? Be aware of how much alcohol is in your drink. In the [place] one drink equals one 12 oz bottle of beer (355 mL), one 5 oz glass of wine (148 mL), or one 1 oz glass of hard liquor (44 mL).   Lifestyle  Take daily care of your teeth and gums. Brush your teeth every morning and night with fluoride toothpaste. Floss one time each day.  Stay active. Exercise for at least 30 minutes 5 or more days each week.  Do not use any products that contain nicotine or tobacco, such as cigarettes, e-cigarettes, and chewing tobacco. If you need help quitting, ask your health care provider.  Do not use drugs.  If you are sexually active, practice safe sex. Use a condom or other form of protection to prevent STIs (sexually transmitted infections).  If you do not wish to become pregnant, use a form of birth control. If you plan to become pregnant, see your health care provider for a prepregnancy visit.  If told by your health care provider, take low-dose aspirin daily starting at age 50.  Find healthy ways to cope with stress, such as: ? Meditation, yoga, or listening to music. ? Journaling. ? Talking to a trusted person. ? Spending time with friends and family. Safety  Always wear your seat belt while driving or riding in a vehicle.  Do not drive: ? If you have been drinking alcohol. Do not ride with someone who has been drinking. ? When you are tired or distracted. ? While texting.  Wear a helmet and other protective equipment during sports activities.  If you have firearms in your house, make sure you follow all gun safety procedures. What's next?  Visit your health care provider once a year for an annual wellness visit.  Ask your health care provider how often you should have your eyes and teeth checked.  Stay up to date on all vaccines. This information is not intended to replace advice given to you  by your health care provider. Make sure you discuss any questions you have with your health care provider. Document Revised: 08/03/2020 Document Reviewed: 07/11/2018 Elsevier Patient Education  2021 Elsevier Inc.  

## 2020-12-21 NOTE — Progress Notes (Signed)
Subjective:    Patient ID: Dawn Hamilton, female    DOB: 1976/05/24, 45 y.o.   MRN: [id number]  HPI  This visit occurred during the SARS-CoV-2 public health emergency.  Safety protocols were in place, including screening questions prior to the visit, additional usage of staff PPE, and extensive cleaning of exam room while observing appropriate contact time as indicated for disinfecting solutions.   Dawn Hamilton is a 45 year old female who presents today for complete physical.  Immunizations: -Tetanus: 2012 -Influenza: Completed  -Covid-19: Completed three vaccines  Diet: She endorses a healthy diet.  Exercise: Little exercise   Eye exam: Completes annually  Dental exam: Completes semi-annually   Pap Smear: Follows with GYN Mammogram: Completed in September 2021  BP Readings from Last 3 Encounters:  12/21/20 120/74  09/23/20 118/75  08/01/20 (!) 141/98   Wt Readings from Last 3 Encounters:  12/21/20 198 lb (89.8 kg)  09/23/20 188 lb 15 oz (85.7 kg)  08/01/20 185 lb (83.9 kg)     Review of Systems  Constitutional: Negative for unexpected weight change.  HENT: Negative for rhinorrhea.   Eyes: Negative for visual disturbance.  Respiratory: Negative for cough and shortness of breath.   Cardiovascular: Negative for chest pain.  Gastrointestinal: Negative for constipation and diarrhea.  Genitourinary: Negative for difficulty urinating and menstrual problem.  Musculoskeletal: Negative for arthralgias and myalgias.  Skin: Negative for rash.  Allergic/Immunologic: Positive for environmental allergies.  Neurological: Negative for dizziness and headaches.  Psychiatric/Behavioral: The patient is not nervous/anxious.        Past Medical History:  Diagnosis Date  . Allergy   . Anxiety   . Depression   . Precancerous skin lesion      Social History   Socioeconomic History  . Marital status: Married    Spouse name: Not on file  . Number of children: Not on file   . Years of education: Not on file  . Highest education level: Not on file  Occupational History  . Not on file  Tobacco Use  . Smoking status: Never Smoker  . Smokeless tobacco: Never Used  Vaping Use  . Vaping Use: Never used  Substance and Sexual Activity  . Alcohol use: Yes  . Drug use: Never  . Sexual activity: Not on file  Other Topics Concern  . Not on file  Social History Narrative   Married.   2 children.   Works as a Psychologist, forensic.   Enjoys spending time outdoors, going to the lake.   Social Determinants of Health   Financial Resource Strain: Not on file  Food Insecurity: Not on file  Transportation Needs: Not on file  Physical Activity: Not on file  Stress: Not on file  Social Connections: Not on file  Intimate Partner Violence: Not on file    Past Surgical History:  Procedure Laterality Date  . KNEE ARTHROSCOPY WITH MEDIAL COLLATERAL LIGAMENT RECONSTRUCTION Right 09/23/2020   Procedure: RIGHT KNEE ARTHROSCOPY DEBRIDMENT  WITH MEDIAL COLLATERAL LIGAMENT RECONSTRUCTION;  Surgeon: Hiram Gash, MD;  Location: [facility];  Service: Orthopedics;  Laterality: Right;  . KNEE ARTHROSCOPY WITH MEDIAL PATELLAR FEMORAL LIGAMENT RECONSTRUCTION Right 10/02/2019   Procedure: KNEE ARTHROSCOPY WITH MEDIAL PATELLAR FEMORAL LIGAMENT RECONSTRUCTION WITH ALLOGRAPH;  Surgeon: Hiram Gash, MD;  Location: [facility];  Service: Orthopedics;  Laterality: Right;  . KNEE SURGERY     x3    Family History  Problem Relation Age of Onset  .  Colon cancer Maternal Grandfather   . Diabetes Maternal Grandfather   . Bladder Cancer Paternal Grandmother   . Healthy Mother   . Healthy Father     Allergies  Allergen Reactions  . Codeine Nausea Only  . Wound Dressing Adhesive   . Benzoin Rash  . Iodine Rash    Current Outpatient Medications on File Prior to Visit  Medication Sig Dispense Refill  . cholecalciferol (VITAMIN D3) 25 MCG (1000 UNIT) tablet  Take 1,000 Units by mouth daily.    . fluticasone (FLONASE) 50 MCG/ACT nasal spray Place 2 sprays into both nostrils daily.    Marland Kitchen loratadine (CLARITIN) 10 MG tablet Take 10 mg by mouth daily as needed for allergies.    . Multiple Vitamins-Minerals (MULTIVITAMIN ADULT) TABS     . vitamin B-12 (CYANOCOBALAMIN) 1000 MCG tablet Take 1,000 mcg by mouth daily.     No current facility-administered medications on file prior to visit.    BP 120/74   Pulse 67   Temp 97.6 F (36.4 C) (Temporal)   Ht 5' 8.75" (1.746 m)   Wt 198 lb (89.8 kg)   SpO2 97%   BMI 29.45 kg/m    Objective:   Physical Exam Constitutional:      Appearance: She is well-nourished.  HENT:     Right Ear: Tympanic membrane and ear canal normal.     Left Ear: Tympanic membrane and ear canal normal.     Mouth/Throat:     Mouth: Oropharynx is clear and moist.  Eyes:     Extraocular Movements: EOM normal.     Pupils: Pupils are equal, round, and reactive to light.  Cardiovascular:     Rate and Rhythm: Normal rate and regular rhythm.  Pulmonary:     Effort: Pulmonary effort is normal.     Breath sounds: Normal breath sounds.  Abdominal:     General: Bowel sounds are normal.     Palpations: Abdomen is soft.     Tenderness: There is no abdominal tenderness.  Musculoskeletal:        General: Normal range of motion.     Cervical back: Neck supple.  Skin:    General: Skin is warm and dry.  Neurological:     Mental Status: She is alert and oriented to person, place, and time.     Cranial Nerves: No cranial nerve deficit.     Deep Tendon Reflexes:     Reflex Scores:      Patellar reflexes are 2+ on the right side and 2+ on the left side. Psychiatric:        Mood and Affect: Mood and affect and mood normal.            Assessment & Plan:

## 2020-12-21 NOTE — Assessment & Plan Note (Signed)
Well controlled on recent labs asymptomatic.

## 2020-12-21 NOTE — Assessment & Plan Note (Signed)
Compliant to daily B12, recent level slightly too high. Reduce frequency to four days weekly.

## 2020-12-22 ENCOUNTER — Ambulatory Visit: Payer: 59

## 2020-12-22 DIAGNOSIS — R635 Abnormal weight gain: Secondary | ICD-10-CM

## 2020-12-22 DIAGNOSIS — R2689 Other abnormalities of gait and mobility: Secondary | ICD-10-CM

## 2020-12-22 DIAGNOSIS — M25661 Stiffness of right knee, not elsewhere classified: Secondary | ICD-10-CM | POA: Diagnosis not present

## 2020-12-22 DIAGNOSIS — M6281 Muscle weakness (generalized): Secondary | ICD-10-CM

## 2020-12-22 NOTE — Therapy (Signed)
[place] [facility] SERVICES [address] [place], [place], [address] Phone: [phone]   Fax:  [phone]  Physical Therapy Treatment  Patient Details  Name: Wynne Rozak MRN: [id number] Date of Birth: Jun 28, 1976 Referring Provider (PT): Hiram Gash   Encounter Date: 12/22/2020   PT End of Session - 12/22/20 1002    Visit Number 18    Number of Visits 23    Date for PT Re-Evaluation 12/27/20    Authorization Type 8/10 PN 11/03/20    PT Start Time 0759    PT Stop Time 0844    PT Time Calculation (min) 45 min    Activity Tolerance Patient tolerated treatment well    Behavior During Therapy [facility] for tasks assessed/performed           Past Medical History:  Diagnosis Date  . Allergy   . Anxiety   . Depression   . Precancerous skin lesion     Past Surgical History:  Procedure Laterality Date  . KNEE ARTHROSCOPY WITH MEDIAL COLLATERAL LIGAMENT RECONSTRUCTION Right 09/23/2020   Procedure: RIGHT KNEE ARTHROSCOPY DEBRIDMENT  WITH MEDIAL COLLATERAL LIGAMENT RECONSTRUCTION;  Surgeon: Hiram Gash, MD;  Location: [place];  Service: Orthopedics;  Laterality: Right;  . KNEE ARTHROSCOPY WITH MEDIAL PATELLAR FEMORAL LIGAMENT RECONSTRUCTION Right 10/02/2019   Procedure: KNEE ARTHROSCOPY WITH MEDIAL PATELLAR FEMORAL LIGAMENT RECONSTRUCTION WITH ALLOGRAPH;  Surgeon: Hiram Gash, MD;  Location: [place];  Service: Orthopedics;  Laterality: Right;  . KNEE SURGERY     x3    There were no vitals filed for this visit.   Subjective Assessment - 12/22/20 1000    Subjective Patient reports soreness after last session but no pain. Has been compliant with HEP.    Pertinent History Patient is a 45 year old female s/p Right MCL reconstruction with tibialis anterior allograft on 09/23/20. 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 Last march she was having some pain and an MRI of the R knee showed positive MCl sprain but with no full thickness MCL injury. She was treated conservatively with J&J brace. She did not have any improvements and had worsening of sharp pains. 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    Limitations Sitting;Lifting;Standing;Walking;House hold activities    How long can you sit comfortably? requires J & J brace, uncomfortable for prolonged sitting    How long can you stand comfortably? WBAT with J &J brace    How long can you walk comfortably? today is first day WBAT with J& J brace    Patient Stated Goals to return to PLOF.    Currently in Pain? No/denies             Treadmill backwards ambulation 1.3-1.6 rpm. 4 minutes, focus on neutral alignment and muscle contraction.    Plyometric routine: Two leg ankle hops in place: 3 sets 30 foot contacts per set Two leg ankle hops forward/backwards 3 sets of 30 hops Two leg ankle hops side to side 3 sets of 30 hops each set One leg ankle hops in place 3 sets of 20 hops  One leg  hops broad hop 4 sets of 5 Rest 90 seconds between sets, 3 minutes between exercises; focus on toe heel landing, triple flexion (hip and knee flexion, ankle df and knee extension, pf) and soft landing.   Leg extension machine #1 plate, 10x each foot placement (IR, neutral, ER) for optimal quad activation technique, 2 sets total     Wall squat (modified to 45 degree angle); 6x; last two sets with LLE toe touch weightbearing.   Prone:  froggers 15x; 2 sets  Hooklying in PT gym: Roller to R calf 3 minutes Roller to R quad and ilipsoas musculature for tissue tension reduction x 3 minutes      Pt  educated throughout session about proper posture and technique with exercises. Improved exercise technique, movement at target joints, use of target muscles after min to mod verbal, visual, tactile cues    Patient has pain mid anterior calf with all jumping/landing plyometric interventions limiting her throughout the session. Patient agreeable to follow up with physician at next appointment due to focal tenderness. Patient will benefit from skilled physical therapy to increase strength, alignment, and stabilization to RLE for return to PLOF                     PT Education - 12/22/20 1002    Education Details exercise technique, body mechanics , plyometric interventions    Person(s) Educated Patient    Methods Explanation;Demonstration;Tactile cues;Verbal cues    Comprehension Verbalized understanding;Returned demonstration;Verbal cues required;Tactile cues required            PT Short Term Goals - 11/03/20 0923      PT SHORT TERM GOAL #1   Title Patient will be independent in home exercise program to improve strength/mobility for better functional independence with ADLs.    Baseline 11/19: HEP given 12/22: HEP compliant    Time 2    Period Weeks    Status Achieved    Target Date 10/15/20      PT SHORT TERM GOAL #2   Title Patient will demonstrate 90 degrees of flexion and neutral knee extension with active range of motion of R knee for improved gait mechanics and muscular activation/alignment    Baseline 11/19: 57 flexion -1 extension 12/22: 134 AROM flexion    Time 2    Period Weeks    Status Achieved    Target Date 10/15/20             PT Long Term Goals - 11/15/20 1236      PT LONG TERM GOAL #1   Title Patient will demonstrate >/=120 degrees of flexion and neutral extension with active range of motion of R knee  for improved gait mechanics and muscular activation/alignment and return to functional ROM    Baseline 11/19: 57 flexion, -1 extension 12/22:  134 active flexion    Time 6    Period Weeks    Status Achieved      PT LONG TERM GOAL #2   Title Patient will increase BLE gross strength to 4+/5 as to improve functional strength for independent gait, increased standing tolerance and increased ADL ability.    Baseline 11/19: see note 12/22: see note    Time 6    Period Weeks    Status Partially Met    Target Date 12/27/20      PT LONG TERM GOAL #3   Title Patient will increase lower extremity functional scale to >60/80 to demonstrate improved functional mobility and  increased tolerance with ADLs.    Baseline 11/19: 32/808 12/23: 57/80    Time 6    Period Weeks    Status Partially Met    Target Date 12/27/20      PT LONG TERM GOAL #4   Title Patient will increase FOTO score to equal to or greater than   67%  to demonstrate statistically significant improvement in mobility and quality of life.    Baseline 11/19: 43% 12/23: 63%    Time 6    Period Weeks    Status Partially Met    Target Date 12/27/20      PT LONG TERM GOAL #5   Title Patient will perform a squat with equal weight shift and no LOB or increase of pain >1 degree on the VAS scale for functional activty performance and return to PLOF.    Baseline 11/19: unable 12/23: weight shift onto non surgical limb; 11/15/20: near full squat with no pain increase, slight loss of weight acceptance at final 15% of squat    Time 6    Period Weeks    Status Partially Met    Target Date 12/27/20      Additional Long Term Goals   Additional Long Term Goals Yes      PT LONG TERM GOAL #6   Title Patient will perform the 6 minute walk test for >1000 ft with no episodes of hip hiking, vaulting, or antalgia for return to PLOF to be within functional norms for age.    Baseline 11/19: unable to perform 12/23: 1565 ft    Time 6    Period Weeks    Status Achieved      PT LONG TERM GOAL #7   Title Patient will tolerate 20 seconds of single leg stance without loss of balance to improve  ability to get in and out of shower safely, return to gym activities, and return to PLOF.    Baseline 1/3: limited tolerance    Time 6    Period Weeks    Status New    Target Date 12/27/20      PT LONG TERM GOAL #8   Title Patient will perform the 6 minute walk test for >1800 ft with no episodes of hip hiking, vaulting, or antalgia for return to PLOF to be within functional norms for age.    Baseline 12/23: 1565 ft    Time 6    Period Weeks    Status New    Target Date 12/27/20                 Plan - 12/22/20 1004    Clinical Impression Statement Patient has pain mid anterior calf with all jumping/landing plyometric interventions limiting her throughout the session. Patient agreeable to follow up with physician at next appointment due to focal tenderness. Patient will benefit from skilled physical therapy to increase strength, alignment, and stabilization to RLE for return to PLOF    Personal Factors and Comorbidities Comorbidity 2;Past/Current Experience;Profession    Comorbidities three previous MPFL surgeries, anemia    Examination-Activity Limitations Bathing;Bed Mobility;Caring for Others;Carry;Stairs;Squat;Sleep;Sit;Locomotion Level;Lift;Stand;Transfers    Examination-Participation Restrictions Cleaning;Community Activity;Meal Prep;Laundry;Shop;Yard Work;Occupation    Stability/Clinical Decision Making Stable/Uncomplicated    Rehab Potential Good    PT Frequency 2x / week    PT Duration 6 weeks    PT Treatment/Interventions ADLs/Self Care Home Management;Aquatic Therapy;Cryotherapy;Electrical Stimulation;Biofeedback;Iontophoresis 4mg /ml Dexamethasone;Moist Heat;Ultrasound;Functional mobility training;Stair training;Gait training;DME Instruction;Therapeutic activities;Therapeutic exercise;Balance training;Neuromuscular re-education;Manual techniques;Patient/family education;Compression bandaging;Scar mobilization;Passive range  of motion;Dry needling;Taping;Splinting;Energy  conservation;Orthotic Fit/Training    PT Next Visit Plan ROM, strength    PT Home Exercise Plan see above    Consulted and Agree with Plan of Care Patient           Patient will benefit from skilled therapeutic intervention in order to improve the following deficits and impairments:  Abnormal gait,Decreased activity tolerance,Decreased endurance,Decreased mobility,Decreased range of motion,Difficulty walking,Decreased strength,Decreased scar mobility,Increased edema,Impaired flexibility,Impaired perceived functional ability,Improper body mechanics,Pain,Increased muscle spasms,Impaired sensation,Postural dysfunction  Visit Diagnosis: Stiffness of right knee, not elsewhere classified  Other abnormalities of gait and mobility  Muscle weakness (generalized)     Problem List Patient Active Problem List   Diagnosis Date Noted  . Vitamin B12 deficiency 12/18/2019  . Plant irritant contact dermatitis 06/17/2019  . Iron deficiency anemia 12/09/2018  . Preventative health care 12/04/2016  . Seasonal allergic rhinitis 11/27/2014   Janna Arch, PT, DPT  12/22/2020, 10:05 AM  [place] [facility] SERVICES [address] [place], [place], [address] Phone: [phone]   Fax:  [phone]  Name: Marialena Wollen MRN: [id number] Date of Birth: 1976/05/07

## 2020-12-27 ENCOUNTER — Other Ambulatory Visit (INDEPENDENT_AMBULATORY_CARE_PROVIDER_SITE_OTHER): Payer: 59

## 2020-12-27 ENCOUNTER — Ambulatory Visit: Payer: 59

## 2020-12-27 ENCOUNTER — Other Ambulatory Visit: Payer: Self-pay

## 2020-12-27 DIAGNOSIS — M6281 Muscle weakness (generalized): Secondary | ICD-10-CM

## 2020-12-27 DIAGNOSIS — R635 Abnormal weight gain: Secondary | ICD-10-CM

## 2020-12-27 DIAGNOSIS — R2689 Other abnormalities of gait and mobility: Secondary | ICD-10-CM

## 2020-12-27 DIAGNOSIS — M25661 Stiffness of right knee, not elsewhere classified: Secondary | ICD-10-CM | POA: Diagnosis not present

## 2020-12-27 LAB — TSH: TSH: 2.01 u[IU]/mL (ref 0.35–4.50)

## 2020-12-27 LAB — T4, FREE: Free T4: 0.86 ng/dL (ref 0.60–1.60)

## 2020-12-27 LAB — VITAMIN D 25 HYDROXY (VIT D DEFICIENCY, FRACTURES): VITD: 39.07 ng/mL (ref 30.00–100.00)

## 2020-12-27 NOTE — Therapy (Signed)
[place] [facility] SERVICES [address] [place], [place], [address] Phone: [phone]   Fax:  [phone]  Physical Therapy Treatment/DISCHARGE  Patient Details  Name: Dawn Hamilton MRN: [id number] Date of Birth: 1976/05/14 Referring Provider (PT): Hiram Gash   Encounter Date: 12/27/2020   PT End of Session - 12/27/20 0830    Visit Number 19    Number of Visits 23    Date for PT Re-Evaluation 12/27/20    Authorization Type 9/10 PN 11/03/20    PT Start Time 0800    PT Stop Time 0820    PT Time Calculation (min) 20 min    Activity Tolerance Patient tolerated treatment well    Behavior During Therapy [facility] for tasks assessed/performed           Past Medical History:  Diagnosis Date  . Allergy   . Anxiety   . Depression   . Precancerous skin lesion     Past Surgical History:  Procedure Laterality Date  . KNEE ARTHROSCOPY WITH MEDIAL COLLATERAL LIGAMENT RECONSTRUCTION Right 09/23/2020   Procedure: RIGHT KNEE ARTHROSCOPY DEBRIDMENT  WITH MEDIAL COLLATERAL LIGAMENT RECONSTRUCTION;  Surgeon: Hiram Gash, MD;  Location: [place];  Service: Orthopedics;  Laterality: Right;  . KNEE ARTHROSCOPY WITH MEDIAL PATELLAR FEMORAL LIGAMENT RECONSTRUCTION Right 10/02/2019   Procedure: KNEE ARTHROSCOPY WITH MEDIAL PATELLAR FEMORAL LIGAMENT RECONSTRUCTION WITH ALLOGRAPH;  Surgeon: Hiram Gash, MD;  Location: [place];  Service: Orthopedics;  Laterality: Right;  . KNEE SURGERY     x3    There were no vitals filed for this visit.   Subjective Assessment - 12/27/20 0828    Subjective Patient has been able to perform short duration jog with kids but no longer is interested in returning to running. No falls or LOB, has been compliant with HEP.    Pertinent History Patient is a 45 year old female s/p Right MCL reconstruction with tibialis anterior allograft on 09/23/20. 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 Last march she was having some pain and an MRI of the R knee showed positive MCl sprain but with no full thickness MCL injury. She was treated conservatively with J&J brace. She did not have any improvements and had worsening of sharp pains. 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    Limitations Sitting;Lifting;Standing;Walking;House hold activities    How long can you sit comfortably? requires J & J brace, uncomfortable for prolonged sitting    How long can you stand comfortably? WBAT with J &J brace    How long can you walk comfortably? today is first day WBAT with J& J brace    Patient Stated Goals to return to PLOF.               BLE strength: grossly 5/5  LEFS : 78/80 FOTO :91%  Squat: equal weight shift bilaterally 6 min walk test 1955 ft 20 seconds SLS: 22 seconds no LOB       Patient has met goals at this time. Due to her being at her PLOF and patient being satisfied with current level of mobility this will  be patients last session. I will be happy to see this patient again in the future as needed.                  PT Education - 12/27/20 0829    Education Details goals, POC, discharge    Person(s) Educated Patient    Methods Explanation;Demonstration;Tactile cues;Verbal cues    Comprehension Verbalized understanding;Returned demonstration;Verbal cues required;Tactile cues required            PT Short Term Goals - 11/03/20 0923      PT SHORT TERM GOAL #1   Title Patient will be independent in home exercise program to improve strength/mobility for better functional independence with ADLs.     Baseline 11/19: HEP given 12/22: HEP compliant    Time 2    Period Weeks    Status Achieved    Target Date 10/15/20      PT SHORT TERM GOAL #2   Title Patient will demonstrate 90 degrees of flexion and neutral knee extension with active range of motion of R knee for improved gait mechanics and muscular activation/alignment    Baseline 11/19: 57 flexion -1 extension 12/22: 134 AROM flexion    Time 2    Period Weeks    Status Achieved    Target Date 10/15/20             PT Long Term Goals - 12/27/20 0834      PT LONG TERM GOAL #1   Title Patient will demonstrate >/=120 degrees of flexion and neutral extension with active range of motion of R knee  for improved gait mechanics and muscular activation/alignment and return to functional ROM    Baseline 11/19: 57 flexion, -1 extension 12/22: 134 active flexion    Time 6    Period Weeks    Status Achieved      PT LONG TERM GOAL #2   Title Patient will increase BLE gross strength to 4+/5 as to improve functional strength for independent gait, increased standing tolerance and increased ADL ability.    Baseline 11/19: see note 12/22: see note 2/14: grossly 5/5 bilaterally    Time 6    Period Weeks    Status Achieved      PT LONG TERM GOAL #3   Title Patient will increase lower extremity functional scale to >60/80 to demonstrate improved functional mobility and increased tolerance with ADLs.    Baseline 11/19: 32/808 12/23: 57/80 2/14: 78/80    Time 6    Period Weeks    Status Achieved      PT LONG TERM GOAL #4   Title Patient will increase FOTO score to equal to or greater than   67%  to demonstrate statistically significant improvement in mobility and quality of life.    Baseline 11/19: 43% 12/23: 63% 2/14: 91%    Time 6    Period Weeks    Status Achieved      PT LONG TERM GOAL #5   Title Patient will perform a squat with equal weight shift and no LOB or increase of pain >1 degree on the VAS scale for functional activty  performance and return to PLOF.    Baseline 11/19: unable 12/23: weight shift onto non surgical limb; 11/15/20: near full squat with no pain increase, slight loss of weight acceptance at final 15% of squat 2/14: equal weight shift    Time 6    Period Weeks    Status Achieved  PT LONG TERM GOAL #6   Title Patient will perform the 6 minute walk test for >1000 ft with no episodes of hip hiking, vaulting, or antalgia for return to PLOF to be within functional norms for age.    Baseline 11/19: unable to perform 12/23: 1565 ft    Time 6    Period Weeks    Status Achieved      PT LONG TERM GOAL #7   Title Patient will tolerate 20 seconds of single leg stance without loss of balance to improve ability to get in and out of shower safely, return to gym activities, and return to PLOF.    Baseline 1/3: limited tolerance 2/14: 22 seconds no LOB    Time 6    Period Weeks    Status Achieved      PT LONG TERM GOAL #8   Title Patient will perform the 6 minute walk test for >1800 ft with no episodes of hip hiking, vaulting, or antalgia for return to PLOF to be within functional norms for age.    Baseline 12/23: 1565 ft 2/14: 1955 ft    Time 6    Period Weeks    Status Achieved                 Plan - 12/27/20 4709    Clinical Impression Statement Patient has met goals at this time. Due to her being at her PLOF and patient being satisfied with current level of mobility this will be patients last session. I will be happy to see this patient again in the future as needed.    Personal Factors and Comorbidities Comorbidity 2;Past/Current Experience;Profession    Comorbidities three previous MPFL surgeries, anemia    Examination-Activity Limitations Bathing;Bed Mobility;Caring for Others;Carry;Stairs;Squat;Sleep;Sit;Locomotion Level;Lift;Stand;Transfers    Examination-Participation Restrictions Cleaning;Community Activity;Meal Prep;Laundry;Shop;Yard Work;Occupation    Stability/Clinical Decision  Making Stable/Uncomplicated    Rehab Potential Good    PT Frequency 2x / week    PT Duration 6 weeks    PT Treatment/Interventions ADLs/Self Care Home Management;Aquatic Therapy;Cryotherapy;Electrical Stimulation;Biofeedback;Iontophoresis 4mg /ml Dexamethasone;Moist Heat;Ultrasound;Functional mobility training;Stair training;Gait training;DME Instruction;Therapeutic activities;Therapeutic exercise;Balance training;Neuromuscular re-education;Manual techniques;Patient/family education;Compression bandaging;Scar mobilization;Passive range of motion;Dry needling;Taping;Splinting;Energy conservation;Orthotic Fit/Training    PT Next Visit Plan ROM, strength    PT Home Exercise Plan see above    Consulted and Agree with Plan of Care Patient           Patient will benefit from skilled therapeutic intervention in order to improve the following deficits and impairments:  Abnormal gait,Decreased activity tolerance,Decreased endurance,Decreased mobility,Decreased range of motion,Difficulty walking,Decreased strength,Decreased scar mobility,Increased edema,Impaired flexibility,Impaired perceived functional ability,Improper body mechanics,Pain,Increased muscle spasms,Impaired sensation,Postural dysfunction  Visit Diagnosis: Stiffness of right knee, not elsewhere classified  Other abnormalities of gait and mobility  Muscle weakness (generalized)     Problem List Patient Active Problem List   Diagnosis Date Noted  . Vitamin B12 deficiency 12/18/2019  . Plant irritant contact dermatitis 06/17/2019  . Iron deficiency anemia 12/09/2018  . Preventative health care 12/04/2016  . Seasonal allergic rhinitis 11/27/2014   Janna Arch, PT, DPT   12/27/2020, 12:30 PM  [place] [facility] SERVICES [address] [place], [place], [address] Phone: [phone]   Fax:  [phone]  Name: Dawn Hamilton MRN: [id number] Date of Birth: March 04, 1976

## 2020-12-29 ENCOUNTER — Ambulatory Visit: Payer: 59

## 2021-01-10 ENCOUNTER — Ambulatory Visit: Payer: 59

## 2021-01-24 ENCOUNTER — Ambulatory Visit: Payer: 59

## 2021-03-02 IMAGING — MG MM DIGITAL SCREENING BILAT W/ TOMO W/ CAD
8 series · 8 of 24 positions shown · non-contrast
Comparison: Previous exam(s).

CLINICAL DATA: Screening.

EXAM:
DIGITAL SCREENING BILATERAL MAMMOGRAM WITH TOMO AND CAD

[R CC synth-2D]
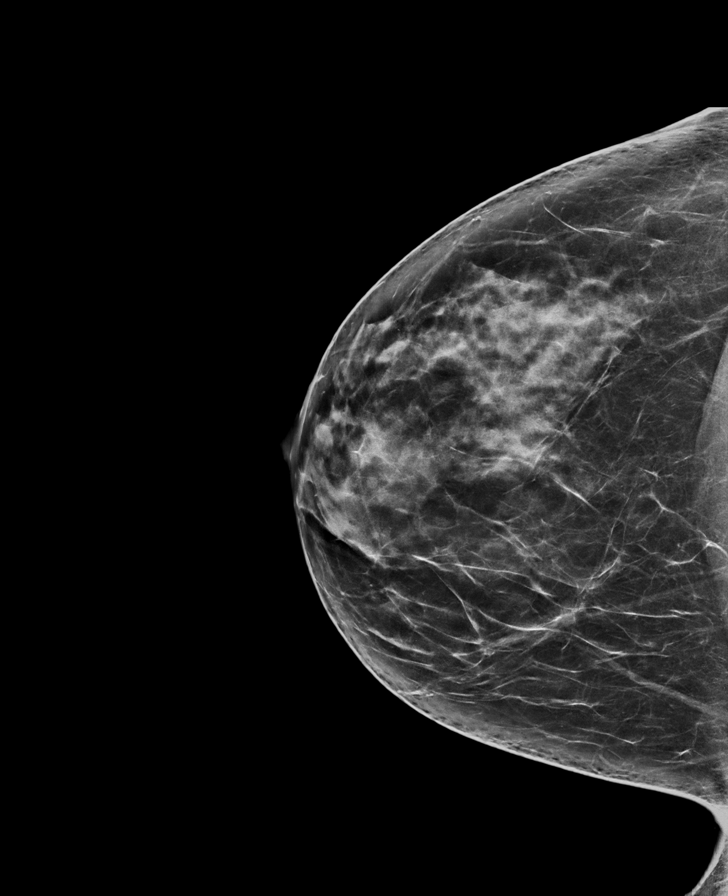

[L MLO synth-2D]
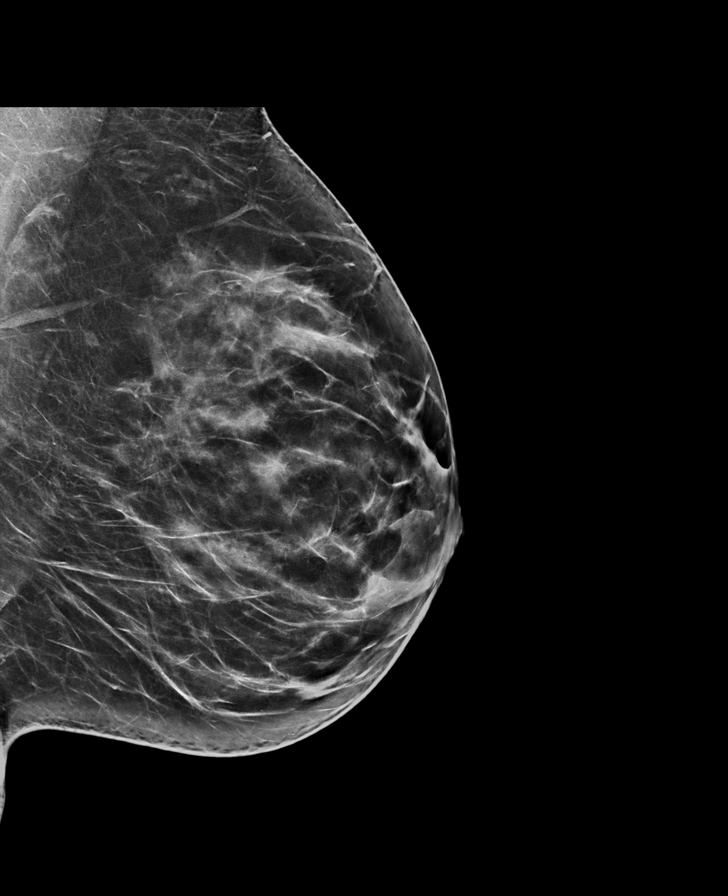

[L CC synth-2D]
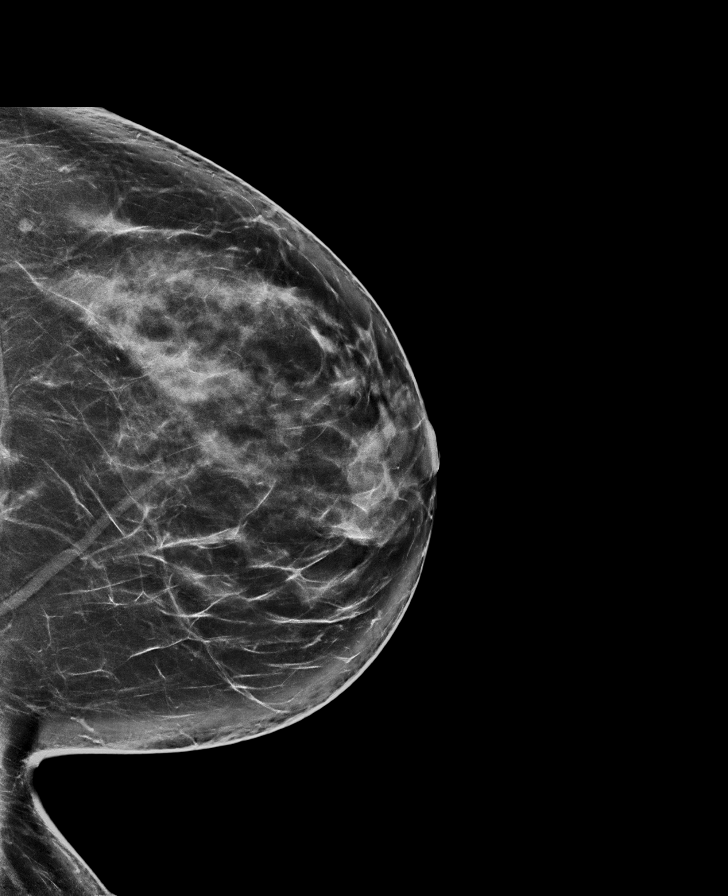

[R MLO synth-2D]
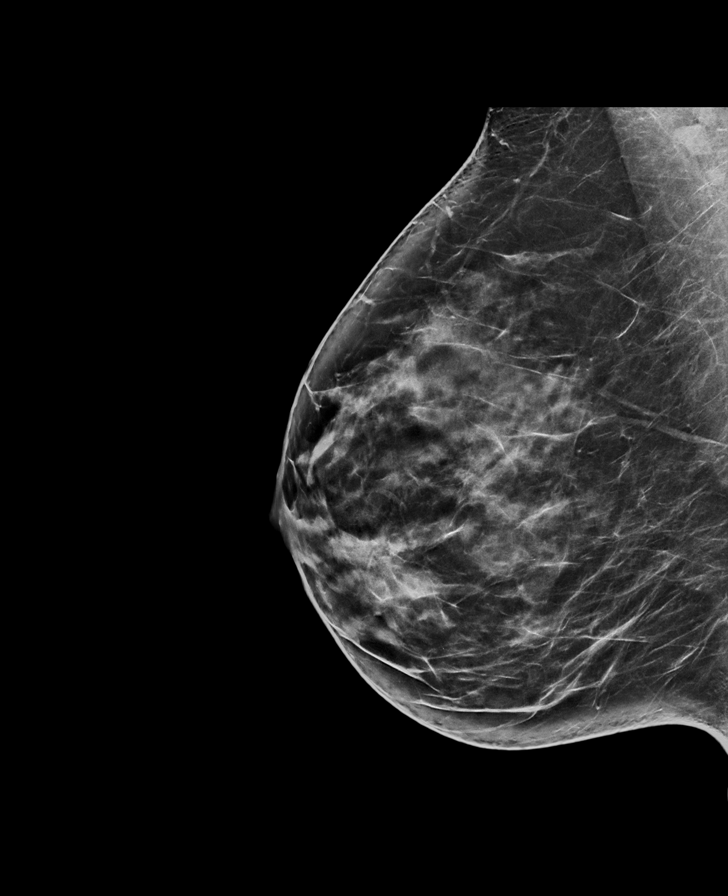

[R MLO tomo · tomo slice 41/80.0]
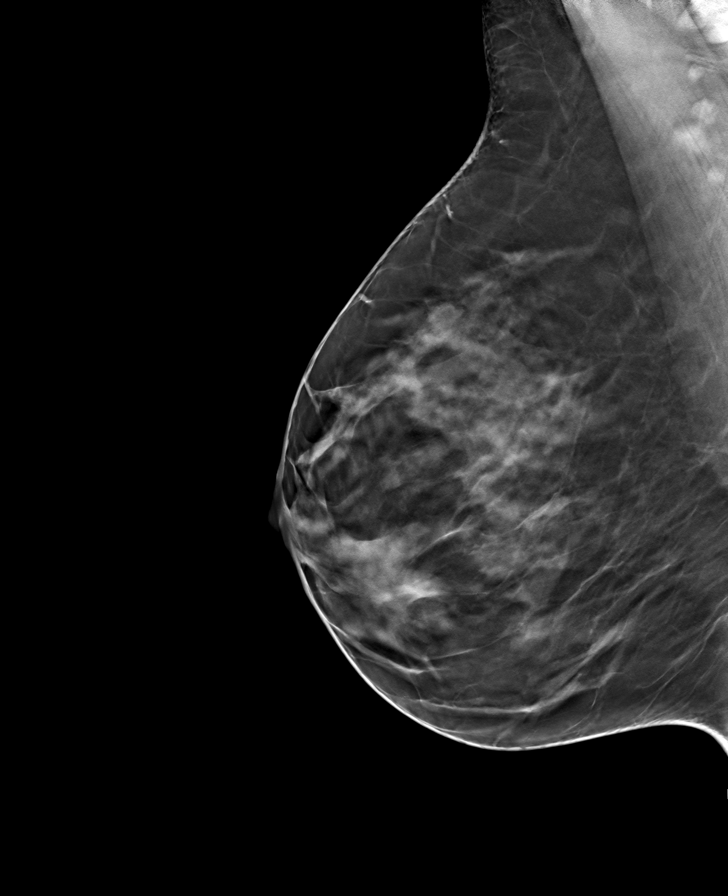

[L CC tomo · tomo slice 43/85.0]
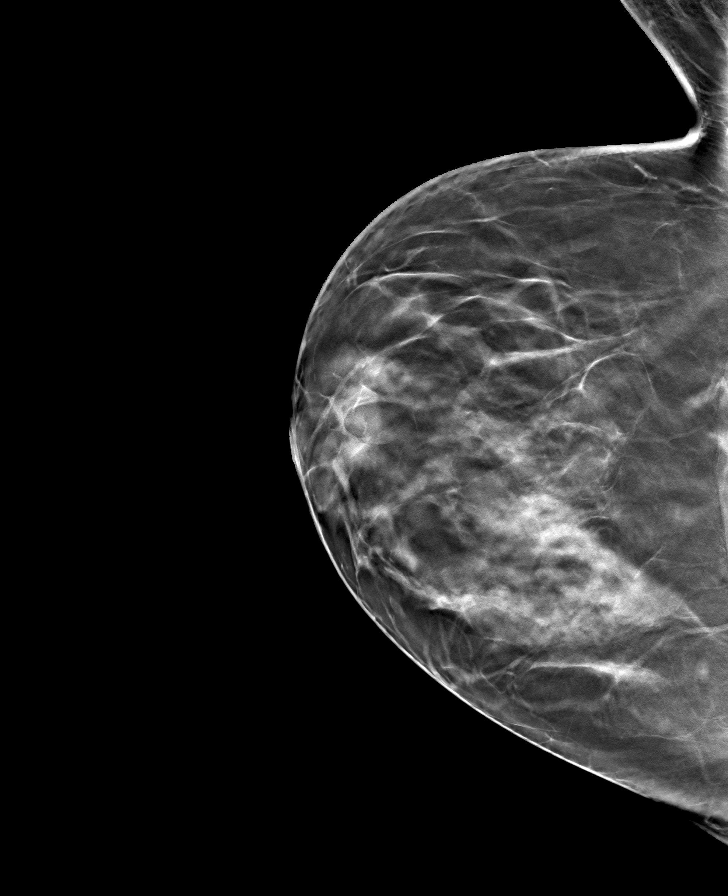

[R CC tomo · tomo slice 41/80.0]
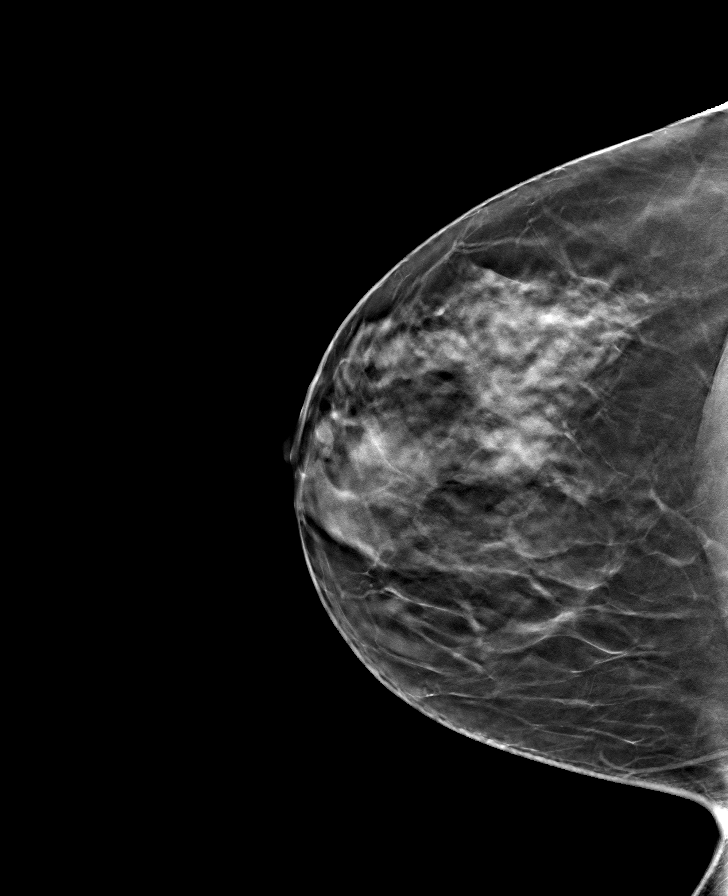

[L MLO tomo · tomo slice 41/82.0]
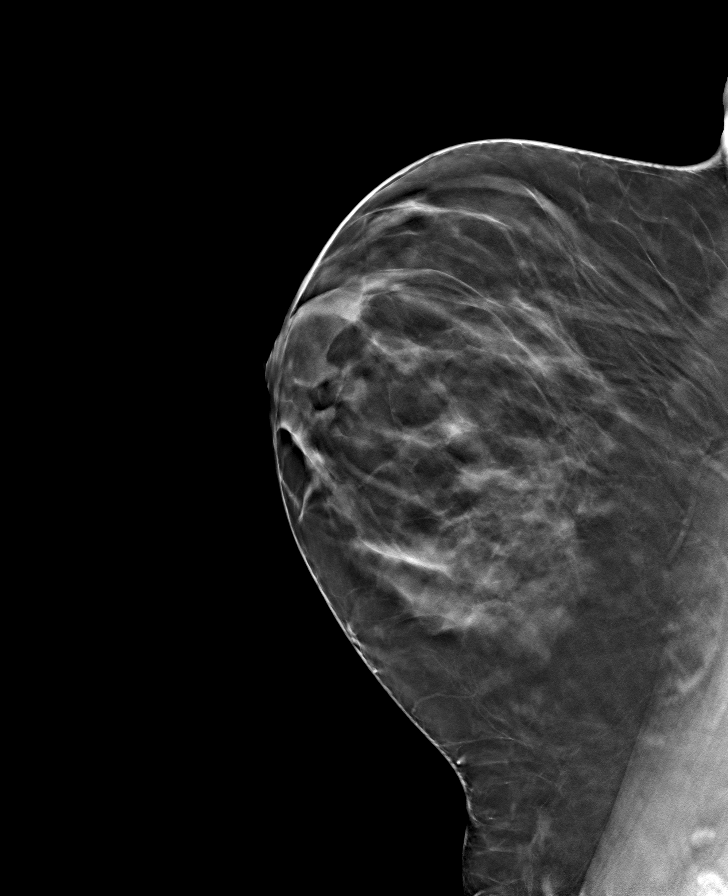

[8 of 24 positions shown; findings below may reference images not displayed]

ACR Breast Density Category c: The breast tissue is heterogeneously
dense, which may obscure small masses.
FINDINGS: There are no findings suspicious for malignancy. Images were
processed with CAD.
IMPRESSION: No mammographic evidence of malignancy. A result letter of this
screening mammogram will be mailed directly to the patient.

RECOMMENDATION:
Screening mammogram in one year. (Code:[id number])

BI-RADS CATEGORY  1: Negative.

## 2021-05-17 IMAGING — RF DG KNEE 1-2V*R*
1 series · 2 of 2 positions shown · non-contrast
Comparison: knee MRI 08/26/2019

CLINICAL DATA: Right knee ligament repair

EXAM:
DG C-ARM 1-60 MIN; RIGHT KNEE - 1-2 VIEW

[Series 1: run · 2 of 2 slices shown]
[im 1/2]
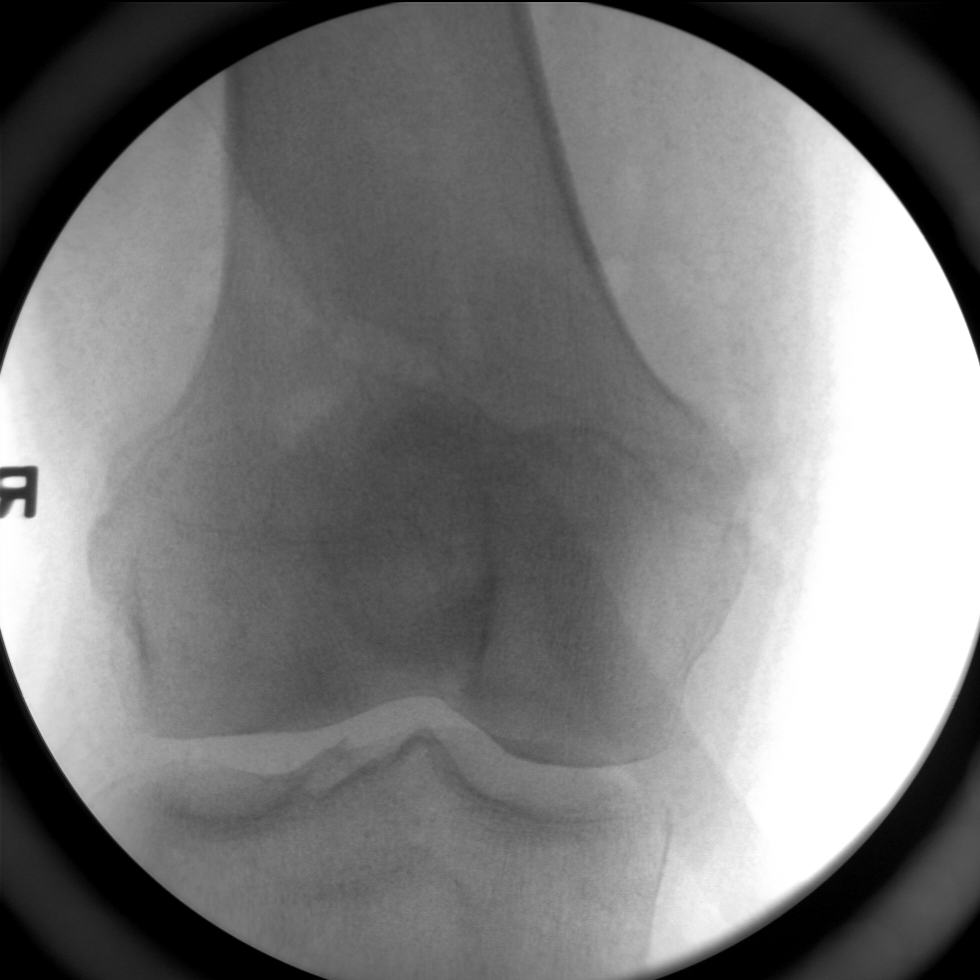
[im 2/2]
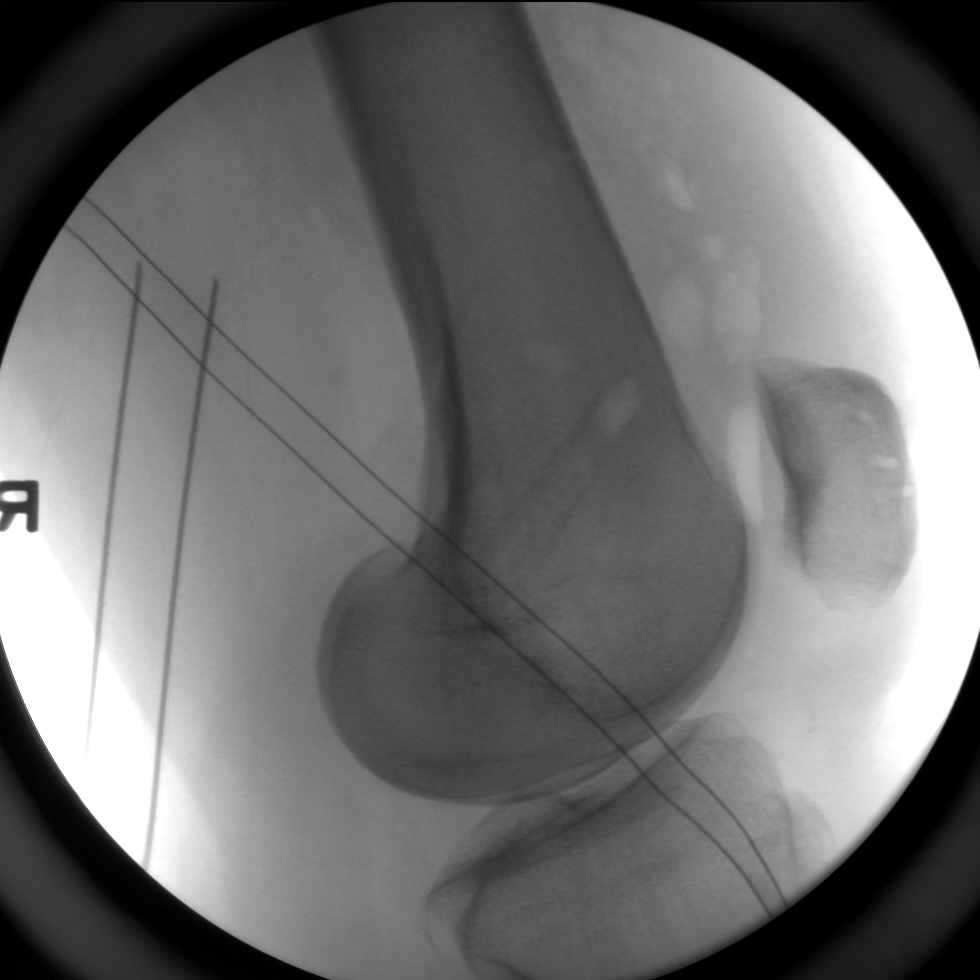

[2 of 2 positions shown; findings below may reference images not displayed]

FINDINGS: Intraoperative fluoroscopy of the right knee demonstrates
intraoperative changes of femoral tunnel placement and patellar
instrumentation compatible with reported the medial patellofemoral
ligamentous repair. No acute intraoperative complication. Expected
soft tissue gas.
IMPRESSION: Intraoperative appearance of a medial patellofemoral ligamentous
repair.

## 2021-06-30 ENCOUNTER — Other Ambulatory Visit: Payer: Self-pay | Admitting: Primary Care

## 2021-06-30 DIAGNOSIS — Z1231 Encounter for screening mammogram for malignant neoplasm of breast: Secondary | ICD-10-CM

## 2021-08-03 ENCOUNTER — Other Ambulatory Visit: Payer: Self-pay

## 2021-08-03 ENCOUNTER — Ambulatory Visit
Admission: RE | Admit: 2021-08-03 | Discharge: 2021-08-03 | Disposition: A | Discharge status: Home / Self Care | Payer: 59 | Source: Ambulatory Visit | Attending: Primary Care | Admitting: Primary Care

## 2021-08-03 DIAGNOSIS — Z1231 Encounter for screening mammogram for malignant neoplasm of breast: Secondary | ICD-10-CM | POA: Insufficient documentation

## 2021-08-30 DIAGNOSIS — D2261 Melanocytic nevi of right upper limb, including shoulder: Secondary | ICD-10-CM | POA: Diagnosis not present

## 2021-08-30 DIAGNOSIS — D2271 Melanocytic nevi of right lower limb, including hip: Secondary | ICD-10-CM | POA: Diagnosis not present

## 2021-08-30 DIAGNOSIS — L814 Other melanin hyperpigmentation: Secondary | ICD-10-CM | POA: Diagnosis not present

## 2021-08-30 DIAGNOSIS — H524 Presbyopia: Secondary | ICD-10-CM | POA: Diagnosis not present

## 2021-08-30 DIAGNOSIS — D225 Melanocytic nevi of trunk: Secondary | ICD-10-CM | POA: Diagnosis not present

## 2021-08-30 DIAGNOSIS — D2272 Melanocytic nevi of left lower limb, including hip: Secondary | ICD-10-CM | POA: Diagnosis not present

## 2021-08-30 DIAGNOSIS — D2262 Melanocytic nevi of left upper limb, including shoulder: Secondary | ICD-10-CM | POA: Diagnosis not present

## 2021-08-30 DIAGNOSIS — B078 Other viral warts: Secondary | ICD-10-CM | POA: Diagnosis not present

## 2021-08-30 DIAGNOSIS — R238 Other skin changes: Secondary | ICD-10-CM | POA: Diagnosis not present

## 2021-08-30 DIAGNOSIS — D485 Neoplasm of uncertain behavior of skin: Secondary | ICD-10-CM | POA: Diagnosis not present

## 2021-09-08 ENCOUNTER — Encounter: Payer: Self-pay | Admitting: Oncology

## 2021-12-15 ENCOUNTER — Telehealth: Payer: Self-pay | Admitting: Primary Care

## 2021-12-15 ENCOUNTER — Encounter: Payer: Self-pay | Admitting: Oncology

## 2021-12-22 ENCOUNTER — Encounter: Payer: 59 | Admitting: Primary Care

## 2022-01-06 DIAGNOSIS — D2272 Melanocytic nevi of left lower limb, including hip: Secondary | ICD-10-CM | POA: Diagnosis not present

## 2022-01-11 ENCOUNTER — Encounter: Payer: Self-pay | Admitting: Oncology

## 2022-01-12 ENCOUNTER — Encounter: Payer: 59 | Admitting: Primary Care

## 2022-02-24 ENCOUNTER — Other Ambulatory Visit (HOSPITAL_COMMUNITY)
Admission: RE | Admit: 2022-02-24 | Discharge: 2022-02-24 | Disposition: A | Discharge status: Home / Self Care | Payer: 59 | Source: Ambulatory Visit | Attending: Primary Care | Admitting: Primary Care

## 2022-02-24 ENCOUNTER — Ambulatory Visit (INDEPENDENT_AMBULATORY_CARE_PROVIDER_SITE_OTHER): Payer: 59 | Admitting: Primary Care

## 2022-02-24 ENCOUNTER — Encounter: Payer: Self-pay | Admitting: Primary Care

## 2022-02-24 VITALS — BP 118/64 | HR 88 | Temp 98.6°F | Ht 68.75 in | Wt 179.0 lb

## 2022-02-24 DIAGNOSIS — Z0000 Encounter for general adult medical examination without abnormal findings: Secondary | ICD-10-CM

## 2022-02-24 DIAGNOSIS — J302 Other seasonal allergic rhinitis: Secondary | ICD-10-CM

## 2022-02-24 DIAGNOSIS — Z124 Encounter for screening for malignant neoplasm of cervix: Secondary | ICD-10-CM | POA: Diagnosis not present

## 2022-02-24 DIAGNOSIS — D509 Iron deficiency anemia, unspecified: Secondary | ICD-10-CM | POA: Diagnosis not present

## 2022-02-24 DIAGNOSIS — E538 Deficiency of other specified B group vitamins: Secondary | ICD-10-CM | POA: Diagnosis not present

## 2022-02-24 LAB — LIPID PANEL
Cholesterol: 185 mg/dL (ref 0–200)
HDL: 82.3 mg/dL (ref >39.00)
LDL Cholesterol: 87 mg/dL (ref 0–99)
NonHDL: 102.41
Total CHOL/HDL Ratio: 2
Triglycerides: 76 mg/dL (ref 0.0–149.0)
VLDL: 15.2 mg/dL (ref 0.0–40.0)

## 2022-02-24 LAB — COMPREHENSIVE METABOLIC PANEL
ALT: 7 U/L (ref 0–35)
AST: 15 U/L (ref 0–37)
Albumin: 4 g/dL (ref 3.5–5.2)
Alkaline Phosphatase: 75 U/L (ref 39–117)
BUN: 15 mg/dL (ref 6–23)
CO2: 28 mEq/L (ref 19–32)
Calcium: 9 mg/dL (ref 8.4–10.5)
Chloride: 103 mEq/L (ref 96–112)
Creatinine, Ser: 0.92 mg/dL (ref 0.40–1.20)
GFR: 75.9 mL/min (ref >60.00)
Glucose, Bld: 88 mg/dL (ref 70–99)
Potassium: 4.5 mEq/L (ref 3.5–5.1)
Sodium: 136 mEq/L (ref 135–145)
Total Bilirubin: 0.5 mg/dL (ref 0.2–1.2)
Total Protein: 6.1 g/dL (ref 6.0–8.3)

## 2022-02-24 LAB — CBC
HCT: 39.5 % (ref 36.0–46.0)
Hemoglobin: 12.8 g/dL (ref 12.0–15.0)
MCHC: 32.3 g/dL (ref 30.0–36.0)
MCV: 80.1 fl (ref 78.0–100.0)
Platelets: 307 10*3/uL (ref 150.0–400.0)
RBC: 4.94 Mil/uL (ref 3.87–5.11)
RDW: 16.2 % — ABNORMAL HIGH (ref 11.5–15.5)
WBC: 5.7 10*3/uL (ref 4.0–10.5)

## 2022-02-24 LAB — IBC + FERRITIN
Ferritin: 5.8 ng/mL — ABNORMAL LOW (ref 10.0–291.0)
Iron: 61 ug/dL (ref 42–145)
Saturation Ratios: 15.4 % — ABNORMAL LOW (ref 20.0–50.0)
TIBC: 396.2 ug/dL (ref 250.0–450.0)
Transferrin: 283 mg/dL (ref 212.0–360.0)

## 2022-02-24 LAB — VITAMIN B12: Vitamin B-12: 222 pg/mL (ref 211–911)

## 2022-02-24 NOTE — Assessment & Plan Note (Signed)
Not taking supplementation. ?Repeat level pending today. ?

## 2022-02-24 NOTE — Patient Instructions (Signed)
Stop by the lab prior to leaving today. I will notify you of your results once received.   It was a pleasure to see you today!  Preventive Care 40-44 Years Old, Female Preventive care refers to lifestyle choices and visits with your health care provider that can promote health and wellness. Preventive care visits are also called wellness exams. What can I expect for my preventive care visit? Counseling Your health care provider may ask you questions about your: Medical history, including: Past medical problems. Family medical history. Pregnancy history. Current health, including: Menstrual cycle. Method of birth control. Emotional well-being. Home life and relationship well-being. Sexual activity and sexual health. Lifestyle, including: Alcohol, nicotine or tobacco, and drug use. Access to firearms. Diet, exercise, and sleep habits. Work and work environment. Sunscreen use. Safety issues such as seatbelt and bike helmet use. Physical exam Your health care provider will check your: Height and weight. These may be used to calculate your BMI (body mass index). BMI is a measurement that tells if you are at a healthy weight. Waist circumference. This measures the distance around your waistline. This measurement also tells if you are at a healthy weight and may help predict your risk of certain diseases, such as type 2 diabetes and high blood pressure. Heart rate and blood pressure. Body temperature. Skin for abnormal spots. What immunizations do I need?  Vaccines are usually given at various ages, according to a schedule. Your health care provider will recommend vaccines for you based on your age, medical history, and lifestyle or other factors, such as travel or where you work. What tests do I need? Screening Your health care provider may recommend screening tests for certain conditions. This may include: Lipid and cholesterol levels. Diabetes screening. This is done by checking  your blood sugar (glucose) after you have not eaten for a while (fasting). Pelvic exam and Pap test. Hepatitis B test. Hepatitis C test. HIV (human immunodeficiency virus) test. STI (sexually transmitted infection) testing, if you are at risk. Lung cancer screening. Colorectal cancer screening. Mammogram. Talk with your health care provider about when you should start having regular mammograms. This may depend on whether you have a family history of breast cancer. BRCA-related cancer screening. This may be done if you have a family history of breast, ovarian, tubal, or peritoneal cancers. Bone density scan. This is done to screen for osteoporosis. Talk with your health care provider about your test results, treatment options, and if necessary, the need for more tests. Follow these instructions at home: Eating and drinking  Eat a diet that includes fresh fruits and vegetables, whole grains, lean protein, and low-fat dairy products. Take vitamin and mineral supplements as recommended by your health care provider. Do not drink alcohol if: Your health care provider tells you not to drink. You are pregnant, may be pregnant, or are planning to become pregnant. If you drink alcohol: Limit how much you have to 0-1 drink a day. Know how much alcohol is in your drink. In the [place] one drink equals one 12 oz bottle of beer (355 mL), one 5 oz glass of wine (148 mL), or one 1 oz glass of hard liquor (44 mL). Lifestyle Brush your teeth every morning and night with fluoride toothpaste. Floss one time each day. Exercise for at least 30 minutes 5 or more days each week. Do not use any products that contain nicotine or tobacco. These products include cigarettes, chewing tobacco, and vaping devices, such as e-cigarettes. If you need   help quitting, ask your health care provider. Do not use drugs. If you are sexually active, practice safe sex. Use a condom or other form of protection to prevent STIs. If you  do not wish to become pregnant, use a form of birth control. If you plan to become pregnant, see your health care provider for a prepregnancy visit. Take aspirin only as told by your health care provider. Make sure that you understand how much to take and what form to take. Work with your health care provider to find out whether it is safe and beneficial for you to take aspirin daily. Find healthy ways to manage stress, such as: Meditation, yoga, or listening to music. Journaling. Talking to a trusted person. Spending time with friends and family. Minimize exposure to UV radiation to reduce your risk of skin cancer. Safety Always wear your seat belt while driving or riding in a vehicle. Do not drive: If you have been drinking alcohol. Do not ride with someone who has been drinking. When you are tired or distracted. While texting. If you have been using any mind-altering substances or drugs. Wear a helmet and other protective equipment during sports activities. If you have firearms in your house, make sure you follow all gun safety procedures. Seek help if you have been physically or sexually abused. What's next? Visit your health care provider once a year for an annual wellness visit. Ask your health care provider how often you should have your eyes and teeth checked. Stay up to date on all vaccines. This information is not intended to replace advice given to you by your health care provider. Make sure you discuss any questions you have with your health care provider. Document Revised: 04/27/2021 Document Reviewed: 04/27/2021 Elsevier Patient Education  2023 Elsevier Inc.  

## 2022-02-24 NOTE — Progress Notes (Signed)
? ?Subjective:  ? ? Patient ID: Dawn Hamilton, female    DOB: [birth year], 46 y.o.   MRN: [id number] ? ?HPI ? ?Dawn Hamilton is a very pleasant 46 y.o. female who presents today for complete physical and follow up of chronic conditions. ? ?Immunizations: ?-Tetanus: 2022 ?-Influenza: Completed last season ?-Covid-19: 3 vaccines ? ?Diet: Fair diet.  ?Exercise: 2-3 days weekly ? ?Eye exam: Completes annually  ?Dental exam: Completes semi-annually  ? ?Pap Smear: Completed in 2019 ?Mammogram: Completed in September 2022 ? ?BP Readings from Last 3 Encounters:  ?02/24/22 118/64  ?12/21/20 120/74  ?09/23/20 118/75  ? ?Wt Readings from Last 3 Encounters:  ?02/24/22 179 lb (81.2 kg)  ?12/21/20 198 lb (89.8 kg)  ?09/23/20 188 lb 15 oz (85.7 kg)  ? ? ? ? ? ?Review of Systems  ?Constitutional:  Negative for unexpected weight change.  ?HENT:  Negative for rhinorrhea.   ?Respiratory:  Negative for cough and shortness of breath.   ?Cardiovascular:  Negative for chest pain.  ?Gastrointestinal:  Negative for constipation and diarrhea.  ?Genitourinary:  Negative for difficulty urinating and menstrual problem.  ?Musculoskeletal:  Negative for arthralgias and myalgias.  ?Skin:  Negative for rash.  ?Allergic/Immunologic: Negative for environmental allergies.  ?Neurological:  Negative for dizziness and headaches.  ?Psychiatric/Behavioral:  The patient is not nervous/anxious.   ? ?   ? ? ?Past Medical History:  ?Diagnosis Date  ? Allergy   ? Anxiety   ? Depression   ? Precancerous skin lesion   ? ? ?Social History  ? ?Socioeconomic History  ? Marital status: Married  ?  Spouse name: Not on file  ? Number of children: Not on file  ? Years of education: Not on file  ? Highest education level: Not on file  ?Occupational History  ? Not on file  ?Tobacco Use  ? Smoking status: Never  ? Smokeless tobacco: Never  ?Vaping Use  ? Vaping Use: Never used  ?Substance and Sexual Activity  ? Alcohol use: Yes  ? Drug use: Never  ? Sexual  activity: Not on file  ?Other Topics Concern  ? Not on file  ?Social History Narrative  ? Married.  ? 2 children.  ? Works as a Psychologist, forensic.  ? Enjoys spending time outdoors, going to [place] lake.  ? ?Social Determinants of Health  ? ?Financial Resource Strain: Not on file  ?Food Insecurity: Not on file  ?Transportation Needs: Not on file  ?Physical Activity: Not on file  ?Stress: Not on file  ?Social Connections: Not on file  ?Intimate Partner Violence: Not on file  ? ? ?Past Surgical History:  ?Procedure Laterality Date  ? KNEE ARTHROSCOPY WITH MEDIAL COLLATERAL LIGAMENT RECONSTRUCTION Right 09/23/2020  ? Procedure: RIGHT KNEE ARTHROSCOPY DEBRIDMENT  WITH MEDIAL COLLATERAL LIGAMENT RECONSTRUCTION;  Surgeon: Hiram Gash, MD;  Location: [place];  Service: Orthopedics;  Laterality: Right;  ? KNEE ARTHROSCOPY WITH MEDIAL PATELLAR FEMORAL LIGAMENT RECONSTRUCTION Right 10/02/2019  ? Procedure: KNEE ARTHROSCOPY WITH MEDIAL PATELLAR FEMORAL LIGAMENT RECONSTRUCTION WITH ALLOGRAPH;  Surgeon: Hiram Gash, MD;  Location: [place];  Service: Orthopedics;  Laterality: Right;  ? KNEE SURGERY    ? x3  ? ? ?Family History  ?Problem Relation Age of Onset  ? Healthy Mother   ? Healthy Father   ? Colon cancer Maternal Grandfather   ? Diabetes Maternal Grandfather   ? Bladder Cancer Paternal Grandmother   ? Breast cancer Neg Hx   ? ? ?  Allergies  ?Allergen Reactions  ? Codeine Nausea Only  ? Wound Dressing Adhesive   ? Benzoin Rash  ? Iodine Rash  ? ? ?Current Outpatient Medications on File Prior to Visit  ?Medication Sig Dispense Refill  ? cholecalciferol (VITAMIN D3) 25 MCG (1000 UNIT) tablet Take 1,000 Units by mouth daily.    ? fluticasone (FLONASE) 50 MCG/ACT nasal spray Place 2 sprays into both nostrils daily.    ? loratadine (CLARITIN) 10 MG tablet Take 10 mg by mouth daily as needed for allergies.    ? ?No current facility-administered medications on file prior to visit.  ? ? ?BP 118/64    Pulse 88   Temp 98.6 ?F (37 ?C) (Oral)   Ht 5' 8.75" (1.746 m)   Wt 179 lb (81.2 kg)   SpO2 98%   BMI 26.63 kg/m?  ?Objective:  ? Physical Exam ?HENT:  ?   Right Ear: Tympanic membrane and ear canal normal.  ?   Left Ear: Tympanic membrane and ear canal normal.  ?   Nose: Nose normal.  ?Eyes:  ?   Conjunctiva/sclera: Conjunctivae normal.  ?   Pupils: Pupils are equal, round, and reactive to light.  ?Neck:  ?   Thyroid: No thyromegaly.  ?Cardiovascular:  ?   Rate and Rhythm: Normal rate and regular rhythm.  ?   Heart sounds: No murmur heard. ?Pulmonary:  ?   Effort: Pulmonary effort is normal.  ?   Breath sounds: Normal breath sounds. No rales.  ?Abdominal:  ?   General: Bowel sounds are normal.  ?   Palpations: Abdomen is soft.  ?   Tenderness: There is no abdominal tenderness.  ?Genitourinary: ?   Labia:     ?   Right: No tenderness or lesion.     ?   Left: No tenderness or lesion.   ?   Vagina: Normal.  ?   Cervix: No cervical motion tenderness or discharge.  ?   Uterus: Normal.   ?   Adnexa: Right adnexa normal and left adnexa normal.  ?Musculoskeletal:     ?   General: Normal range of motion.  ?   Cervical back: Neck supple.  ?Lymphadenopathy:  ?   Cervical: No cervical adenopathy.  ?Skin: ?   General: Skin is warm and dry.  ?   Findings: No rash.  ?Neurological:  ?   Mental Status: She is alert and oriented to person, place, and time.  ?   Cranial Nerves: No cranial nerve deficit.  ?   Deep Tendon Reflexes: Reflexes are normal and symmetric.  ?Psychiatric:     ?   Mood and Affect: Mood normal.  ? ? ? ? ? ?   ?Assessment & Plan:  ? ? ? ? ?This visit occurred during the SARS-CoV-2 public health emergency.  Safety protocols were in place, including screening questions prior to the visit, additional usage of staff PPE, and extensive cleaning of exam room while observing appropriate contact time as indicated for disinfecting solutions.  ?

## 2022-02-24 NOTE — Assessment & Plan Note (Signed)
Immunizations UTD. ?Pap smear due, completed today. ? ?Discussed the importance of a healthy diet and regular exercise in order for weight loss, and to reduce the risk of further co-morbidity. ? ?Exam today stable. ?Labs pending.  ?

## 2022-02-24 NOTE — Assessment & Plan Note (Signed)
Controlled. ? ?Continue Flonase and Claritin 10 mg daily PRN. ? ?

## 2022-02-24 NOTE — Assessment & Plan Note (Signed)
Repeat iron studies pending. ?Not taking supplementation.  ?

## 2022-02-26 ENCOUNTER — Other Ambulatory Visit: Payer: Self-pay | Admitting: Primary Care

## 2022-02-26 DIAGNOSIS — D509 Iron deficiency anemia, unspecified: Secondary | ICD-10-CM

## 2022-02-27 ENCOUNTER — Telehealth: Payer: Self-pay | Admitting: Adult Health

## 2022-02-27 LAB — CYTOLOGY - PAP
Adequacy: ABSENT
Comment: NEGATIVE
Diagnosis: NEGATIVE
High risk HPV: NEGATIVE

## 2022-02-27 NOTE — Telephone Encounter (Signed)
Scheduled appt per 4/14 referral. Pt is aware of appt date and time. Pt is aware to arrive 15 mins prior to appt time and to bring and updated insurance card. Pt is aware of appt location.   ?

## 2022-03-02 ENCOUNTER — Other Ambulatory Visit: Payer: Self-pay

## 2022-03-02 ENCOUNTER — Inpatient Hospital Stay: Payer: 59

## 2022-03-02 ENCOUNTER — Encounter: Payer: Self-pay | Admitting: Adult Health

## 2022-03-02 ENCOUNTER — Inpatient Hospital Stay: Payer: 59 | Attending: Adult Health | Admitting: Adult Health

## 2022-03-02 VITALS — BP 151/90 | HR 97 | Temp 97.7°F | Resp 19 | Ht 68.75 in | Wt 181.5 lb

## 2022-03-02 DIAGNOSIS — Z800 Family history of malignant neoplasm of digestive organs: Secondary | ICD-10-CM | POA: Diagnosis not present

## 2022-03-02 DIAGNOSIS — N920 Excessive and frequent menstruation with regular cycle: Secondary | ICD-10-CM | POA: Diagnosis not present

## 2022-03-02 DIAGNOSIS — E611 Iron deficiency: Secondary | ICD-10-CM | POA: Diagnosis not present

## 2022-03-02 DIAGNOSIS — E538 Deficiency of other specified B group vitamins: Secondary | ICD-10-CM

## 2022-03-02 DIAGNOSIS — Z8052 Family history of malignant neoplasm of bladder: Secondary | ICD-10-CM | POA: Diagnosis not present

## 2022-03-02 DIAGNOSIS — D500 Iron deficiency anemia secondary to blood loss (chronic): Secondary | ICD-10-CM | POA: Diagnosis not present

## 2022-03-02 NOTE — Progress Notes (Signed)
?Dawn Hamilton  ?[phone] [phone]  ? ? ? ?ID: Dawn Hamilton DOB: October 10, 1976  MR#: [id number]  WUX#:[id number] ? ?Patient Care Team: ?[name], NP as PCP - General (Internal Medicine) ?Scot Dock, NP ?OTHER MD: ? ?CHIEF COMPLAINT: iron deficiency ? ?CURRENT TREATMENT:  intermittent oral iron and oral b12 ? ? ?HISTORY OF CURRENT ILLNESS: ?Dawn Hamilton is here today to establish care and for second opinion of her fatigue and iron/b12 deficiency.   ? ?[name] established with Dr. Grayland Ormond in 02/2019 for iron deficiency.  She struggles with tolerating oral iron due to nausea.  She received IV Feraheme back in 05/2019 with good tolerance.  Her levels responded well.   ? ?She notes that for many years she has had a low normal vitamin b12 level and took oral b12.  In July 2020 she tested for low normal b12 level at 206, repeat 6 months later was 163 and 6 months after was 213.  She recalls that at some point in the summer of 2021 she underwent b12 injection with her PCP and her level rose to 1257.   ? ?INTERVAL HISTORY: ?Dawn Hamilton is here today for f/u of her iron and b12 deficiency.  She has been increasingly fatigued and her most recent lab testing with her PCP suggested low normal b12 level and decreased ferritin.  She does not tolerate oral iron and is here today for consideration of IV iron.   ? ?REVIEW OF SYSTEMS: ?Review of Systems  ?Constitutional:  Positive for fatigue. Negative for appetite change, chills, fever and unexpected weight change.  ?HENT:   Negative for hearing loss, lump/mass and trouble swallowing.   ?Eyes:  Negative for eye problems and icterus.  ?Respiratory:  Negative for chest tightness, cough and shortness of breath.   ?Cardiovascular:  Negative for chest pain, leg swelling and palpitations.  ?Gastrointestinal:  Negative for abdominal distention, abdominal pain, constipation, diarrhea, nausea and vomiting.  ?Endocrine: Negative for hot flashes.   ?Genitourinary:  Negative for difficulty urinating.   ?Musculoskeletal:  Negative for arthralgias.  ?Skin:  Negative for itching and rash.  ?Neurological:  Negative for dizziness, extremity weakness, headaches and numbness.  ?Hematological:  Negative for adenopathy. Does not bruise/bleed easily.  ?Psychiatric/Behavioral:  Negative for depression. The patient is not nervous/anxious.   ? ? ?PAST MEDICAL HISTORY: ?Past Medical History:  ?Diagnosis Date  ? Allergy   ? Anxiety   ? Depression   ? Precancerous skin lesion   ? ? ?PAST SURGICAL HISTORY: ?Past Surgical History:  ?Procedure Laterality Date  ? KNEE ARTHROSCOPY WITH MEDIAL COLLATERAL LIGAMENT RECONSTRUCTION Right 09/23/2020  ? Procedure: RIGHT KNEE ARTHROSCOPY DEBRIDMENT  WITH MEDIAL COLLATERAL LIGAMENT RECONSTRUCTION;  Surgeon: Hiram Gash, MD;  Location: [place];  Service: Orthopedics;  Laterality: Right;  ? KNEE ARTHROSCOPY WITH MEDIAL PATELLAR FEMORAL LIGAMENT RECONSTRUCTION Right 10/02/2019  ? Procedure: KNEE ARTHROSCOPY WITH MEDIAL PATELLAR FEMORAL LIGAMENT RECONSTRUCTION WITH ALLOGRAPH;  Surgeon: Hiram Gash, MD;  Location: [facility];  Service: Orthopedics;  Laterality: Right;  ? KNEE SURGERY    ? x3  ? ? ?FAMILY HISTORY ?Family History  ?Problem Relation Age of Onset  ? Healthy Mother   ? Healthy Father   ? Colon cancer Maternal Grandfather   ? Diabetes Maternal Grandfather   ? Bladder Cancer Paternal Grandmother   ? Breast cancer Neg Hx   ? ? ?GYNECOLOGIC HISTORY:  ?Menarche: 46 years old ?Age at first live birth: 46 years  old ?[place] P 2 ?LMP 02/25/2022 ?Contraceptive 3 years, stopped in 2007 ?HRT no  ?Hysterectomy? no ?Salpingo-oophorectomy? no ? ? ? ?SOCIAL HISTORY: Talena is married to [name] and lives in [place], [place] with her husband and two children [name] who is 60, and [name] who is 33.  ? ?  ?  ?ADVANCED DIRECTIVES: Not in place.  [name] is her next of kin.  His phone number is listed as  her Emergency Contact.  ? ? ?HEALTH MAINTENANCE: ?Social History  ? ?Tobacco Use  ? Smoking status: Never  ? Smokeless tobacco: Never  ?Vaping Use  ? Vaping Use: Never used  ?Substance Use Topics  ? Alcohol use: Yes  ? Drug use: Never  ? ? ? Colonoscopy: never ? Mammogram: 08/03/2021; breast density category C ? PAP:02/24/2022 ? Bone density: ?  ?Allergies  ?Allergen Reactions  ? Codeine Nausea Only  ? Wound Dressing Adhesive   ? Benzoin Rash  ? Iodine Rash  ? ? ?Current Outpatient Medications  ?Medication Sig Dispense Refill  ? cholecalciferol (VITAMIN D3) 25 MCG (1000 UNIT) tablet Take 1,000 Units by mouth daily.    ? fluticasone (FLONASE) 50 MCG/ACT nasal spray Place 2 sprays into both nostrils daily.    ? loratadine (CLARITIN) 10 MG tablet Take 10 mg by mouth daily as needed for allergies.    ? ?No current facility-administered medications for this visit.  ? ? ?OBJECTIVE: ? ?Vitals:  ? 03/02/22 1432  ?BP: (!) 151/90  ?Pulse: 97  ?Resp: 19  ?Temp: 97.7 ?F (36.5 ?C)  ?SpO2: 97%  ?   Body mass index is 27 kg/m?.   ?Wt Readings from Last 3 Encounters:  ?03/02/22 181 lb 8 oz (82.3 kg)  ?02/24/22 179 lb (81.2 kg)  ?12/21/20 198 lb (89.8 kg)  ? ? ?  ECOG FS:1 - Symptomatic but completely ambulatory ?GENERAL: Patient is a well appearing female in no acute distress ?HEENT:  Sclerae anicteric.  Oropharynx clear and moist. No ulcerations or evidence of oropharyngeal candidiasis. Neck is supple.  ?NODES:  No cervical, supraclavicular, or axillary lymphadenopathy palpated.  ?BREAST EXAM:  Deferred. ?LUNGS:  Clear to auscultation bilaterally.  No wheezes or rhonchi. ?HEART:  Regular rate and rhythm. No murmur appreciated. ?ABDOMEN:  Soft, nontender.  Positive, normoactive bowel sounds. No organomegaly palpated. ?MSK:  No focal spinal tenderness to palpation. Full range of motion bilaterally in the upper extremities. ?EXTREMITIES:  No peripheral edema.   ?SKIN:  Clear with no obvious rashes or skin changes. No nail  dyscrasia. ?NEURO:  Nonfocal. Well oriented.  Appropriate affect. ? ? ? ?LAB RESULTS: ? ? ?No visits with results within 3 Day(s) from this visit.  ?Latest known visit with results is:  ?Office Visit on 02/24/2022  ?Component Date Value Ref Range Status  ? Vitamin B-12 02/24/2022 222  211 - 911 pg/mL Final  ? WBC 02/24/2022 5.7  4.0 - 10.5 K/uL Final  ? RBC 02/24/2022 4.94  3.87 - 5.11 Mil/uL Final  ? Platelets 02/24/2022 307.0  150.0 - 400.0 K/uL Final  ? Hemoglobin 02/24/2022 12.8  12.0 - 15.0 g/dL Final  ? HCT 02/24/2022 39.5  36.0 - 46.0 % Final  ? MCV 02/24/2022 80.1  78.0 - 100.0 fl Final  ? MCHC 02/24/2022 32.3  30.0 - 36.0 g/dL Final  ? RDW 02/24/2022 16.2 (H)  11.5 - 15.5 % Final  ? Sodium 02/24/2022 136  135 - 145 mEq/L Final  ? Potassium 02/24/2022 4.5  3.5 -  5.1 mEq/L Final  ? Chloride 02/24/2022 103  96 - 112 mEq/L Final  ? CO2 02/24/2022 28  19 - 32 mEq/L Final  ? Glucose, Bld 02/24/2022 88  70 - 99 mg/dL Final  ? BUN 02/24/2022 15  6 - 23 mg/dL Final  ? Creatinine, Ser 02/24/2022 0.92  0.40 - 1.20 mg/dL Final  ? Total Bilirubin 02/24/2022 0.5  0.2 - 1.2 mg/dL Final  ? Alkaline Phosphatase 02/24/2022 75  39 - 117 U/L Final  ? AST 02/24/2022 15  0 - 37 U/L Final  ? ALT 02/24/2022 7  0 - 35 U/L Final  ? Total Protein 02/24/2022 6.1  6.0 - 8.3 g/dL Final  ? Albumin 02/24/2022 4.0  3.5 - 5.2 g/dL Final  ? GFR 02/24/2022 75.90  >60.00 mL/min Final  ? Calculated using the CKD-EPI Creatinine Equation (2021)  ? Calcium 02/24/2022 9.0  8.4 - 10.5 mg/dL Final  ? Cholesterol 02/24/2022 185  0 - 200 mg/dL Final  ? ATP III Classification       Desirable:  < 200 mg/dL               Borderline High:  200 - 239 mg/dL          High:  > = 240 mg/dL  ? Triglycerides 02/24/2022 76.0  0.0 - 149.0 mg/dL Final  ? Normal:  <150 mg/dLBorderline High:  150 - 199 mg/dL  ? HDL 02/24/2022 82.30  >39.00 mg/dL Final  ? VLDL 02/24/2022 15.2  0.0 - 40.0 mg/dL Final  ? LDL Cholesterol 02/24/2022 87  0 - 99 mg/dL Final  ? Total CHOL/HDL  Ratio 02/24/2022 2   Final  ?                Men          Women1/2 Average Risk     3.4          3.3Average Risk          5.0          4.42X Average Risk          9.6          7.13X Average Risk          15.0          1

## 2022-03-03 LAB — INTRINSIC FACTOR ANTIBODIES: Intrinsic Factor: 1 AU/mL (ref 0.0–1.1)

## 2022-03-03 LAB — ANTI-PARIETAL ANTIBODY: Parietal Cell Antibody-IgG: 39.4 Units — ABNORMAL HIGH (ref 0.0–20.0)

## 2022-03-05 ENCOUNTER — Encounter: Payer: Self-pay | Admitting: Adult Health

## 2022-03-05 MED ORDER — CYANOCOBALAMIN 1000 MCG/ML IJ SOLN
1000.0000 ug | INTRAMUSCULAR | 12 refills | Status: DC
  Filled 2022-03-05: qty 1, 30d supply, fill #0
  Filled 2022-04-03: qty 3, 90d supply, fill #1
  Filled 2022-06-02 – 2022-06-20 (×2): qty 3, 90d supply, fill #2
  Filled 2022-09-15: qty 3, 90d supply, fill #3
  Filled 2023-01-01: qty 3, 90d supply, fill #4

## 2022-03-06 ENCOUNTER — Telehealth: Payer: Self-pay | Admitting: Adult Health

## 2022-03-06 ENCOUNTER — Other Ambulatory Visit: Payer: Self-pay

## 2022-03-06 ENCOUNTER — Other Ambulatory Visit (HOSPITAL_COMMUNITY): Payer: Self-pay

## 2022-03-06 NOTE — Telephone Encounter (Signed)
.  Called patient to schedule appointment per 4/21 inbasket, patient is aware of date and time.   ?

## 2022-03-09 ENCOUNTER — Encounter: Payer: Self-pay | Admitting: Hematology and Oncology

## 2022-03-10 ENCOUNTER — Inpatient Hospital Stay: Payer: 59

## 2022-03-10 ENCOUNTER — Other Ambulatory Visit: Payer: Self-pay

## 2022-03-10 ENCOUNTER — Ambulatory Visit: Payer: 59

## 2022-03-10 VITALS — BP 122/84 | HR 64 | Temp 98.0°F | Resp 17

## 2022-03-10 DIAGNOSIS — D509 Iron deficiency anemia, unspecified: Secondary | ICD-10-CM

## 2022-03-10 DIAGNOSIS — N920 Excessive and frequent menstruation with regular cycle: Secondary | ICD-10-CM | POA: Diagnosis not present

## 2022-03-10 DIAGNOSIS — E538 Deficiency of other specified B group vitamins: Secondary | ICD-10-CM | POA: Diagnosis not present

## 2022-03-10 DIAGNOSIS — Z8052 Family history of malignant neoplasm of bladder: Secondary | ICD-10-CM | POA: Diagnosis not present

## 2022-03-10 DIAGNOSIS — Z800 Family history of malignant neoplasm of digestive organs: Secondary | ICD-10-CM | POA: Diagnosis not present

## 2022-03-10 DIAGNOSIS — E611 Iron deficiency: Secondary | ICD-10-CM | POA: Diagnosis not present

## 2022-03-10 MED ORDER — SODIUM CHLORIDE 0.9 % IV SOLN: Freq: Once | INTRAVENOUS | Status: AC

## 2022-03-10 MED ORDER — SODIUM CHLORIDE 0.9 % IV SOLN
300.0000 mg | Freq: Once | INTRAVENOUS | Status: AC
  Administered 2022-03-10: 300 mg via INTRAVENOUS
  Filled 2022-03-10: qty 300

## 2022-03-10 NOTE — Patient Instructions (Signed)

## 2022-03-16 ENCOUNTER — Other Ambulatory Visit: Payer: Self-pay

## 2022-03-16 ENCOUNTER — Other Ambulatory Visit: Payer: Self-pay | Admitting: Primary Care

## 2022-03-16 ENCOUNTER — Telehealth: Payer: Self-pay | Admitting: Urgent Care

## 2022-03-16 DIAGNOSIS — J329 Chronic sinusitis, unspecified: Secondary | ICD-10-CM

## 2022-03-16 DIAGNOSIS — D509 Iron deficiency anemia, unspecified: Secondary | ICD-10-CM

## 2022-03-16 MED ORDER — AMOXICILLIN-POT CLAVULANATE 875-125 MG PO TABS
1.0000 | ORAL_TABLET | Freq: Two times a day (BID) | ORAL | 0 refills | Status: AC
  Filled 2022-03-16: qty 14, 7d supply, fill #0

## 2022-03-16 NOTE — Progress Notes (Signed)

## 2022-03-17 ENCOUNTER — Inpatient Hospital Stay: Payer: 59 | Attending: Adult Health

## 2022-03-17 ENCOUNTER — Other Ambulatory Visit: Payer: Self-pay

## 2022-03-17 VITALS — BP 130/90 | HR 59 | Temp 98.2°F | Resp 18 | Ht 68.75 in | Wt 179.1 lb

## 2022-03-17 DIAGNOSIS — E611 Iron deficiency: Secondary | ICD-10-CM | POA: Diagnosis not present

## 2022-03-17 DIAGNOSIS — D509 Iron deficiency anemia, unspecified: Secondary | ICD-10-CM

## 2022-03-17 MED ORDER — SODIUM CHLORIDE 0.9 % IV SOLN
300.0000 mg | Freq: Once | INTRAVENOUS | Status: AC
  Administered 2022-03-17: 300 mg via INTRAVENOUS
  Filled 2022-03-17: qty 300

## 2022-03-17 MED ORDER — SODIUM CHLORIDE 0.9 % IV SOLN: Freq: Once | INTRAVENOUS | Status: AC

## 2022-03-17 NOTE — Patient Instructions (Signed)

## 2022-03-23 ENCOUNTER — Other Ambulatory Visit: Payer: Self-pay

## 2022-03-23 ENCOUNTER — Inpatient Hospital Stay: Payer: 59

## 2022-03-23 VITALS — BP 130/80 | HR 72 | Temp 98.2°F | Resp 18

## 2022-03-23 DIAGNOSIS — D509 Iron deficiency anemia, unspecified: Secondary | ICD-10-CM

## 2022-03-23 DIAGNOSIS — E611 Iron deficiency: Secondary | ICD-10-CM | POA: Diagnosis not present

## 2022-03-23 MED ORDER — SODIUM CHLORIDE 0.9 % IV SOLN: Freq: Once | INTRAVENOUS | Status: AC

## 2022-03-23 MED ORDER — SODIUM CHLORIDE 0.9 % IV SOLN
300.0000 mg | Freq: Once | INTRAVENOUS | Status: AC
  Administered 2022-03-23: 300 mg via INTRAVENOUS
  Filled 2022-03-23: qty 300

## 2022-03-23 NOTE — Patient Instructions (Signed)

## 2022-04-03 ENCOUNTER — Other Ambulatory Visit: Payer: Self-pay

## 2022-05-18 ENCOUNTER — Ambulatory Visit (INDEPENDENT_AMBULATORY_CARE_PROVIDER_SITE_OTHER): Payer: 59 | Admitting: Internal Medicine

## 2022-05-18 ENCOUNTER — Other Ambulatory Visit: Payer: Self-pay

## 2022-05-18 ENCOUNTER — Encounter: Payer: Self-pay | Admitting: Internal Medicine

## 2022-05-18 ENCOUNTER — Other Ambulatory Visit (INDEPENDENT_AMBULATORY_CARE_PROVIDER_SITE_OTHER): Payer: 59

## 2022-05-18 VITALS — BP 128/84 | HR 70 | Ht 69.0 in | Wt 184.0 lb

## 2022-05-18 DIAGNOSIS — E538 Deficiency of other specified B group vitamins: Secondary | ICD-10-CM | POA: Diagnosis not present

## 2022-05-18 DIAGNOSIS — E611 Iron deficiency: Secondary | ICD-10-CM

## 2022-05-18 DIAGNOSIS — K921 Melena: Secondary | ICD-10-CM

## 2022-05-18 LAB — C-REACTIVE PROTEIN: CRP: <1 mg/dL (ref 0.5–20.0)

## 2022-05-18 MED ORDER — NA SULFATE-K SULFATE-MG SULF 17.5-3.13-1.6 GM/177ML PO SOLN
1.0000 | Freq: Once | ORAL | 0 refills | Status: AC
  Filled 2022-05-18: qty 354, 1d supply, fill #0

## 2022-05-18 NOTE — Patient Instructions (Signed)
If you are age 44 or older, your body mass index should be between 23-30. Your Body mass index is 27.17 kg/m. If this is out of the aforementioned range listed, please consider follow up with your Primary Care Provider.  If you are age 40 or younger, your body mass index should be between 19-25. Your Body mass index is 27.17 kg/m. If this is out of the aformentioned range listed, please consider follow up with your Primary Care Provider.   ________________________________________________________  The [facility] providers would like to encourage you to use [facility] to communicate with providers for non-urgent requests or questions.  Due to long hold times on the telephone, sending your provider a message by [facility] may be a faster and more efficient way to get a response.  Please allow 48 business hours for a response.  Please remember that this is for non-urgent requests.  _______________________________________________________   Your provider has requested that you go to the basement level for lab work before leaving today. Press "B" on the elevator. The lab is located at the first door on the left as you exit the elevator.   You have been scheduled for an endoscopy and colonoscopy. Please follow the written instructions given to you at your visit today. Please pick up your prep supplies at the pharmacy within the next 1-3 days. If you use inhalers (even only as needed), please bring them with you on the day of your procedure.   Due to recent changes in healthcare laws, you may see the results of your imaging and laboratory studies on MyChart before your provider has had a chance to review them.  We understand that in some cases there may be results that are confusing or concerning to you. Not all laboratory results come back in the same time frame and the provider may be waiting for multiple results in order to interpret others.  Please give [place] 48 hours in order for your provider to thoroughly  review all the results before contacting the office for clarification of your results.    Thank you for entrusting me with your care and choosing [facility].  Dr.Dorsey

## 2022-05-18 NOTE — Progress Notes (Signed)
Chief Complaint: IDA  HPI: 46 year old female with history of anxiety, depression, IDA presents with IDA   Denies hematochezia. She has always had what she describes as IBS. Her stools are sometimes black. She will alternate between diarrhea and constipation. Endorses some bloating and abdominal cramping that improves after having a BM. Denies use of blood thinners. She will take ibuprofen once month for knee pain. Denies N&V, dysphagia, weight loss, chest burning, or regurgitation. Paternal grandfather had colon cancer. Denies prior EGD or colonoscopy.  Wt Readings from Last 3 Encounters:  05/18/22 184 lb (83.5 kg)  03/17/22 179 lb 1.9 oz (81.2 kg)  03/02/22 181 lb 8 oz (82.3 kg)   Past Medical History:  Diagnosis Date   Allergy    Anxiety    Depression    Precancerous skin lesion    Past Surgical History:  Procedure Laterality Date   KNEE ARTHROSCOPY WITH MEDIAL COLLATERAL LIGAMENT RECONSTRUCTION Right 09/23/2020   Procedure: RIGHT KNEE ARTHROSCOPY DEBRIDMENT  WITH MEDIAL COLLATERAL LIGAMENT RECONSTRUCTION;  Surgeon: Hiram Gash, MD;  Location: [facility];  Service: Orthopedics;  Laterality: Right;   KNEE ARTHROSCOPY WITH MEDIAL PATELLAR FEMORAL LIGAMENT RECONSTRUCTION Right 10/02/2019   Procedure: KNEE ARTHROSCOPY WITH MEDIAL PATELLAR FEMORAL LIGAMENT RECONSTRUCTION WITH ALLOGRAPH;  Surgeon: Hiram Gash, MD;  Location: [place];  Service: Orthopedics;  Laterality: Right;   KNEE SURGERY     x3   Family History  Problem Relation Age of Onset   Healthy Mother    Healthy Father    Colon cancer Maternal Grandfather    Diabetes Maternal Grandfather    Bladder Cancer Paternal Grandmother    Breast cancer Neg Hx    Social History   Tobacco Use   Smoking status: Never   Smokeless tobacco: Never  Vaping Use   Vaping Use: Never used  Substance Use Topics   Alcohol use: Yes   Drug use: Never   Current Outpatient Medications  Medication  Sig Dispense Refill   cholecalciferol (VITAMIN D3) 25 MCG (1000 UNIT) tablet Take 1,000 Units by mouth daily.     cyanocobalamin (,VITAMIN B-12,) 1000 MCG/ML injection Inject 1 mL (1,000 mcg total) into the muscle every 30 (thirty) days. 1 mL 12   fluticasone (FLONASE) 50 MCG/ACT nasal spray Place 2 sprays into both nostrils daily.     loratadine (CLARITIN) 10 MG tablet Take 10 mg by mouth daily as needed for allergies.     No current facility-administered medications for this visit.   Allergies  Allergen Reactions   Codeine Nausea Only   Wound Dressing Adhesive    Benzoin Rash   Iodine Rash   Review of Systems: All systems reviewed and negative except where noted in HPI.   Physical Exam: BP 128/84   Pulse 70   Ht '5\' 9"'$  (1.753 m)   Wt 184 lb (83.5 kg)   BMI 27.17 kg/m  Constitutional: Pleasant,well-developed, female in no acute distress. HEENT: Normocephalic and atraumatic. Conjunctivae are normal. No scleral icterus. Cardiovascular: Normal rate, regular rhythm.  Pulmonary/chest: Effort normal and breath sounds normal. No wheezing, rales or rhonchi. Abdominal: Soft, nondistended, nontender. Bowel sounds active throughout. There are no masses palpable. No hepatomegaly. Extremities: No edema Neurological: Alert and oriented to person place and time. Skin: Skin is warm and dry. No rashes noted. Psychiatric: Normal mood and affect. Behavior is normal.  Labs 02/2022: Positive anti-parietal cell antibody. IF antibody negative. Ferritin low at 5.8. Iron sat low at  15.4%. CBC with Hb of 12.8. Vitamin B12 222. CMP nml.   ASSESSMENT AND PLAN: Iron deficiency Vitamin B12 deficiency Occasional melena Positive anti-parietal cell antibody Alternating constipation and diarrhea Patient presents with iron deficiency, vitamin B12 deficiency, and melena. She has required intermittent IV iron infusions and regular B12 injections in order to get keep her levels up. She was found to have a  positive anti-parietal cell antibody and thus would be at risk for having autoimmune metaplastic atrophic gastritis. At this time I do think it would be reasonable to proceed with EGD and colonoscopy for further evaluation to look for a GI source malabsorption. Will also perform labs to check her inflammatory marker and celiac antibodies. - Check CRP, TTG IgA, IgA - EGD/colonoscopy [facility]  [name], MD

## 2022-05-19 ENCOUNTER — Other Ambulatory Visit: Payer: Self-pay

## 2022-05-19 LAB — TISSUE TRANSGLUTAMINASE, IGA: (tTG) Ab, IgA: <1 U/mL

## 2022-05-19 LAB — IGA: Immunoglobulin A: 123 mg/dL (ref 47–310)

## 2022-05-24 ENCOUNTER — Telehealth: Payer: 59 | Admitting: Physician Assistant

## 2022-05-24 DIAGNOSIS — M545 Low back pain, unspecified: Secondary | ICD-10-CM

## 2022-05-25 ENCOUNTER — Other Ambulatory Visit: Payer: Self-pay

## 2022-05-25 MED ORDER — BACLOFEN 10 MG PO TABS
10.0000 mg | ORAL_TABLET | Freq: Three times a day (TID) | ORAL | 0 refills | Status: DC
  Filled 2022-05-25: qty 30, 10d supply, fill #0

## 2022-05-25 MED ORDER — METHYLPREDNISOLONE 4 MG PO TBPK
ORAL_TABLET | ORAL | 0 refills | Status: DC
  Filled 2022-05-25: qty 21, 6d supply, fill #0

## 2022-05-25 NOTE — Progress Notes (Signed)
We are sorry that you are not feeling well.  Here is how we plan to help!  Based on what you have shared with me it looks like you mostly have acute back pain.  Acute back pain is defined as musculoskeletal pain that can resolve in 1-3 weeks with conservative treatment.  I have prescribed Medrol dose pak a steroidal anti-inflammatory as well as Baclofen 10 mg every eight hours as needed which is a muscle relaxer  Some patients experience stomach irritation or in increased heartburn with anti-inflammatory drugs.  Please keep in mind that muscle relaxer's can cause fatigue and should not be taken while at work or driving.  Back pain is very common.  The pain often gets better over time.  The cause of back pain is usually not dangerous.  Most people can learn to manage their back pain on their own.  Home Care Stay active.  Start with short walks on flat ground if you can.  Try to walk farther each day. Do not sit, drive or stand in one place for more than 30 minutes.  Do not stay in bed. Do not avoid exercise or work.  Activity can help your back heal faster. Be careful when you bend or lift an object.  Bend at your knees, keep the object close to you, and do not twist. Sleep on a firm mattress.  Lie on your side, and bend your knees.  If you lie on your back, put a pillow under your knees. Only take medicines as told by your doctor. Put ice on the injured area. Put ice in a plastic bag Place a towel between your skin and the bag Leave the ice on for 15-20 minutes, 3-4 times a day for the first 2-3 days. 210 After that, you can switch between ice and heat packs. Ask your doctor about back exercises or massage. Avoid feeling anxious or stressed.  Find good ways to deal with stress, such as exercise.  Get Help Right Way If: Your pain does not go away with rest or medicine. Your pain does not go away in 1 week. You have new problems. You do not feel well. The pain spreads into your legs. You  cannot control when you poop (bowel movement) or pee (urinate) You feel sick to your stomach (nauseous) or throw up (vomit) You have belly (abdominal) pain. You feel like you may pass out (faint). If you develop a fever.  Make Sure you: Understand these instructions. Will watch your condition Will get help right away if you are not doing well or get worse.  Your e-visit answers were reviewed by a board certified advanced clinical practitioner to complete your personal care plan.  Depending on the condition, your plan could have included both over the counter or prescription medications.  If there is a problem please reply  once you have received a response from your provider.  Your safety is important to [place].  If you have drug allergies check your prescription carefully.    You can use MyChart to ask questions about today's visit, request a non-urgent call back, or ask for a work or school excuse for 24 hours related to this e-Visit. If it has been greater than 24 hours you will need to follow up with your provider, or enter a new e-Visit to address those concerns.  You will get an e-mail in the next two days asking about your experience.  I hope that your e-visit has been valuable and will  speed your recovery. Thank you for using e-visits.  I provided 5 minutes of non face-to-face time during this encounter for chart review and documentation.

## 2022-05-26 NOTE — Telephone Encounter (Signed)
error 

## 2022-06-02 ENCOUNTER — Other Ambulatory Visit: Payer: Self-pay

## 2022-06-05 ENCOUNTER — Other Ambulatory Visit (INDEPENDENT_AMBULATORY_CARE_PROVIDER_SITE_OTHER): Payer: 59

## 2022-06-05 DIAGNOSIS — D509 Iron deficiency anemia, unspecified: Secondary | ICD-10-CM

## 2022-06-05 LAB — IBC + FERRITIN
Ferritin: 65.6 ng/mL (ref 10.0–291.0)
Iron: 45 ug/dL (ref 42–145)
Saturation Ratios: 13.9 % — ABNORMAL LOW (ref 20.0–50.0)
TIBC: 324.8 ug/dL (ref 250.0–450.0)
Transferrin: 232 mg/dL (ref 212.0–360.0)

## 2022-06-05 LAB — VITAMIN B12: Vitamin B-12: 436 pg/mL (ref 211–911)

## 2022-06-20 ENCOUNTER — Other Ambulatory Visit: Payer: Self-pay

## 2022-06-22 ENCOUNTER — Other Ambulatory Visit: Payer: Self-pay | Admitting: Primary Care

## 2022-06-22 DIAGNOSIS — Z1231 Encounter for screening mammogram for malignant neoplasm of breast: Secondary | ICD-10-CM

## 2022-07-07 ENCOUNTER — Other Ambulatory Visit (HOSPITAL_COMMUNITY): Payer: Self-pay

## 2022-07-07 ENCOUNTER — Encounter: Payer: Self-pay | Admitting: Internal Medicine

## 2022-07-07 ENCOUNTER — Ambulatory Visit (AMBULATORY_SURGERY_CENTER): Payer: 59 | Admitting: Internal Medicine

## 2022-07-07 VITALS — BP 113/66 | HR 69 | Temp 97.8°F | Resp 11 | Ht 69.0 in | Wt 184.0 lb

## 2022-07-07 DIAGNOSIS — K635 Polyp of colon: Secondary | ICD-10-CM

## 2022-07-07 DIAGNOSIS — K209 Esophagitis, unspecified without bleeding: Secondary | ICD-10-CM | POA: Diagnosis not present

## 2022-07-07 DIAGNOSIS — D509 Iron deficiency anemia, unspecified: Secondary | ICD-10-CM | POA: Diagnosis not present

## 2022-07-07 DIAGNOSIS — F32A Depression, unspecified: Secondary | ICD-10-CM | POA: Diagnosis not present

## 2022-07-07 DIAGNOSIS — K2970 Gastritis, unspecified, without bleeding: Secondary | ICD-10-CM | POA: Diagnosis not present

## 2022-07-07 DIAGNOSIS — K921 Melena: Secondary | ICD-10-CM

## 2022-07-07 DIAGNOSIS — K2100 Gastro-esophageal reflux disease with esophagitis, without bleeding: Secondary | ICD-10-CM

## 2022-07-07 DIAGNOSIS — K319 Disease of stomach and duodenum, unspecified: Secondary | ICD-10-CM | POA: Diagnosis not present

## 2022-07-07 DIAGNOSIS — K649 Unspecified hemorrhoids: Secondary | ICD-10-CM

## 2022-07-07 DIAGNOSIS — F419 Anxiety disorder, unspecified: Secondary | ICD-10-CM | POA: Diagnosis not present

## 2022-07-07 MED ORDER — SODIUM CHLORIDE 0.9 % IV SOLN: 500.0000 mL | Freq: Once | INTRAVENOUS | Status: DC

## 2022-07-07 MED ORDER — OMEPRAZOLE 40 MG PO CPDR
40.0000 mg | DELAYED_RELEASE_CAPSULE | Freq: Two times a day (BID) | ORAL | 1 refills | Status: DC
  Filled 2022-07-07: qty 60, 30d supply, fill #0

## 2022-07-07 MED ORDER — OMEPRAZOLE 40 MG PO CPDR: 40.0000 mg | DELAYED_RELEASE_CAPSULE | Freq: Two times a day (BID) | ORAL | 1 refills | Status: DC

## 2022-07-07 NOTE — Progress Notes (Signed)
GASTROENTEROLOGY PROCEDURE H&P NOTE   Primary Care Physician: [name], NP    Reason for Procedure:   Iron deficiency, vitamin B12 deficiency, occasional melena, positive anti-parietal cell antibody  Plan:    EGD/colonoscopy  Patient is appropriate for endoscopic procedure(s) in the ambulatory ([place]) setting.  The nature of the procedure, as well as the risks, benefits, and alternatives were carefully and thoroughly reviewed with the patient. Ample time for discussion and questions allowed. The patient understood, was satisfied, and agreed to proceed.     HPI: Dawn Hamilton is a 45 y.o. female who presents for EGD/colonoscopy for evaluation of iron deficiency, vitamin B12 deficiency, occasional melena, positive anti-parietal cell antibody. .  Patient was most recently seen in the Gastroenterology Clinic on 05/18/22.  No interval change in medical history since that appointment. Please refer to that note for full details regarding GI history and clinical presentation.   Past Medical History:  Diagnosis Date   Allergy    Anxiety    Depression    Precancerous skin lesion     Past Surgical History:  Procedure Laterality Date   KNEE ARTHROSCOPY WITH MEDIAL COLLATERAL LIGAMENT RECONSTRUCTION Right 09/23/2020   Procedure: RIGHT KNEE ARTHROSCOPY DEBRIDMENT  WITH MEDIAL COLLATERAL LIGAMENT RECONSTRUCTION;  Surgeon: Hiram Gash, MD;  Location: [place];  Service: Orthopedics;  Laterality: Right;   KNEE ARTHROSCOPY WITH MEDIAL PATELLAR FEMORAL LIGAMENT RECONSTRUCTION Right 10/02/2019   Procedure: KNEE ARTHROSCOPY WITH MEDIAL PATELLAR FEMORAL LIGAMENT RECONSTRUCTION WITH ALLOGRAPH;  Surgeon: Hiram Gash, MD;  Location: [place];  Service: Orthopedics;  Laterality: Right;   KNEE SURGERY     x3    Prior to Admission medications   Medication Sig Start Date End Date Taking? Authorizing Provider  cholecalciferol (VITAMIN D3) 25 MCG (1000  UNIT) tablet Take 1,000 Units by mouth daily.   Yes [provider]  baclofen (LIORESAL) 10 MG tablet Take 1 tablet (10 mg total) by mouth 3 (three) times daily. 05/25/22   Mar Daring, PA-C  cyanocobalamin (VITAMIN B12) 1000 MCG/ML injection Inject 1 mL (1,000 mcg total) into the muscle every 30 (thirty) days. 03/05/22   Gardenia Phlegm, NP  fluticasone (FLONASE) 50 MCG/ACT nasal spray Place 2 sprays into both nostrils daily.    [provider]  loratadine (CLARITIN) 10 MG tablet Take 10 mg by mouth daily as needed for allergies.    [provider]  methylPREDNISolone (MEDROL DOSEPAK) 4 MG TBPK tablet 6 day taper; take as directed on package instructions 05/25/22   Mar Daring, PA-C    Current Outpatient Medications  Medication Sig Dispense Refill   cholecalciferol (VITAMIN D3) 25 MCG (1000 UNIT) tablet Take 1,000 Units by mouth daily.     baclofen (LIORESAL) 10 MG tablet Take 1 tablet (10 mg total) by mouth 3 (three) times daily. 30 each 0   cyanocobalamin (VITAMIN B12) 1000 MCG/ML injection Inject 1 mL (1,000 mcg total) into the muscle every 30 (thirty) days. 1 mL 12   fluticasone (FLONASE) 50 MCG/ACT nasal spray Place 2 sprays into both nostrils daily.     loratadine (CLARITIN) 10 MG tablet Take 10 mg by mouth daily as needed for allergies.     methylPREDNISolone (MEDROL DOSEPAK) 4 MG TBPK tablet 6 day taper; take as directed on package instructions 21 tablet 0   Current Facility-Administered Medications  Medication Dose Route Frequency Provider Last Rate Last Admin   0.9 %  sodium chloride infusion  500 mL Intravenous Once Sharyn Creamer, MD        Allergies as of 07/07/2022 - Review Complete 07/07/2022  Allergen Reaction Noted   Codeine Nausea Only 01/20/2014   Wound dressing adhesive     Benzoin Rash 01/20/2014   Iodine Rash 01/20/2014    Family History  Problem Relation Age of Onset   Healthy Mother    Healthy Father     Colon cancer Maternal Grandfather    Diabetes Maternal Grandfather    Bladder Cancer Paternal Grandmother    Breast cancer Neg Hx     Social History   Socioeconomic History   Marital status: Married    Spouse name: Not on file   Number of children: Not on file   Years of education: Not on file   Highest education level: Not on file  Occupational History   Not on file  Tobacco Use   Smoking status: Never   Smokeless tobacco: Never  Vaping Use   Vaping Use: Never used  Substance and Sexual Activity   Alcohol use: Yes   Drug use: Never   Sexual activity: Yes  Other Topics Concern   Not on file  Social History Narrative   Married.   2 children.   Works as a Psychologist, forensic.   Enjoys spending time outdoors, going to the lake.   Social Determinants of Health   Financial Resource Strain: Low Risk  (03/02/2022)   Overall Financial Resource Strain (CARDIA)    Difficulty of Paying Living Expenses: Not hard at all  Food Insecurity: No Food Insecurity (03/02/2022)   Hunger Vital Sign    Worried About Running Out of Food in the Last Year: Never true    Ran Out of Food in the Last Year: Never true  Transportation Needs: No Transportation Needs (03/02/2022)   PRAPARE - Hydrologist (Medical): No    Lack of Transportation (Non-Medical): No  Physical Activity: Sufficiently Active (03/02/2022)   Exercise Vital Sign    Days of Exercise per Week: 6 days    Minutes of Exercise per Session: 30 min  Stress: No Stress Concern Present (03/02/2022)   [place]    Feeling of Stress : Not at all  Social Connections: [place] (03/02/2022)   Social Connection and Isolation Panel [NHANES]    Frequency of Communication with Friends and Family: More than three times a week    Frequency of Social Gatherings with Friends and Family: More than three times a week    Attends Religious Services: More than  4 times per year    Active Member of [employer] or Organizations: Yes    Attends Music therapist: More than 4 times per year    Marital Status: Married  Human resources officer Violence: Not At Risk (03/02/2022)   Humiliation, Afraid, Rape, and Kick questionnaire    Fear of Current or Ex-Partner: No    Emotionally Abused: No    Physically Abused: No    Sexually Abused: No    Physical Exam: Vital signs in last 24 hours: BP 116/88   Pulse 86   Temp 97.8 F (36.6 C) (Temporal)   Ht '5\' 9"'$  (1.753 m)   Wt 184 lb (83.5 kg)   LMP 06/26/2022   SpO2 99%   BMI 27.17 kg/m  GEN: NAD EYE: Sclerae anicteric ENT: MMM CV: Non-tachycardic Pulm: No increased WOB GI: Soft NEURO:  Alert & Oriented  [name], MD [facility]   07/07/2022 1:17 PM

## 2022-07-07 NOTE — Progress Notes (Signed)
Pt's states no medical or surgical changes since previsit or office visit. 

## 2022-07-07 NOTE — Progress Notes (Signed)
Pt in recovery with monitors in place, VSS. Report given to receiving RN. Bite guard was placed with pt awake to ensure comfort. No dental or soft tissue damage noted. 

## 2022-07-07 NOTE — Op Note (Signed)
[facility] Patient Name: Dawn Hamilton Procedure Date: 07/07/2022 1:23 PM MRN: [id number] Endoscopist: Sonny Masters "[name] ,  Age: 46 Referring MD:  Date of Birth: 1976/09/19 Gender: Female Account #: [id number] Procedure:                Colonoscopy Indications:              Melena, vitamin B12 deficiency, iron deficiency Medicines:                Monitored Anesthesia Care Procedure:                Pre-Anesthesia Assessment:                           - Prior to the procedure, a History and Physical                            was performed, and patient medications and                            allergies were reviewed. The patient's tolerance of                            previous anesthesia was also reviewed. The risks                            and benefits of the procedure and the sedation                            options and risks were discussed with the patient.                            All questions were answered, and informed consent                            was obtained. Prior Anticoagulants: The patient has                            taken no previous anticoagulant or antiplatelet                            agents. ASA Grade Assessment: II - A patient with                            mild systemic disease. After reviewing the risks                            and benefits, the patient was deemed in                            satisfactory condition to undergo the procedure.                           After obtaining informed consent, the colonoscope  was passed under direct vision. Throughout the                            procedure, the patient's blood pressure, pulse, and                            oxygen saturations were monitored continuously. The                            Olympus CF-HQ190L [id number]) Colonoscope was                            introduced through the anus and advanced to the the                            terminal  ileum. The colonoscopy was performed                            without difficulty. The patient tolerated the                            procedure well. The quality of the bowel                            preparation was excellent. Scope In: 1:42:48 PM Scope Out: 2:03:53 PM Scope Withdrawal Time: 0 hours 16 minutes 55 seconds  Total Procedure Duration: 0 hours 21 minutes 5 seconds  Findings:                 The terminal ileum appeared normal.                           A 4 mm polyp was found in the ascending colon. The                            polyp was sessile. The polyp was removed with a                            cold snare. Resection and retrieval were complete.                           Non-bleeding internal hemorrhoids were found during                            retroflexion.                           Biopsies were taken with a cold forceps in the                            entire colon for histology. Complications:            No immediate complications. Estimated Blood Loss:     Estimated blood loss was minimal. Impression:               - The  examined portion of the ileum was normal.                           - One 4 mm polyp in the ascending colon, removed                            with a cold snare. Resected and retrieved.                           - Non-bleeding internal hemorrhoids.                           - Biopsies were taken with a cold forceps for                            histology in the entire colon. Recommendation:           - Discharge patient to home (with escort).                           - Await pathology results.                           - Return to GI clinic in 8 weeks.                           - The findings and recommendations were discussed                            with the patient. Dr Georgian Co "Lyndee Leo" [place],  07/07/2022 2:12:47 PM

## 2022-07-07 NOTE — Op Note (Signed)
[facility] Patient Name: Dawn Hamilton Procedure Date: 07/07/2022 1:24 PM MRN: [id number] Endoscopist: Sonny Masters "Dawn Hamilton ,  Age: 46 Referring MD:  Date of Birth: 1976/02/11 Gender: Female Account #: [id number] Procedure:                Upper GI endoscopy Indications:              Melena, Unexplained iron deficiency anemia, Vitamin                            B12 deficiency, positive anti-parietal cell antibody Medicines:                Monitored Anesthesia Care Procedure:                Pre-Anesthesia Assessment:                           - Prior to the procedure, a History and Physical                            was performed, and patient medications and                            allergies were reviewed. The patient's tolerance of                            previous anesthesia was also reviewed. The risks                            and benefits of the procedure and the sedation                            options and risks were discussed with the patient.                            All questions were answered, and informed consent                            was obtained. Prior Anticoagulants: The patient has                            taken no previous anticoagulant or antiplatelet                            agents. ASA Grade Assessment: II - A patient with                            mild systemic disease. After reviewing the risks                            and benefits, the patient was deemed in                            satisfactory condition to undergo the procedure.  After obtaining informed consent, the endoscope was                            passed under direct vision. Throughout the                            procedure, the patient's blood pressure, pulse, and                            oxygen saturations were monitored continuously. The                            Endoscope was introduced through the mouth, and                             advanced to the second part of duodenum. The upper                            GI endoscopy was accomplished without difficulty.                            The patient tolerated the procedure well. Scope In: Scope Out: Findings:                 LA Grade A (one or more mucosal breaks less than 5                            mm, not extending between tops of 2 mucosal folds)                            esophagitis with no bleeding was found in the                            distal esophagus.                           Localized erythematous mucosa without bleeding was                            found in the gastric antrum. This was biopsied with                            a cold forceps for histology.                           The examined duodenum was normal. Biopsies were                            taken with a cold forceps for histology. Complications:            No immediate complications. Estimated Blood Loss:     Estimated blood loss was minimal. Impression:               - LA Grade A reflux esophagitis with no bleeding.                           -  Erythematous mucosa in the antrum. Biopsied.                           - Normal examined duodenum. Biopsied. Recommendation:           - Await pathology results.                           - Use Prilosec (omeprazole) 40 mg PO BID for 8                            weeks.                           - Perform a colonoscopy today. Dr Georgian Co "Counce" [place],  07/07/2022 2:10:21 PM

## 2022-07-07 NOTE — Progress Notes (Signed)
Called to room to assist during endoscopic procedure.  Patient ID and intended procedure confirmed with present staff. Received instructions for my participation in the procedure from the performing physician.  

## 2022-07-07 NOTE — Patient Instructions (Signed)
Handouts on colon polyps & hemorrhoids given to you today  Make an appointment to return to Dr Lorenso Courier in office in 8 weeks   Await pathology results  Upper Endoscopy:  Await pathology results   Handout on Esophagitis & gastritis given to you   Take Omeprazole 40 mg twice a day for 8 weeks - 30 min before 1st drink or meal of the sdy & in the evening- sent order to [facility]:   Refer to the procedure report that was given to you for any specific questions about what was found during the examination.  If the procedure report does not answer your questions, please call your gastroenterologist to clarify.  If you requested that your care partner not be given the details of your procedure findings, then the procedure report has been included in a sealed envelope for you to review at your convenience later.  YOU SHOULD EXPECT: Some feelings of bloating in the abdomen. Passage of more gas than usual.  Walking can help get rid of the air that was put into your GI tract during the procedure and reduce the bloating. If you had a lower endoscopy (such as a colonoscopy or flexible sigmoidoscopy) you may notice spotting of blood in your stool or on the toilet paper. If you underwent a bowel prep for your procedure, you may not have a normal bowel movement for a few days.  Please Note:  You might notice some irritation and congestion in your nose or some drainage.  This is from the oxygen used during your procedure.  There is no need for concern and it should clear up in a day or so.  SYMPTOMS TO REPORT IMMEDIATELY:  Following lower endoscopy (colonoscopy or flexible sigmoidoscopy):  Excessive amounts of blood in the stool  Significant tenderness or worsening of abdominal pains  Swelling of the abdomen that is new, acute  Fever of 100F or higher  Following upper endoscopy (EGD)  Vomiting of blood or coffee ground  material  New chest pain or pain under the shoulder blades  Painful or persistently difficult swallowing  New shortness of breath  Fever of 100F or higher  Black, tarry-looking stools  For urgent or emergent issues, a gastroenterologist can be reached at any hour by calling [phone]. Do not use MyChart messaging for urgent concerns.    DIET:  We do recommend a small meal at first, but then you may proceed to your regular diet.  Drink plenty of fluids but you should avoid alcoholic beverages for 24 hours.  ACTIVITY:  You should plan to take it easy for the rest of today and you should NOT DRIVE or use heavy machinery until tomorrow (because of the sedation medicines used during the test).    FOLLOW UP: Our staff will call the number listed on your records the next business day following your procedure.  We will call around 7:15- 8:00 am to check on you and address any questions or concerns that you may have regarding the information given to you following your procedure. If we do not reach you, we will leave a message.  If you develop any symptoms (ie: fever, flu-like symptoms, shortness of breath, cough etc.) before then, please call [phone].  If you test positive for Covid 19 in the 2 weeks post procedure, please call and report this information to [place].    If any biopsies were  taken you will be contacted by phone or by letter within the next 1-3 weeks.  Please call us at [phone] if you have not heard about the biopsies in 3 weeks.    SIGNATURES/CONFIDENTIALITY: You and/or your care partner have signed paperwork which will be entered into your electronic medical record.  These signatures attest to the fact that that the information above on your After Visit Summary has been reviewed and is understood.  Full responsibility of the confidentiality of this discharge information lies with you and/or your care-partner.

## 2022-07-10 ENCOUNTER — Telehealth: Payer: Self-pay

## 2022-07-10 NOTE — Telephone Encounter (Signed)
  Follow up Call-     07/07/2022   12:40 PM  Call back number  Post procedure Call Back phone  # [phone]  Permission to leave phone message Yes     Patient questions:  Do you have a fever, pain , or abdominal swelling? No. Pain Score  0 *  Have you tolerated food without any problems? Yes.    Have you been able to return to your normal activities? Yes.    Do you have any questions about your discharge instructions: Diet   No. Medications  No. Follow up visit  No.  Do you have questions or concerns about your Care? No.  Actions: * If pain score is 4 or above: No action needed, pain <4.

## 2022-07-12 ENCOUNTER — Encounter: Payer: Self-pay | Admitting: Internal Medicine

## 2022-07-24 ENCOUNTER — Telehealth: Payer: Self-pay | Admitting: Adult Health

## 2022-07-24 NOTE — Telephone Encounter (Signed)
Rescheduled appointment per provider PAL. Patient is aware of the changes made to her upcoming appointment. 

## 2022-08-04 ENCOUNTER — Ambulatory Visit
Admission: RE | Admit: 2022-08-04 | Discharge: 2022-08-04 | Disposition: A | Discharge status: Home / Self Care | Payer: 59 | Source: Ambulatory Visit | Attending: Primary Care | Admitting: Primary Care

## 2022-08-04 DIAGNOSIS — Z1231 Encounter for screening mammogram for malignant neoplasm of breast: Secondary | ICD-10-CM | POA: Insufficient documentation

## 2022-08-22 ENCOUNTER — Ambulatory Visit: Payer: 59 | Admitting: Internal Medicine

## 2022-08-31 ENCOUNTER — Ambulatory Visit: Payer: 59 | Admitting: Adult Health

## 2022-09-01 DIAGNOSIS — D2261 Melanocytic nevi of right upper limb, including shoulder: Secondary | ICD-10-CM | POA: Diagnosis not present

## 2022-09-01 DIAGNOSIS — D2262 Melanocytic nevi of left upper limb, including shoulder: Secondary | ICD-10-CM | POA: Diagnosis not present

## 2022-09-01 DIAGNOSIS — Z872 Personal history of diseases of the skin and subcutaneous tissue: Secondary | ICD-10-CM | POA: Diagnosis not present

## 2022-09-01 DIAGNOSIS — H524 Presbyopia: Secondary | ICD-10-CM | POA: Diagnosis not present

## 2022-09-01 DIAGNOSIS — D225 Melanocytic nevi of trunk: Secondary | ICD-10-CM | POA: Diagnosis not present

## 2022-09-01 DIAGNOSIS — L814 Other melanin hyperpigmentation: Secondary | ICD-10-CM | POA: Diagnosis not present

## 2022-09-01 DIAGNOSIS — R238 Other skin changes: Secondary | ICD-10-CM | POA: Diagnosis not present

## 2022-09-01 DIAGNOSIS — D2272 Melanocytic nevi of left lower limb, including hip: Secondary | ICD-10-CM | POA: Diagnosis not present

## 2022-09-01 DIAGNOSIS — D2271 Melanocytic nevi of right lower limb, including hip: Secondary | ICD-10-CM | POA: Diagnosis not present

## 2022-09-01 DIAGNOSIS — B078 Other viral warts: Secondary | ICD-10-CM | POA: Diagnosis not present

## 2022-09-14 ENCOUNTER — Telehealth: Payer: Self-pay | Admitting: Adult Health

## 2022-09-14 NOTE — Telephone Encounter (Signed)
Called patient to reschedule her appointment per provider PAL. During the call, the patient decided that she wanted to cancel her appointment at this time and will call back to reschedule at a later date.

## 2022-09-15 ENCOUNTER — Other Ambulatory Visit: Payer: Self-pay

## 2022-09-15 ENCOUNTER — Encounter: Payer: Self-pay | Admitting: Internal Medicine

## 2022-09-15 ENCOUNTER — Ambulatory Visit: Payer: 59 | Admitting: Adult Health

## 2022-09-15 ENCOUNTER — Ambulatory Visit (INDEPENDENT_AMBULATORY_CARE_PROVIDER_SITE_OTHER): Payer: 59 | Admitting: Internal Medicine

## 2022-09-15 VITALS — BP 128/74 | HR 81 | Ht 69.0 in | Wt 181.2 lb

## 2022-09-15 DIAGNOSIS — K219 Gastro-esophageal reflux disease without esophagitis: Secondary | ICD-10-CM

## 2022-09-15 DIAGNOSIS — E611 Iron deficiency: Secondary | ICD-10-CM

## 2022-09-15 MED ORDER — PANTOPRAZOLE SODIUM 40 MG PO TBEC
40.0000 mg | DELAYED_RELEASE_TABLET | Freq: Every day | ORAL | 11 refills | Status: DC
  Filled 2022-09-15: qty 90, 90d supply, fill #0
  Filled 2022-12-11: qty 90, 90d supply, fill #1

## 2022-09-15 NOTE — Progress Notes (Signed)
Chief Complaint: GERD  HPI: 46 year old female with history of anxiety, depression, IDA presents for follow up of GERD  Interval History: She took the omeprazole for 3 weeks but then it made her feel bloated and gassy so she then stopped the medication. She is still having some uncontrolled reflux. Bowel habits are still somewhat irregular. She was taken off of oral iron supplements. She is scheduled for another hematology appt in the future for consideration of IV iron infusion. Black stool has gone away. She is still taking vitamin B12 injections  Wt Readings from Last 3 Encounters:  09/15/22 181 lb 4 oz (82.2 kg)  07/07/22 184 lb (83.5 kg)  05/18/22 184 lb (83.5 kg)   Current Outpatient Medications  Medication Sig Dispense Refill   cyanocobalamin (VITAMIN B12) 1000 MCG/ML injection Inject 1 mL (1,000 mcg total) into the muscle every 30 (thirty) days. 1 mL 12   loratadine (CLARITIN) 10 MG tablet Take 10 mg by mouth daily as needed for allergies.     baclofen (LIORESAL) 10 MG tablet Take 1 tablet (10 mg total) by mouth 3 (three) times daily. 30 each 0   cholecalciferol (VITAMIN D3) 25 MCG (1000 UNIT) tablet Take 1,000 Units by mouth daily.     fluticasone (FLONASE) 50 MCG/ACT nasal spray Place 2 sprays into both nostrils daily. (Patient not taking: Reported on 09/15/2022)     methylPREDNISolone (MEDROL DOSEPAK) 4 MG TBPK tablet 6 day taper; take as directed on package instructions 21 tablet 0   omeprazole (PRILOSEC) 40 MG capsule Take 1 capsule (40 mg total) by mouth 2 (two) times daily. 60 capsule 1   omeprazole (PRILOSEC) 40 MG capsule Take 1 capsule (40 mg total) by mouth 2 (two) times daily. 60 capsule 1   No current facility-administered medications for this visit.   Physical Exam: BP 128/74   Pulse 81   Ht '5\' 9"'$  (1.753 m)   Wt 181 lb 4 oz (82.2 kg)   BMI 26.77 kg/m  Constitutional: Pleasant,well-developed, female in no acute distress. HEENT: Normocephalic and atraumatic.  Conjunctivae are normal. No scleral icterus. Cardiovascular: Normal rate, regular rhythm.  Pulmonary/chest: Effort normal and breath sounds normal. No wheezing, rales or rhonchi. Abdominal: Soft, nondistended, nontender. Bowel sounds active throughout. There are no masses palpable. No hepatomegaly. Extremities: No edema Neurological: Alert and oriented to person place and time. Skin: Skin is warm and dry. No rashes noted. Psychiatric: Normal mood and affect. Behavior is normal.  Labs 02/2022: Positive anti-parietal cell antibody. IF antibody negative. Ferritin low at 5.8. Iron sat low at 15.4%. CBC with Hb of 12.8. Vitamin B12 222. CMP nml.   Labs 05/2022: CRP nml, TTG IgA negative, IgA nml.   EGD 07/07/22: - LA Grade A reflux esophagitis with no bleeding. - Erythematous mucosa in the antrum. Biopsied. - Normal examined duodenum. Biopsied. Path: 1. Surgical [P], duodenal biopsy - DUODENAL MUCOSA WITH NORMAL VILLOUS ARCHITECTURE. - NO VILLOUS ATROPHY OR INCREASED INTRAEPITHELIAL LYMPHOCYTES. 2. Surgical [P], gastric antrum and gastric body - ANTRAL AND OXYNTIC MUCOSA WITH NO SIGNIFICANT PATHOLOGIC CHANGES. - NO HELICOBACTER PYLORI IDENTIFIED.  Colonoscopy 07/07/22: - The examined portion of the ileum was normal. - One 4 mm polyp in the ascending colon, removed with a cold snare. Resected and retrieved. - Non-bleeding internal hemorrhoids. - Biopsies were taken with a cold forceps for histology in the entire colon. Path: 3. Surgical [P], colon, ascending, polyp (1) - SESSILE SERRATED POLYP WITHOUT CYTOLOGIC DYSPLASIA. 4. Surgical [P], random  colon biopsy - COLONIC MUCOSA WITH NO SIGNIFICANT PATHOLOGIC CHANGES. - NO MICROSCOPIC COLITIS, ACTIVE INFLAMMATION OR GRANULOMAS.  ASSESSMENT AND PLAN: Iron deficiency Vitamin B12 deficiency Positive anti-parietal cell antibody Alternating constipation and diarrhea History of colon polyp Patient was not able to tolerate the omeprazole so  will switch her to an alternative PPI to see if this will help with GERD control. If she is not able to tolerate pantoprazole, then will plan to switch her to an H2 blocker. She was not found on her recent EGD and colonoscopy to have any obvious malabsorption disorder based upon stomach, small bowel, and colon biopsies.  - Start daily pantoprazole 40 mg QD  - Next colonoscopy due in 06/2027 - RTC PRN  [name], MD  I spent 34 minutes of time, including in depth chart review, independent review of results as outlined above, communicating results with the patient directly, face-to-face time with the patient, coordinating care, and ordering studies and medications as appropriate, and documentation.

## 2022-09-15 NOTE — Patient Instructions (Signed)
_______________________________________________________  If you are age 44 or older, your body mass index should be between 23-30. Your Body mass index is 26.77 kg/m. If this is out of the aforementioned range listed, please consider follow up with your Primary Care Provider.  If you are age 87 or younger, your body mass index should be between 19-25. Your Body mass index is 26.77 kg/m. If this is out of the aformentioned range listed, please consider follow up with your Primary Care Provider.   ________________________________________________________  The [facility] providers would like to encourage you to use [facility] to communicate with providers for non-urgent requests or questions.  Due to long hold times on the telephone, sending your provider a message by [facility] may be a faster and more efficient way to get a response.  Please allow 48 business hours for a response.  Please remember that this is for non-urgent requests.  _______________________________________________________   We have sent the following medications to your pharmacy for you to pick up at your convenience:   Start pantoprazole 40 mg 1 tablet 30-60 minutes prior to breakfast meal each day. Stop taking Prilosec  You will follow up with [place] on a as needed basis Thank you for entrusting me with your care and choosing [facility].  Dr Lorenso Courier

## 2022-09-27 ENCOUNTER — Other Ambulatory Visit: Payer: Self-pay

## 2022-09-27 ENCOUNTER — Telehealth: Payer: 59 | Admitting: Physician Assistant

## 2022-09-27 DIAGNOSIS — B9689 Other specified bacterial agents as the cause of diseases classified elsewhere: Secondary | ICD-10-CM | POA: Diagnosis not present

## 2022-09-27 DIAGNOSIS — J019 Acute sinusitis, unspecified: Secondary | ICD-10-CM | POA: Diagnosis not present

## 2022-09-27 MED ORDER — AMOXICILLIN-POT CLAVULANATE 875-125 MG PO TABS
1.0000 | ORAL_TABLET | Freq: Two times a day (BID) | ORAL | 0 refills | Status: DC
  Filled 2022-09-27: qty 14, 7d supply, fill #0

## 2022-09-27 NOTE — Progress Notes (Signed)

## 2022-09-27 NOTE — Progress Notes (Signed)
I have spent 5 minutes in review of e-visit questionnaire, review and updating patient chart, medical decision making and response to patient.   Oseas Detty Cody Kanyah Matsushima, PA-C    

## 2022-09-27 NOTE — Addendum Note (Signed)
Addended by: Mar Daring on: 09/27/2022 03:45 PM   Modules accepted: Orders

## 2022-09-29 ENCOUNTER — Encounter: Payer: Self-pay | Admitting: Adult Health

## 2022-09-29 ENCOUNTER — Inpatient Hospital Stay: Payer: 59

## 2022-09-29 ENCOUNTER — Inpatient Hospital Stay: Payer: 59 | Attending: Adult Health | Admitting: Adult Health

## 2022-09-29 VITALS — BP 134/98 | HR 72 | Temp 97.3°F | Resp 16 | Wt 182.7 lb

## 2022-09-29 DIAGNOSIS — D500 Iron deficiency anemia secondary to blood loss (chronic): Secondary | ICD-10-CM

## 2022-09-29 DIAGNOSIS — Z800 Family history of malignant neoplasm of digestive organs: Secondary | ICD-10-CM | POA: Insufficient documentation

## 2022-09-29 DIAGNOSIS — N920 Excessive and frequent menstruation with regular cycle: Secondary | ICD-10-CM | POA: Insufficient documentation

## 2022-09-29 DIAGNOSIS — D509 Iron deficiency anemia, unspecified: Secondary | ICD-10-CM | POA: Insufficient documentation

## 2022-09-29 DIAGNOSIS — Z8052 Family history of malignant neoplasm of bladder: Secondary | ICD-10-CM | POA: Insufficient documentation

## 2022-09-29 DIAGNOSIS — E538 Deficiency of other specified B group vitamins: Secondary | ICD-10-CM | POA: Insufficient documentation

## 2022-09-29 LAB — CBC WITH DIFFERENTIAL (CANCER CENTER ONLY)
Abs Immature Granulocytes: 0.02 10*3/uL (ref 0.00–0.07)
Basophils Absolute: 0.1 10*3/uL (ref 0.0–0.1)
Basophils Relative: 1 %
Eosinophils Absolute: 0.1 10*3/uL (ref 0.0–0.5)
Eosinophils Relative: 1 %
HCT: 46 % (ref 36.0–46.0)
Hemoglobin: 15.8 g/dL — ABNORMAL HIGH (ref 12.0–15.0)
Immature Granulocytes: 0 %
Lymphocytes Relative: 22 %
Lymphs Abs: 1.7 10*3/uL (ref 0.7–4.0)
MCH: 30.4 pg (ref 26.0–34.0)
MCHC: 34.3 g/dL (ref 30.0–36.0)
MCV: 88.5 fL (ref 80.0–100.0)
Monocytes Absolute: 0.7 10*3/uL (ref 0.1–1.0)
Monocytes Relative: 9 %
Neutro Abs: 5.4 10*3/uL (ref 1.7–7.7)
Neutrophils Relative %: 67 %
Platelet Count: 328 10*3/uL (ref 150–400)
RBC: 5.2 MIL/uL — ABNORMAL HIGH (ref 3.87–5.11)
RDW: 12.2 % (ref 11.5–15.5)
WBC Count: 8.1 10*3/uL (ref 4.0–10.5)
nRBC: 0 % (ref 0.0–0.2)

## 2022-09-29 LAB — IRON AND IRON BINDING CAPACITY (CC-WL,HP ONLY)
Iron: 128 ug/dL (ref 28–170)
Saturation Ratios: 39 % — ABNORMAL HIGH (ref 10.4–31.8)
TIBC: 325 ug/dL (ref 250–450)
UIBC: 197 ug/dL (ref 148–442)

## 2022-09-29 LAB — FERRITIN: Ferritin: 34 ng/mL (ref 11–307)

## 2022-09-29 LAB — VITAMIN B12: Vitamin B-12: 306 pg/mL (ref 180–914)

## 2022-09-29 NOTE — Progress Notes (Signed)
Dawn Hamilton Follow up:    [name], NP [place] [address]   DIAGNOSIS: iron deficiency; b12 deficiency  SUMMARY OF HEMATOLOGIC HISTORY: 46 y.o. woman with h/o iron deficiency and b12 deficiency Iron Deficiency--receives feraheme intermittently Intermittent menorrhagia; on oral iron GI referral--upper endoscopy and colonoscopy, gastritis noted, protonix started 2.   B12 Deficiency             A.   + anti-parietal antibodies on 03/03/2022             B.   monthly b12 injections in 02/2022  CURRENT THERAPY: intermittent IV iron; oral iron, and b12 injections monthly  INTERVAL HISTORY: Dawn Hamilton 46 y.o. female returns for f/u and evaluation of her b12 and iron deficiency.  She underwent GI workup for her iron deficiency which demonstrated gastritis.  She is taking oral iron as needed.  She is taking her b12 injections monthly with good tolerance.  We are following her iron studies and b12 levels every 3 months.   Latest Reference Range & Units 05/27/19 14:42 09/17/19 11:25 12/15/19 07:36 12/17/20 07:31 02/24/22 08:11 06/05/22 07:30 09/29/22 09:06  Ferritin 11 - 307 ng/mL 5 (L) 26 16.0 8.1 (L) 5.8 (L) 65.6 34  (L): Data is abnormally low  Latest Reference Range & Units 12/04/18 08:03 02/13/19 08:30 05/27/19 14:42 09/17/19 11:25 12/15/19 07:36 12/17/20 07:31 02/24/22 08:11 06/05/22 07:30 09/29/22 09:05  Iron 28 - 170 ug/dL 16 (L) 20 (L) 22 (L) 119 241 (H) 70 61 45 128  UIBC 148 - 442 ug/dL   384 233     197  TIBC 250 - 450 ug/dL   406 352   396.2 324.8 325  Saturation Ratios 10.4 - 31.8 % 3.7 (L) 5.2 (L) 5 (L) 34 (H) 78.6 (H) 17.5 (L) 15.4 (L) 13.9 (L) 39 (H)  (L): Data is abnormally low (H): Data is abnormally high  Latest Reference Range & Units 05/27/19 14:42 12/15/19 07:36 04/20/20 07:39 12/17/20 07:31 02/24/22 08:11 06/05/22 07:30 09/29/22 09:05  Vitamin B12 180 - 914 pg/mL 206 163 (L) 213 1,257 (H) 222 436 306  (L): Data is  abnormally low (H): Data is abnormally high  Patient Active Problem List   Diagnosis Date Noted   Vitamin B12 deficiency 12/18/2019   Plant irritant contact dermatitis 06/17/2019   Iron deficiency anemia 12/09/2018   Preventative health care 12/04/2016   Seasonal allergic rhinitis 11/27/2014    is allergic to codeine, wound dressing adhesive, benzoin, and iodine.  MEDICAL HISTORY: Past Medical History:  Diagnosis Date   Allergy    Anxiety    Depression    Precancerous skin lesion     SURGICAL HISTORY: Past Surgical History:  Procedure Laterality Date   KNEE ARTHROSCOPY WITH MEDIAL COLLATERAL LIGAMENT RECONSTRUCTION Right 09/23/2020   Procedure: RIGHT KNEE ARTHROSCOPY DEBRIDMENT  WITH MEDIAL COLLATERAL LIGAMENT RECONSTRUCTION;  Surgeon: Hiram Gash, MD;  Location: [facility];  Service: Orthopedics;  Laterality: Right;   KNEE ARTHROSCOPY WITH MEDIAL PATELLAR FEMORAL LIGAMENT RECONSTRUCTION Right 10/02/2019   Procedure: KNEE ARTHROSCOPY WITH MEDIAL PATELLAR FEMORAL LIGAMENT RECONSTRUCTION WITH ALLOGRAPH;  Surgeon: Hiram Gash, MD;  Location: [facility];  Service: Orthopedics;  Laterality: Right;   KNEE SURGERY     x3    SOCIAL HISTORY: Social History   Socioeconomic History   Marital status: Married    Spouse name: Not on file   Number of children: Not  on file   Years of education: Not on file   Highest education level: Not on file  Occupational History   Not on file  Tobacco Use   Smoking status: Never   Smokeless tobacco: Never  Vaping Use   Vaping Use: Never used  Substance and Sexual Activity   Alcohol use: Yes   Drug use: Never   Sexual activity: Yes  Other Topics Concern   Not on file  Social History Narrative   Married.   2 children.   Works as a Psychologist, forensic.   Enjoys spending time outdoors, going to the lake.   Social Determinants of Health   Financial Resource Strain: Low Risk  (03/02/2022)   Overall Financial  Resource Strain (CARDIA)    Difficulty of Paying Living Expenses: Not hard at all  Food Insecurity: No Food Insecurity (03/02/2022)   Hunger Vital Sign    Worried About Running Out of Food in the Last Year: Never true    Ran Out of Food in the Last Year: Never true  Transportation Needs: No Transportation Needs (03/02/2022)   PRAPARE - Hydrologist (Medical): No    Lack of Transportation (Non-Medical): No  Physical Activity: Sufficiently Active (03/02/2022)   Exercise Vital Sign    Days of Exercise per Week: 6 days    Minutes of Exercise per Session: 30 min  Stress: No Stress Concern Present (03/02/2022)   [place]    Feeling of Stress : Not at all  Social Connections: IXL (03/02/2022)   Social Connection and Isolation Panel [NHANES]    Frequency of Communication with Friends and Family: More than three times a week    Frequency of Social Gatherings with Friends and Family: More than three times a week    Attends Religious Services: More than 4 times per year    Active Member of [employer] or Organizations: Yes    Attends Music therapist: More than 4 times per year    Marital Status: Married  Human resources officer Violence: Not At Risk (03/02/2022)   Humiliation, Afraid, Rape, and Kick questionnaire    Fear of Current or Ex-Partner: No    Emotionally Abused: No    Physically Abused: No    Sexually Abused: No    FAMILY HISTORY: Family History  Problem Relation Age of Onset   Healthy Mother    Healthy Father    Colon Hamilton Maternal Grandfather    Diabetes Maternal Grandfather    Bladder Hamilton Paternal Grandmother    Breast Hamilton Neg Hx     Review of Systems  Constitutional:  Negative for appetite change, chills, fatigue, fever and unexpected weight change.  HENT:   Negative for hearing loss, lump/mass and trouble swallowing.   Eyes:  Negative for eye problems  and icterus.  Respiratory:  Negative for chest tightness, cough and shortness of breath.   Cardiovascular:  Negative for chest pain, leg swelling and palpitations.  Gastrointestinal:  Negative for abdominal distention, abdominal pain, constipation, diarrhea, nausea and vomiting.  Endocrine: Negative for hot flashes.  Genitourinary:  Negative for difficulty urinating.   Musculoskeletal:  Negative for arthralgias.  Skin:  Negative for itching and rash.  Neurological:  Negative for dizziness, extremity weakness, headaches and numbness.  Hematological:  Negative for adenopathy. Does not bruise/bleed easily.  Psychiatric/Behavioral:  Negative for depression. The patient is not nervous/anxious.       PHYSICAL EXAMINATION  ECOG  PERFORMANCE STATUS: 0 - Asymptomatic  Vitals:   09/29/22 0814  BP: (!) 134/98  Pulse: 72  Resp: 16  Temp: (!) 97.3 F (36.3 C)  SpO2: 98%    Physical Exam Constitutional:      General: She is not in acute distress.    Appearance: Normal appearance. She is not toxic-appearing.  HENT:     Head: Normocephalic and atraumatic.  Eyes:     General: No scleral icterus. Cardiovascular:     Rate and Rhythm: Normal rate and regular rhythm.     Pulses: Normal pulses.     Heart sounds: Normal heart sounds.  Pulmonary:     Effort: Pulmonary effort is normal.     Breath sounds: Normal breath sounds.  Abdominal:     General: Abdomen is flat. Bowel sounds are normal. There is no distension.     Palpations: Abdomen is soft.     Tenderness: There is no abdominal tenderness.  Musculoskeletal:        General: No swelling.     Cervical back: Neck supple.  Lymphadenopathy:     Cervical: No cervical adenopathy.  Skin:    General: Skin is warm and dry.     Findings: No rash.  Neurological:     General: No focal deficit present.     Mental Status: She is alert.  Psychiatric:        Mood and Affect: Mood normal.        Behavior: Behavior normal.     LABORATORY  DATA:  CBC    Component Value Date/Time   WBC 8.1 09/29/2022 0905   WBC 5.7 02/24/2022 0811   RBC 5.20 (H) 09/29/2022 0905   HGB 15.8 (H) 09/29/2022 0905   HCT 46.0 09/29/2022 0905   PLT 328 09/29/2022 0905   MCV 88.5 09/29/2022 0905   MCH 30.4 09/29/2022 0905   MCHC 34.3 09/29/2022 0905   RDW 12.2 09/29/2022 0905   LYMPHSABS 1.7 09/29/2022 0905   MONOABS 0.7 09/29/2022 0905   EOSABS 0.1 09/29/2022 0905   BASOSABS 0.1 09/29/2022 0905    CMP     Component Value Date/Time   NA 136 02/24/2022 0811   K 4.5 02/24/2022 0811   CL 103 02/24/2022 0811   CO2 28 02/24/2022 0811   GLUCOSE 88 02/24/2022 0811   BUN 15 02/24/2022 0811   CREATININE 0.92 02/24/2022 0811   CALCIUM 9.0 02/24/2022 0811   PROT 6.1 02/24/2022 0811   ALBUMIN 4.0 02/24/2022 0811   AST 15 02/24/2022 0811   ALT 7 02/24/2022 0811   ALKPHOS 75 02/24/2022 0811   BILITOT 0.5 02/24/2022 0811     ASSESSMENT and THERAPY PLAN:   Iron deficiency anemia Will recheck iron studies today.  She is tolerating oral iron well and her chronic blood loss from heavy cycles and gastritis has decreased.    Will recheck in 3 months with her PCP.    Vitamin B12 deficiency We will recheck her b12 levels today.  She is tolerating her injections well and is receiving them every 4 weeks at home.  We will recheck her levels in 3 months as well with her pcp.     All questions were answered. The patient knows to call the clinic with any problems, questions or concerns. We can certainly see the patient much sooner if necessary.  Total encounter time:20 minutes*in face-to-face visit time, chart review, lab review, care coordination, order entry, and documentation of the encounter time.    Mendel Ryder  Delice Bison, NP 10/01/22 6:12 PM Medical Oncology and Hematology [facility] [place], [place] [address] Tel. [phone]    Fax. [phone]  *Total Encounter Time as defined by the Centers for Medicare and  Medicaid Services includes, in addition to the face-to-face time of a patient visit (documented in the note above) non-face-to-face time: obtaining and reviewing outside history, ordering and reviewing medications, tests or procedures, care coordination (communications with other health care professionals or caregivers) and documentation in the medical record.

## 2022-10-01 ENCOUNTER — Encounter: Payer: Self-pay | Admitting: Hematology and Oncology

## 2022-10-01 NOTE — Assessment & Plan Note (Addendum)
Will recheck iron studies today.  She is tolerating oral iron well and her chronic blood loss from heavy cycles and gastritis has decreased.    Will recheck in 3 months with her PCP.

## 2022-10-01 NOTE — Assessment & Plan Note (Signed)
We will recheck her b12 levels today.  She is tolerating her injections well and is receiving them every 4 weeks at home.  We will recheck her levels in 3 months as well with her pcp.

## 2022-10-17 DIAGNOSIS — E538 Deficiency of other specified B group vitamins: Secondary | ICD-10-CM

## 2022-10-17 DIAGNOSIS — D509 Iron deficiency anemia, unspecified: Secondary | ICD-10-CM

## 2022-10-26 ENCOUNTER — Encounter: Payer: Self-pay | Admitting: Hematology and Oncology

## 2022-10-28 ENCOUNTER — Encounter: Payer: Self-pay | Admitting: Hematology and Oncology

## 2022-12-11 ENCOUNTER — Other Ambulatory Visit: Payer: Self-pay

## 2022-12-11 ENCOUNTER — Encounter: Payer: Self-pay | Admitting: Hematology and Oncology

## 2022-12-18 ENCOUNTER — Other Ambulatory Visit (INDEPENDENT_AMBULATORY_CARE_PROVIDER_SITE_OTHER): Payer: 59

## 2022-12-18 DIAGNOSIS — D509 Iron deficiency anemia, unspecified: Secondary | ICD-10-CM | POA: Diagnosis not present

## 2022-12-18 DIAGNOSIS — E538 Deficiency of other specified B group vitamins: Secondary | ICD-10-CM | POA: Diagnosis not present

## 2022-12-18 LAB — IBC + FERRITIN
Ferritin: 33.4 ng/mL (ref 10.0–291.0)
Iron: 124 ug/dL (ref 42–145)
Saturation Ratios: 39 % (ref 20.0–50.0)
TIBC: 317.8 ug/dL (ref 250.0–450.0)
Transferrin: 227 mg/dL (ref 212.0–360.0)

## 2022-12-18 LAB — VITAMIN B12: Vitamin B-12: 346 pg/mL (ref 211–911)

## 2023-01-01 ENCOUNTER — Other Ambulatory Visit: Payer: Self-pay

## 2023-02-28 ENCOUNTER — Encounter: Payer: Self-pay | Admitting: Primary Care

## 2023-02-28 ENCOUNTER — Ambulatory Visit (INDEPENDENT_AMBULATORY_CARE_PROVIDER_SITE_OTHER): Payer: 59 | Admitting: Primary Care

## 2023-02-28 ENCOUNTER — Encounter: Payer: Self-pay | Admitting: Hematology and Oncology

## 2023-02-28 ENCOUNTER — Other Ambulatory Visit: Payer: Self-pay

## 2023-02-28 VITALS — BP 122/84 | HR 75 | Temp 97.5°F | Ht 69.0 in | Wt 190.0 lb

## 2023-02-28 DIAGNOSIS — D500 Iron deficiency anemia secondary to blood loss (chronic): Secondary | ICD-10-CM

## 2023-02-28 DIAGNOSIS — F411 Generalized anxiety disorder: Secondary | ICD-10-CM | POA: Diagnosis not present

## 2023-02-28 DIAGNOSIS — Z0001 Encounter for general adult medical examination with abnormal findings: Secondary | ICD-10-CM

## 2023-02-28 DIAGNOSIS — E538 Deficiency of other specified B group vitamins: Secondary | ICD-10-CM | POA: Diagnosis not present

## 2023-02-28 MED ORDER — SERTRALINE HCL 25 MG PO TABS
25.0000 mg | ORAL_TABLET | Freq: Every day | ORAL | 0 refills | Status: DC
  Filled 2023-02-28: qty 90, 90d supply, fill #0

## 2023-02-28 NOTE — Assessment & Plan Note (Signed)
Controlled.  Reviewed labs from February 2024. No longer on oral iron.
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
## 2023-02-28 NOTE — Patient Instructions (Signed)
Start Zoloft 25 mg once daily for anxiety.  Please update me in 4-6 weeks.   Have your labs collected as discussed.  It was a pleasure to see you today!

## 2023-02-28 NOTE — Assessment & Plan Note (Signed)
Uncontrolled.  Discussed options. Continue with therapy.  Start Zoloft 25 mg daily.  We discussed possible side effects of headache, GI upset, drowsiness. Patient verbalized understanding.   She will update via MyChart in 4-6 weeks.

## 2023-02-28 NOTE — Assessment & Plan Note (Signed)
Immunizations UTD. Pap smear UTD. Mammogram UTD Colonoscopy UTD, due 2028  Discussed the importance of a healthy diet and regular exercise in order for weight loss, and to reduce the risk of further co-morbidity.  Exam stable. Labs pending.  Follow up in 1 year for repeat physical.

## 2023-02-28 NOTE — Assessment & Plan Note (Signed)
Stable. Following with hematology. Continue B12 injections monthly.

## 2023-03-28 ENCOUNTER — Other Ambulatory Visit: Payer: Self-pay | Admitting: *Deleted

## 2023-03-28 DIAGNOSIS — E538 Deficiency of other specified B group vitamins: Secondary | ICD-10-CM

## 2023-03-28 DIAGNOSIS — D500 Iron deficiency anemia secondary to blood loss (chronic): Secondary | ICD-10-CM

## 2023-03-30 ENCOUNTER — Inpatient Hospital Stay: Payer: 59

## 2023-03-30 ENCOUNTER — Encounter: Payer: Self-pay | Admitting: Adult Health

## 2023-03-30 ENCOUNTER — Other Ambulatory Visit: Payer: Self-pay

## 2023-03-30 ENCOUNTER — Inpatient Hospital Stay: Payer: 59 | Attending: Adult Health | Admitting: Adult Health

## 2023-03-30 VITALS — BP 136/95 | HR 95 | Temp 97.9°F | Resp 17 | Wt 192.8 lb

## 2023-03-30 DIAGNOSIS — D500 Iron deficiency anemia secondary to blood loss (chronic): Secondary | ICD-10-CM

## 2023-03-30 DIAGNOSIS — E538 Deficiency of other specified B group vitamins: Secondary | ICD-10-CM | POA: Diagnosis not present

## 2023-03-30 DIAGNOSIS — Z800 Family history of malignant neoplasm of digestive organs: Secondary | ICD-10-CM | POA: Insufficient documentation

## 2023-03-30 DIAGNOSIS — Z8052 Family history of malignant neoplasm of bladder: Secondary | ICD-10-CM | POA: Diagnosis not present

## 2023-03-30 DIAGNOSIS — D509 Iron deficiency anemia, unspecified: Secondary | ICD-10-CM | POA: Diagnosis not present

## 2023-03-30 DIAGNOSIS — K2970 Gastritis, unspecified, without bleeding: Secondary | ICD-10-CM | POA: Diagnosis not present

## 2023-03-30 DIAGNOSIS — N920 Excessive and frequent menstruation with regular cycle: Secondary | ICD-10-CM | POA: Diagnosis not present

## 2023-03-30 LAB — CMP (CANCER CENTER ONLY)
ALT: 8 U/L (ref 0–44)
AST: 16 U/L (ref 15–41)
Albumin: 4.2 g/dL (ref 3.5–5.0)
Alkaline Phosphatase: 84 U/L (ref 38–126)
Anion gap: 7 (ref 5–15)
BUN: 19 mg/dL (ref 6–20)
CO2: 25 mmol/L (ref 22–32)
Calcium: 8.9 mg/dL (ref 8.9–10.3)
Chloride: 105 mmol/L (ref 98–111)
Creatinine: 0.92 mg/dL (ref 0.44–1.00)
GFR, Estimated: >60 mL/min (ref >60)
Glucose, Bld: 97 mg/dL (ref 70–99)
Potassium: 4.2 mmol/L (ref 3.5–5.1)
Sodium: 137 mmol/L (ref 135–145)
Total Bilirubin: 1 mg/dL (ref 0.3–1.2)
Total Protein: 6.7 g/dL (ref 6.5–8.1)

## 2023-03-30 LAB — IRON AND IRON BINDING CAPACITY (CC-WL,HP ONLY)
Iron: 155 ug/dL (ref 28–170)
Saturation Ratios: 47 % — ABNORMAL HIGH (ref 10.4–31.8)
TIBC: 333 ug/dL (ref 250–450)
UIBC: 178 ug/dL (ref 148–442)

## 2023-03-30 LAB — CBC WITH DIFFERENTIAL (CANCER CENTER ONLY)
Abs Immature Granulocytes: 0.04 10*3/uL (ref 0.00–0.07)
Basophils Absolute: 0.1 10*3/uL (ref 0.0–0.1)
Basophils Relative: 1 %
Eosinophils Absolute: 0.2 10*3/uL (ref 0.0–0.5)
Eosinophils Relative: 2 %
HCT: 44.6 % (ref 36.0–46.0)
Hemoglobin: 15.6 g/dL — ABNORMAL HIGH (ref 12.0–15.0)
Immature Granulocytes: 0 %
Lymphocytes Relative: 13 %
Lymphs Abs: 1.5 10*3/uL (ref 0.7–4.0)
MCH: 30.2 pg (ref 26.0–34.0)
MCHC: 35 g/dL (ref 30.0–36.0)
MCV: 86.4 fL (ref 80.0–100.0)
Monocytes Absolute: 0.8 10*3/uL (ref 0.1–1.0)
Monocytes Relative: 7 %
Neutro Abs: 8.3 10*3/uL — ABNORMAL HIGH (ref 1.7–7.7)
Neutrophils Relative %: 77 %
Platelet Count: 305 10*3/uL (ref 150–400)
RBC: 5.16 MIL/uL — ABNORMAL HIGH (ref 3.87–5.11)
RDW: 12.1 % (ref 11.5–15.5)
WBC Count: 10.8 10*3/uL — ABNORMAL HIGH (ref 4.0–10.5)
nRBC: 0 % (ref 0.0–0.2)

## 2023-03-30 LAB — VITAMIN B12: Vitamin B-12: 289 pg/mL (ref 180–914)

## 2023-03-30 LAB — FERRITIN: Ferritin: 31 ng/mL (ref 11–307)

## 2023-03-30 MED ORDER — CYANOCOBALAMIN 1000 MCG/ML IJ SOLN
1000.0000 ug | INTRAMUSCULAR | 3 refills | Status: DC
  Filled 2023-03-30: qty 3, 90d supply, fill #0
  Filled 2023-06-27: qty 3, 90d supply, fill #1
  Filled 2023-10-31: qty 3, 90d supply, fill #2

## 2023-03-30 NOTE — Assessment & Plan Note (Signed)
B12 deficiency with difficulty in oral absorption due to + anti-parietal antibodies.    Continue on monthly b12 IM injections Repeat b12 levels today.

## 2023-03-30 NOTE — Progress Notes (Signed)
[facility] Cancer Follow up:    Dawn Nest, NP [address] [place] [address]   DIAGNOSIS: Iron deficiency anemia, b12 deficiency  SUMMARY OF HEMATOLOGIC HISTORY: 47 y.o. woman with h/o iron deficiency and b12 deficiency Iron Deficiency--receives feraheme intermittently Intermittent menorrhagia; on oral iron GI referral--upper endoscopy and colonoscopy, gastritis noted, protonix started 2.   B12 Deficiency             A.   + anti-parietal antibodies on 03/03/2022             B.   monthly b12 injections in 02/2022    CURRENT THERAPY: intermittent IV iron, b12 injections  INTERVAL HISTORY: Dawn Hamilton 47 y.o. female returns for f/u of her iron and b12 deficiency.  She is doing well today.  She denies any significant fatigue.  She endorses weight gain and increased stress and was recently started on Zoloft by her PCP which she is tolerating well.  She denies any blood in her stool or black tarry stools.  She tolerates her b12 injections well.     Patient Active Problem List   Diagnosis Date Noted   GAD (generalized anxiety disorder) 02/28/2023   Vitamin B12 deficiency 12/18/2019   Plant irritant contact dermatitis 06/17/2019   Iron deficiency anemia 12/09/2018   Encounter for annual general medical examination with abnormal findings in adult 12/04/2016   Seasonal allergic rhinitis 11/27/2014    is allergic to codeine, wound dressing adhesive, benzoin, and iodine.  MEDICAL HISTORY: Past Medical History:  Diagnosis Date   Allergy    Anxiety    Depression    Precancerous skin lesion     SURGICAL HISTORY: Past Surgical History:  Procedure Laterality Date   KNEE ARTHROSCOPY WITH MEDIAL COLLATERAL LIGAMENT RECONSTRUCTION Right 09/23/2020   Procedure: RIGHT KNEE ARTHROSCOPY DEBRIDMENT  WITH MEDIAL COLLATERAL LIGAMENT RECONSTRUCTION;  Surgeon: Bjorn Pippin, MD;  Location: [facility];  Service: Orthopedics;  Laterality: Right;    KNEE ARTHROSCOPY WITH MEDIAL PATELLAR FEMORAL LIGAMENT RECONSTRUCTION Right 10/02/2019   Procedure: KNEE ARTHROSCOPY WITH MEDIAL PATELLAR FEMORAL LIGAMENT RECONSTRUCTION WITH ALLOGRAPH;  Surgeon: Bjorn Pippin, MD;  Location: [facility];  Service: Orthopedics;  Laterality: Right;   KNEE SURGERY     x3    SOCIAL HISTORY: Social History   Socioeconomic History   Marital status: Married    Spouse name: Not on file   Number of children: Not on file   Years of education: Not on file   Highest education level: Not on file  Occupational History   Not on file  Tobacco Use   Smoking status: Never   Smokeless tobacco: Never  Vaping Use   Vaping Use: Never used  Substance and Sexual Activity   Alcohol use: Yes   Drug use: Never   Sexual activity: Yes  Other Topics Concern   Not on file  Social History Narrative   Married.   2 children.   Works as a IT sales professional.   Enjoys spending time outdoors, going to the lake.   Social Determinants of Health   Financial Resource Strain: Low Risk  (03/02/2022)   Overall Financial Resource Strain (CARDIA)    Difficulty of Paying Living Expenses: Not hard at all  Food Insecurity: No Food Insecurity (03/02/2022)   Hunger Vital Sign    Worried About Running Out of Food in the Last Year: Never true    Ran Out of Food in the  Last Year: Never true  Transportation Needs: No Transportation Needs (03/02/2022)   PRAPARE - Administrator, Civil Service (Medical): No    Lack of Transportation (Non-Medical): No  Physical Activity: Sufficiently Active (03/02/2022)   Exercise Vital Sign    Days of Exercise per Week: 6 days    Minutes of Exercise per Session: 30 min  Stress: No Stress Concern Present (03/02/2022)   [employer] of Occupational Health - Occupational Stress Questionnaire    Feeling of Stress : Not at all  Social Connections: Socially Integrated (03/02/2022)   Social Connection and Isolation Panel [NHANES]     Frequency of Communication with Friends and Family: More than three times a week    Frequency of Social Gatherings with Friends and Family: More than three times a week    Attends Religious Services: More than 4 times per year    Active Member of [employer] or Organizations: Yes    Attends Engineer, structural: More than 4 times per year    Marital Status: Married  Catering manager Violence: Not At Risk (03/02/2022)   Humiliation, Afraid, Rape, and Kick questionnaire    Fear of Current or Ex-Partner: No    Emotionally Abused: No    Physically Abused: No    Sexually Abused: No    FAMILY HISTORY: Family History  Problem Relation Age of Onset   Healthy Mother    Healthy Father    Colon cancer Maternal Grandfather    Diabetes Maternal Grandfather    Bladder Cancer Paternal Grandmother    Breast cancer Neg Hx     Review of Systems  Constitutional:  Negative for appetite change, chills, fatigue, fever and unexpected weight change.  HENT:   Negative for hearing loss, lump/mass and trouble swallowing.   Eyes:  Negative for eye problems and icterus.  Respiratory:  Negative for chest tightness, cough and shortness of breath.   Cardiovascular:  Negative for chest pain, leg swelling and palpitations.  Gastrointestinal:  Negative for abdominal distention, abdominal pain, constipation, diarrhea, nausea and vomiting.  Endocrine: Negative for hot flashes.  Genitourinary:  Negative for difficulty urinating.   Musculoskeletal:  Negative for arthralgias.  Skin:  Negative for itching and rash.  Neurological:  Negative for dizziness, extremity weakness, headaches and numbness.  Hematological:  Negative for adenopathy. Does not bruise/bleed easily.  Psychiatric/Behavioral:  Negative for depression. The patient is not nervous/anxious.       PHYSICAL EXAMINATION    Vitals:   03/30/23 0846  BP: (!) 136/95  Pulse: 95  Resp: 17  Temp: 97.9 F (36.6 C)  SpO2: 98%    Physical  Exam Constitutional:      General: She is not in acute distress.    Appearance: Normal appearance. She is not toxic-appearing.  HENT:     Head: Normocephalic and atraumatic.  Eyes:     General: No scleral icterus. Cardiovascular:     Rate and Rhythm: Normal rate and regular rhythm.     Pulses: Normal pulses.     Heart sounds: Normal heart sounds.  Pulmonary:     Effort: Pulmonary effort is normal.     Breath sounds: Normal breath sounds.  Abdominal:     General: Abdomen is flat. Bowel sounds are normal. There is no distension.     Palpations: Abdomen is soft.     Tenderness: There is no abdominal tenderness.  Musculoskeletal:        General: No swelling.     Cervical  back: Neck supple.  Lymphadenopathy:     Cervical: No cervical adenopathy.  Skin:    General: Skin is warm and dry.     Findings: No rash.  Neurological:     General: No focal deficit present.     Mental Status: She is alert.  Psychiatric:        Mood and Affect: Mood normal.        Behavior: Behavior normal.     LABORATORY DATA:  CBC    Component Value Date/Time   WBC 10.8 (H) 03/30/2023 0810   WBC 5.7 02/24/2022 0811   RBC 5.16 (H) 03/30/2023 0810   HGB 15.6 (H) 03/30/2023 0810   HCT 44.6 03/30/2023 0810   PLT 305 03/30/2023 0810   MCV 86.4 03/30/2023 0810   MCH 30.2 03/30/2023 0810   MCHC 35.0 03/30/2023 0810   RDW 12.1 03/30/2023 0810   LYMPHSABS 1.5 03/30/2023 0810   MONOABS 0.8 03/30/2023 0810   EOSABS 0.2 03/30/2023 0810   BASOSABS 0.1 03/30/2023 0810    CMP     Component Value Date/Time   NA 137 03/30/2023 0810   K 4.2 03/30/2023 0810   CL 105 03/30/2023 0810   CO2 25 03/30/2023 0810   GLUCOSE 97 03/30/2023 0810   BUN 19 03/30/2023 0810   CREATININE 0.92 03/30/2023 0810   CALCIUM 8.9 03/30/2023 0810   PROT 6.7 03/30/2023 0810   ALBUMIN 4.2 03/30/2023 0810   AST 16 03/30/2023 0810   ALT 8 03/30/2023 0810   ALKPHOS 84 03/30/2023 0810   BILITOT 1.0 03/30/2023 0810   GFRNONAA  >60 03/30/2023 0810       ASSESSMENT and THERAPY PLAN:   Vitamin B12 deficiency B12 deficiency with difficulty in oral absorption due to + anti-parietal antibodies.    Continue on monthly b12 IM injections Repeat b12 levels today.    Iron deficiency anemia Iron deficiency likely secondary to gastritis.    Repeat iron studies today and every 3-6 months IV iron as indicated by iron studies WBC slightly increased today on lab testing, will recheck CBC with diff in 4 weeks.    Sherece will f/u in 6 months with labs prior.    All questions were answered. The patient knows to call the clinic with any problems, questions or concerns. We can certainly see the patient much sooner if necessary.  Total encounter time:30 minutes*in face-to-face visit time, chart review, lab review, care coordination, order entry, and documentation of the encounter time.    Lillard Anes, NP 03/30/23 9:43 AM Medical Oncology and Hematology [facility] [address] [place], [place] [address] Tel. [phone]    Fax. [phone]  *Total Encounter Time as defined by the Centers for Medicare and Medicaid Services includes, in addition to the face-to-face time of a patient visit (documented in the note above) non-face-to-face time: obtaining and reviewing outside history, ordering and reviewing medications, tests or procedures, care coordination (communications with other health care professionals or caregivers) and documentation in the medical record.

## 2023-03-30 NOTE — Assessment & Plan Note (Addendum)
Iron deficiency likely secondary to gastritis.    Repeat iron studies today and every 3-6 months IV iron as indicated by iron studies WBC slightly increased today on lab testing, will recheck CBC with diff in 4 weeks.    Kayce will f/u in 6 months with labs prior.

## 2023-04-02 ENCOUNTER — Other Ambulatory Visit: Payer: Self-pay | Admitting: Primary Care

## 2023-04-02 DIAGNOSIS — D500 Iron deficiency anemia secondary to blood loss (chronic): Secondary | ICD-10-CM

## 2023-04-24 ENCOUNTER — Other Ambulatory Visit (INDEPENDENT_AMBULATORY_CARE_PROVIDER_SITE_OTHER): Payer: 59

## 2023-04-24 ENCOUNTER — Other Ambulatory Visit: Payer: Self-pay

## 2023-04-24 ENCOUNTER — Telehealth: Payer: 59 | Admitting: Physician Assistant

## 2023-04-24 ENCOUNTER — Encounter: Payer: Self-pay | Admitting: Hematology and Oncology

## 2023-04-24 DIAGNOSIS — D500 Iron deficiency anemia secondary to blood loss (chronic): Secondary | ICD-10-CM

## 2023-04-24 DIAGNOSIS — H66001 Acute suppurative otitis media without spontaneous rupture of ear drum, right ear: Secondary | ICD-10-CM | POA: Diagnosis not present

## 2023-04-24 LAB — CBC WITH DIFFERENTIAL/PLATELET
Basophils Absolute: 0.1 10*3/uL (ref 0.0–0.1)
Basophils Relative: 1 % (ref 0.0–3.0)
Eosinophils Absolute: 0.1 10*3/uL (ref 0.0–0.7)
Eosinophils Relative: 1.6 % (ref 0.0–5.0)
HCT: 45.6 % (ref 36.0–46.0)
Hemoglobin: 15.1 g/dL — ABNORMAL HIGH (ref 12.0–15.0)
Lymphocytes Relative: 27.5 % (ref 12.0–46.0)
Lymphs Abs: 2.1 10*3/uL (ref 0.7–4.0)
MCHC: 33.1 g/dL (ref 30.0–36.0)
MCV: 89.9 fl (ref 78.0–100.0)
Monocytes Absolute: 0.6 10*3/uL (ref 0.1–1.0)
Monocytes Relative: 8.4 % (ref 3.0–12.0)
Neutro Abs: 4.7 10*3/uL (ref 1.4–7.7)
Neutrophils Relative %: 61.5 % (ref 43.0–77.0)
Platelets: 380 10*3/uL (ref 150.0–400.0)
RBC: 5.08 Mil/uL (ref 3.87–5.11)
RDW: 13.5 % (ref 11.5–15.5)
WBC: 7.7 10*3/uL (ref 4.0–10.5)

## 2023-04-24 LAB — FERRITIN: Ferritin: 32.3 ng/mL (ref 10.0–291.0)

## 2023-04-24 MED ORDER — CIPROFLOXACIN-DEXAMETHASONE 0.3-0.1 % OT SUSP
4.0000 [drp] | Freq: Two times a day (BID) | OTIC | 0 refills | Status: DC
  Filled 2023-04-24: qty 7.5, 19d supply, fill #0

## 2023-04-24 MED ORDER — AMOXICILLIN 875 MG PO TABS
875.0000 mg | ORAL_TABLET | Freq: Two times a day (BID) | ORAL | 0 refills | Status: AC
  Filled 2023-04-24: qty 20, 10d supply, fill #0

## 2023-04-24 NOTE — Progress Notes (Signed)
E-Visit for Ear Pain - Acute Otitis Media   We are sorry that you are not feeling well. Here is how we plan to help!  Based on what you have shared with me it looks like you have Acute Otitis Media.  Acute Otitis Media is an infection of the middle or "inner" ear. This type of infection can cause redness, inflammation, and fluid buildup behind the tympanic membrane (ear drum).  The usual symptoms include: Earache/Pain Fever Upper respiratory symptoms Lack of energy/Fatigue/Malaise Slight hearing loss gradually worsening- if the inner ear fills with fluid What causes middle ear infections? Most middle ear infections occur when an infection such as a cold, leads to a build-up of mucus in the middle ear and causes the Eustachian tube (a thin tube that runs from the middle ear to the back of the nose) to become swollen or blocked.   This means mucus can't drain away properly, making it easier for an infection to spread into the middle ear.  How middle ear infections are treated: Most ear infections clear up within three to five days and don't need any specific treatment. If necessary, tylenol or ibuprofen should be used to relieve pain and a high temperature.  If you develop a fever higher than 102, or any significantly worsening symptoms, this could indicate a more serious infection moving to the middle/inner and needs face to face evaluation in an office by a provider.   Antibiotics aren't routinely used to treat middle ear infections, although they may occasionally be prescribed if symptoms persist or are particularly severe. Given your presentation,   I have prescribed Amoxicillin 875 mg one tablet twice daily for 10 days AND Ciprodex ear drops Place 4 drops into affected ear twice daily for 7 days.   Your symptoms should improve over the next 3 days and should resolve in about 7 days. Be sure to complete ALL of the prescription(s) given.  HOME CARE: Wash your hands frequently. If you  are prescribed an ear drop, do not place the tip of the bottle on your ear or touch it with your fingers. You can take Acetaminophen 650 mg every 4-6 hours as needed for pain.  If pain is severe or moderate, you can apply a heating pad (set on low) or hot water bottle (wrapped in a towel) to outer ear for 20 minutes.  This will also increase drainage.  GET HELP RIGHT AWAY IF: Fever is over 102.2 degrees. You develop progressive ear pain or hearing loss. Ear symptoms persist longer than 3 days after treatment.  MAKE SURE YOU: Understand these instructions. Will watch your condition. Will get help right away if you are not doing well or get worse.  Thank you for choosing an e-visit.  Your e-visit answers were reviewed by a board certified advanced clinical practitioner to complete your personal care plan. Depending upon the condition, your plan could have included both over the counter or prescription medications.  Please review your pharmacy choice. Make sure the pharmacy is open so you can pick up the prescription now. If there is a problem, you may contact your provider through [employer] and have the prescription routed to another pharmacy.  Your safety is important to [place]. If you have drug allergies check your prescription carefully.   For the next 24 hours you can use MyChart to ask questions about today's visit, request a non-urgent call back, or ask for a work or school excuse. You will get an email with a  survey after your eVisit asking about your experience. We would appreciate your feedback. I hope that your e-visit has been valuable and will aid in your recovery.  I have spent 5 minutes in review of e-visit questionnaire, review and updating patient chart, medical decision making and response to patient.   Margaretann Loveless, PA-C

## 2023-04-25 ENCOUNTER — Other Ambulatory Visit: Payer: 59

## 2023-04-30 ENCOUNTER — Encounter: Payer: Self-pay | Admitting: Hematology and Oncology

## 2023-05-02 ENCOUNTER — Other Ambulatory Visit: Payer: Self-pay | Admitting: Oncology

## 2023-05-02 DIAGNOSIS — Z006 Encounter for examination for normal comparison and control in clinical research program: Secondary | ICD-10-CM

## 2023-05-22 ENCOUNTER — Other Ambulatory Visit: Payer: Self-pay | Admitting: Primary Care

## 2023-05-22 ENCOUNTER — Other Ambulatory Visit: Payer: Self-pay

## 2023-05-22 ENCOUNTER — Other Ambulatory Visit
Admission: RE | Admit: 2023-05-22 | Discharge: 2023-05-22 | Disposition: A | Discharge status: Home / Self Care | Payer: 59 | Source: Ambulatory Visit | Attending: Oncology | Admitting: Oncology

## 2023-05-22 DIAGNOSIS — Z006 Encounter for examination for normal comparison and control in clinical research program: Secondary | ICD-10-CM | POA: Insufficient documentation

## 2023-05-22 DIAGNOSIS — F411 Generalized anxiety disorder: Secondary | ICD-10-CM

## 2023-05-22 MED ORDER — SERTRALINE HCL 25 MG PO TABS
25.0000 mg | ORAL_TABLET | Freq: Every day | ORAL | 2 refills | Status: DC
  Filled 2023-05-22: qty 90, 90d supply, fill #0
  Filled 2023-08-22: qty 90, 90d supply, fill #1

## 2023-06-21 ENCOUNTER — Other Ambulatory Visit: Payer: Self-pay | Admitting: Primary Care

## 2023-06-21 DIAGNOSIS — Z1231 Encounter for screening mammogram for malignant neoplasm of breast: Secondary | ICD-10-CM

## 2023-08-06 ENCOUNTER — Ambulatory Visit
Admission: RE | Admit: 2023-08-06 | Discharge: 2023-08-06 | Disposition: A | Discharge status: Home / Self Care | Payer: 59 | Source: Ambulatory Visit | Attending: Primary Care | Admitting: Primary Care

## 2023-08-06 DIAGNOSIS — Z1231 Encounter for screening mammogram for malignant neoplasm of breast: Secondary | ICD-10-CM | POA: Insufficient documentation

## 2023-09-07 DIAGNOSIS — D2339 Other benign neoplasm of skin of other parts of face: Secondary | ICD-10-CM | POA: Diagnosis not present

## 2023-09-07 DIAGNOSIS — L821 Other seborrheic keratosis: Secondary | ICD-10-CM | POA: Diagnosis not present

## 2023-09-22 ENCOUNTER — Telehealth: Payer: 59 | Admitting: Family Medicine

## 2023-09-22 DIAGNOSIS — J019 Acute sinusitis, unspecified: Secondary | ICD-10-CM | POA: Diagnosis not present

## 2023-09-22 MED ORDER — AMOXICILLIN-POT CLAVULANATE 875-125 MG PO TABS: 1.0000 | ORAL_TABLET | Freq: Two times a day (BID) | ORAL | 0 refills | Status: DC

## 2023-09-22 NOTE — Progress Notes (Signed)

## 2023-09-24 ENCOUNTER — Encounter: Payer: Self-pay | Admitting: Adult Health

## 2023-09-25 ENCOUNTER — Other Ambulatory Visit: Payer: Self-pay | Admitting: Adult Health

## 2023-09-25 DIAGNOSIS — R635 Abnormal weight gain: Secondary | ICD-10-CM

## 2023-09-25 DIAGNOSIS — E538 Deficiency of other specified B group vitamins: Secondary | ICD-10-CM

## 2023-09-25 DIAGNOSIS — D500 Iron deficiency anemia secondary to blood loss (chronic): Secondary | ICD-10-CM

## 2023-10-01 ENCOUNTER — Inpatient Hospital Stay: Payer: 59 | Admitting: Adult Health

## 2023-10-01 ENCOUNTER — Inpatient Hospital Stay: Payer: 59 | Attending: Adult Health

## 2023-10-01 ENCOUNTER — Other Ambulatory Visit: Payer: Self-pay | Admitting: Adult Health

## 2023-10-01 ENCOUNTER — Other Ambulatory Visit: Payer: Self-pay

## 2023-10-01 DIAGNOSIS — E538 Deficiency of other specified B group vitamins: Secondary | ICD-10-CM | POA: Insufficient documentation

## 2023-10-01 DIAGNOSIS — N920 Excessive and frequent menstruation with regular cycle: Secondary | ICD-10-CM | POA: Diagnosis not present

## 2023-10-01 DIAGNOSIS — D500 Iron deficiency anemia secondary to blood loss (chronic): Secondary | ICD-10-CM

## 2023-10-01 DIAGNOSIS — Z79899 Other long term (current) drug therapy: Secondary | ICD-10-CM | POA: Insufficient documentation

## 2023-10-01 DIAGNOSIS — D509 Iron deficiency anemia, unspecified: Secondary | ICD-10-CM | POA: Insufficient documentation

## 2023-10-01 DIAGNOSIS — R635 Abnormal weight gain: Secondary | ICD-10-CM

## 2023-10-01 LAB — CBC WITH DIFFERENTIAL (CANCER CENTER ONLY)
Abs Immature Granulocytes: 0.02 10*3/uL (ref 0.00–0.07)
Basophils Absolute: 0.1 10*3/uL (ref 0.0–0.1)
Basophils Relative: 1 %
Eosinophils Absolute: 0.3 10*3/uL (ref 0.0–0.5)
Eosinophils Relative: 3 %
HCT: 44.3 % (ref 36.0–46.0)
Hemoglobin: 15.4 g/dL — ABNORMAL HIGH (ref 12.0–15.0)
Immature Granulocytes: 0 %
Lymphocytes Relative: 30 %
Lymphs Abs: 2.3 10*3/uL (ref 0.7–4.0)
MCH: 30.3 pg (ref 26.0–34.0)
MCHC: 34.8 g/dL (ref 30.0–36.0)
MCV: 87 fL (ref 80.0–100.0)
Monocytes Absolute: 0.6 10*3/uL (ref 0.1–1.0)
Monocytes Relative: 8 %
Neutro Abs: 4.4 10*3/uL (ref 1.7–7.7)
Neutrophils Relative %: 58 %
Platelet Count: 363 10*3/uL (ref 150–400)
RBC: 5.09 MIL/uL (ref 3.87–5.11)
RDW: 12.2 % (ref 11.5–15.5)
WBC Count: 7.7 10*3/uL (ref 4.0–10.5)
nRBC: 0 % (ref 0.0–0.2)

## 2023-10-01 LAB — IRON AND IRON BINDING CAPACITY (CC-WL,HP ONLY)
Iron: 125 ug/dL (ref 28–170)
Saturation Ratios: 36 % — ABNORMAL HIGH (ref 10.4–31.8)
TIBC: 343 ug/dL (ref 250–450)
UIBC: 218 ug/dL (ref 148–442)

## 2023-10-01 LAB — CMP (CANCER CENTER ONLY)
ALT: 12 U/L (ref 0–44)
AST: 17 U/L (ref 15–41)
Albumin: 4.1 g/dL (ref 3.5–5.0)
Alkaline Phosphatase: 77 U/L (ref 38–126)
Anion gap: 6 (ref 5–15)
BUN: 15 mg/dL (ref 6–20)
CO2: 25 mmol/L (ref 22–32)
Calcium: 9.2 mg/dL (ref 8.9–10.3)
Chloride: 106 mmol/L (ref 98–111)
Creatinine: 0.92 mg/dL (ref 0.44–1.00)
GFR, Estimated: >60 mL/min (ref >60)
Glucose, Bld: 111 mg/dL — ABNORMAL HIGH (ref 70–99)
Potassium: 4.1 mmol/L (ref 3.5–5.1)
Sodium: 137 mmol/L (ref 135–145)
Total Bilirubin: 0.6 mg/dL (ref <1.2)
Total Protein: 6.8 g/dL (ref 6.5–8.1)

## 2023-10-01 LAB — T4, FREE: Free T4: 0.86 ng/dL (ref 0.61–1.12)

## 2023-10-01 LAB — VITAMIN B12: Vitamin B-12: 365 pg/mL (ref 180–914)

## 2023-10-01 LAB — FERRITIN: Ferritin: 20 ng/mL (ref 11–307)

## 2023-10-01 LAB — TSH: TSH: 2.141 u[IU]/mL (ref 0.350–4.500)

## 2023-10-02 LAB — FOLLICLE STIMULATING HORMONE: FSH: 4.3 m[IU]/mL

## 2023-10-02 LAB — LUTEINIZING HORMONE: LH: 10.5 m[IU]/mL

## 2023-10-05 ENCOUNTER — Encounter: Payer: Self-pay | Admitting: Adult Health

## 2023-10-05 ENCOUNTER — Inpatient Hospital Stay (HOSPITAL_BASED_OUTPATIENT_CLINIC_OR_DEPARTMENT_OTHER): Payer: 59 | Admitting: Adult Health

## 2023-10-05 DIAGNOSIS — E538 Deficiency of other specified B group vitamins: Secondary | ICD-10-CM | POA: Diagnosis not present

## 2023-10-05 DIAGNOSIS — D500 Iron deficiency anemia secondary to blood loss (chronic): Secondary | ICD-10-CM

## 2023-10-05 NOTE — Assessment & Plan Note (Signed)
Vitamin B12 Deficiency Patient is on B12 injections with improved levels (365 from previous lower levels). No significant change in symptoms noted. Hematologist reviewed levels and recommended continuation of current regimen. -Continue B12 injections as currently prescribed.

## 2023-10-05 NOTE — Progress Notes (Signed)
[facility] Cancer Follow up:    Dawn Nest, NP [address] [place] [address]  I connected with Dawn Hamilton  on 10/05/23 at  1:45 PM EST by telephone and verified that I am speaking with the correct person using two identifiers.  I discussed the limitations, risks, security and privacy concerns of performing an evaluation and management service by telephone and the availability of in person appointments.  I also discussed with the patient that there may be a patient responsible charge related to this service. The patient expressed understanding and agreed to proceed.  Patient location: home Provider location: [facility] office  DIAGNOSIS: Iron deficiency anemia, b12 deficiency  SUMMARY OF HEMATOLOGIC HISTORY: 47 y.o. woman with h/o iron deficiency and b12 deficiency Iron Deficiency--receives feraheme intermittently Intermittent menorrhagia; intermittent IV iron GI referral--upper endoscopy and colonoscopy, gastritis noted, protonix started 2.   B12 Deficiency             A.   + anti-parietal antibodies on 03/03/2022             B.   monthly b12 injections in 02/2022   CURRENT THERAPY: intermittent IV iron, b12 injections  INTERVAL HISTORY: Discussed the use of AI scribe software for clinical note transcription with the patient, who gave verbal consent to proceed.  Dawn Hamilton 47 y.o. female  a history of iron deficiency and vitamin B12 deficiency, reports not taking oral iron supplements but continues with B12 injections. She has not noticed any significant changes since starting the B12 injections. Recent blood work shows improved B12 levels and stable iron levels.   The patient also reports a recent change in menstrual cycles, with two cycles occurring back-to-back. She has a family history of fibroid tumors and heavy periods leading to a hysterectomy in her mother at age 31. The patient has been tracking her cycles closely due to these  changes.   Patient Active Problem List   Diagnosis Date Noted   GAD (generalized anxiety disorder) 02/28/2023   Vitamin B12 deficiency 12/18/2019   Plant irritant contact dermatitis 06/17/2019   Iron deficiency anemia 12/09/2018   Encounter for annual general medical examination with abnormal findings in adult 12/04/2016   Seasonal allergic rhinitis 11/27/2014    is allergic to codeine, wound dressing adhesive, benzoin, and iodine.  MEDICAL HISTORY: Past Medical History:  Diagnosis Date   Allergy    Anxiety    Depression    Precancerous skin lesion     SURGICAL HISTORY: Past Surgical History:  Procedure Laterality Date   KNEE ARTHROSCOPY WITH MEDIAL COLLATERAL LIGAMENT RECONSTRUCTION Right 09/23/2020   Procedure: RIGHT KNEE ARTHROSCOPY DEBRIDMENT  WITH MEDIAL COLLATERAL LIGAMENT RECONSTRUCTION;  Surgeon: Bjorn Pippin, MD;  Location: [facility];  Service: Orthopedics;  Laterality: Right;   KNEE ARTHROSCOPY WITH MEDIAL PATELLAR FEMORAL LIGAMENT RECONSTRUCTION Right 10/02/2019   Procedure: KNEE ARTHROSCOPY WITH MEDIAL PATELLAR FEMORAL LIGAMENT RECONSTRUCTION WITH ALLOGRAPH;  Surgeon: Bjorn Pippin, MD;  Location: [facility];  Service: Orthopedics;  Laterality: Right;   KNEE SURGERY     x3    SOCIAL HISTORY: Social History   Socioeconomic History   Marital status: Married    Spouse name: Not on file   Number of children: Not on file   Years of education: Not on file   Highest education level: Not on file  Occupational History   Not on file  Tobacco Use   Smoking status: Never  Smokeless tobacco: Never  Vaping Use   Vaping status: Never Used  Substance and Sexual Activity   Alcohol use: Yes   Drug use: Never   Sexual activity: Yes  Other Topics Concern   Not on file  Social History Narrative   Married.   2 children.   Works as a IT sales professional.   Enjoys spending time outdoors, going to the lake.   Social Determinants of Health    Financial Resource Strain: Low Risk  (03/02/2022)   Overall Financial Resource Strain (CARDIA)    Difficulty of Paying Living Expenses: Not hard at all  Food Insecurity: No Food Insecurity (03/02/2022)   Hunger Vital Sign    Worried About Running Out of Food in the Last Year: Never true    Ran Out of Food in the Last Year: Never true  Transportation Needs: No Transportation Needs (03/02/2022)   PRAPARE - Administrator, Civil Service (Medical): No    Lack of Transportation (Non-Medical): No  Physical Activity: Sufficiently Active (03/02/2022)   Exercise Vital Sign    Days of Exercise per Week: 6 days    Minutes of Exercise per Session: 30 min  Stress: No Stress Concern Present (03/02/2022)   [employer] of Occupational Health - Occupational Stress Questionnaire    Feeling of Stress : Not at all  Social Connections: Socially Integrated (03/02/2022)   Social Connection and Isolation Panel [NHANES]    Frequency of Communication with Friends and Family: More than three times a week    Frequency of Social Gatherings with Friends and Family: More than three times a week    Attends Religious Services: More than 4 times per year    Active Member of [employer] or Organizations: Yes    Attends Engineer, structural: More than 4 times per year    Marital Status: Married  Catering manager Violence: Not At Risk (03/02/2022)   Humiliation, Afraid, Rape, and Kick questionnaire    Fear of Current or Ex-Partner: No    Emotionally Abused: No    Physically Abused: No    Sexually Abused: No    FAMILY HISTORY: Family History  Problem Relation Age of Onset   Healthy Mother    Healthy Father    Colon cancer Maternal Grandfather    Diabetes Maternal Grandfather    Bladder Cancer Paternal Grandmother    Breast cancer Neg Hx     Review of Systems  Constitutional:  Negative for appetite change, chills, fatigue, fever and unexpected weight change.  HENT:   Negative for hearing  loss, lump/mass and trouble swallowing.   Eyes:  Negative for eye problems and icterus.  Respiratory:  Negative for chest tightness, cough and shortness of breath.   Cardiovascular:  Negative for chest pain, leg swelling and palpitations.  Gastrointestinal:  Negative for abdominal distention, abdominal pain, constipation, diarrhea, nausea and vomiting.  Endocrine: Negative for hot flashes.  Genitourinary:  Negative for difficulty urinating.   Musculoskeletal:  Negative for arthralgias.  Skin:  Negative for itching and rash.  Neurological:  Negative for dizziness, extremity weakness, headaches and numbness.  Hematological:  Negative for adenopathy. Does not bruise/bleed easily.  Psychiatric/Behavioral:  Negative for depression. The patient is not nervous/anxious.       PHYSICAL EXAMINATION  Patient sounds well.  She is in no apparent distress.  Mood and behavior are normal.   LABORATORY DATA:  CBC    Component Value Date/Time   WBC 7.7 10/01/2023 0809   WBC  7.7 04/24/2023 0919   RBC 5.09 10/01/2023 0809   HGB 15.4 (H) 10/01/2023 0809   HCT 44.3 10/01/2023 0809   PLT 363 10/01/2023 0809   MCV 87.0 10/01/2023 0809   MCH 30.3 10/01/2023 0809   MCHC 34.8 10/01/2023 0809   RDW 12.2 10/01/2023 0809   LYMPHSABS 2.3 10/01/2023 0809   MONOABS 0.6 10/01/2023 0809   EOSABS 0.3 10/01/2023 0809   BASOSABS 0.1 10/01/2023 0809    CMP     Component Value Date/Time   NA 137 10/01/2023 0809   K 4.1 10/01/2023 0809   CL 106 10/01/2023 0809   CO2 25 10/01/2023 0809   GLUCOSE 111 (H) 10/01/2023 0809   BUN 15 10/01/2023 0809   CREATININE 0.92 10/01/2023 0809   CALCIUM 9.2 10/01/2023 0809   PROT 6.8 10/01/2023 0809   ALBUMIN 4.1 10/01/2023 0809   AST 17 10/01/2023 0809   ALT 12 10/01/2023 0809   ALKPHOS 77 10/01/2023 0809   BILITOT 0.6 10/01/2023 0809   GFRNONAA >60 10/01/2023 0809       ASSESSMENT and THERAPY PLAN:   Vitamin B12 deficiency Vitamin B12 Deficiency Patient  is on B12 injections with improved levels (365 from previous lower levels). No significant change in symptoms noted. Hematologist reviewed levels and recommended continuation of current regimen. -Continue B12 injections as currently prescribed.  Iron deficiency anemia Iron Deficiency Ferritin levels have decreased from 65 to 34 to 20, but hemoglobin levels remain stable and other iron levels are satisfactory. No current symptoms of anemia. -Monitor iron levels. -Recheck labs in 6 months with primary care provider Jae Dire).  Menstrual Irregularities Patient reported back-to-back menstrual cycles. No indication of menopause from recent hormone studies. Patient has family history of fibroid tumors and heavy periods. -Continue monitoring menstrual cycle closely. -Follow up next year or sooner if menstrual irregularities persist.   The patient was provided an opportunity to ask questions and all were answered. The patient agreed with the plan and demonstrated an understanding of the instructions.   The patient was advised to call back or seek an in-person evaluation if the symptoms worsen or if the condition fails to improve as anticipated.   I provided 15 minutes of non face-to-face telephone visit time during this encounter, and > 50% was spent counseling as documented under my assessment & plan.   Lillard Anes, NP 10/05/23 2:03 PM Medical Oncology and Hematology [facility] [address] [place], [place] [address] Tel. [phone]    Fax. [phone]  *Total Encounter Time as defined by the Centers for Medicare and Medicaid Services includes, in addition to the face-to-face time of a patient visit (documented in the note above) non-face-to-face time: obtaining and reviewing outside history, ordering and reviewing medications, tests or procedures, care coordination (communications with other health care professionals or caregivers) and documentation in the medical  record.

## 2023-10-05 NOTE — Assessment & Plan Note (Signed)
Iron Deficiency Ferritin levels have decreased from 65 to 34 to 20, but hemoglobin levels remain stable and other iron levels are satisfactory. No current symptoms of anemia. -Monitor iron levels. -Recheck labs in 6 months with primary care provider Jae Dire).  Menstrual Irregularities Patient reported back-to-back menstrual cycles. No indication of menopause from recent hormone studies. Patient has family history of fibroid tumors and heavy periods. -Continue monitoring menstrual cycle closely. -Follow up next year or sooner if menstrual irregularities persist.

## 2023-10-07 LAB — ESTRADIOL, ULTRA SENS: Estradiol, Sensitive: 144.4 pg/mL

## 2023-10-08 DIAGNOSIS — H524 Presbyopia: Secondary | ICD-10-CM | POA: Diagnosis not present

## 2023-12-18 DIAGNOSIS — K219 Gastro-esophageal reflux disease without esophagitis: Secondary | ICD-10-CM

## 2023-12-19 ENCOUNTER — Other Ambulatory Visit: Payer: Self-pay

## 2023-12-19 MED ORDER — PANTOPRAZOLE SODIUM 20 MG PO TBEC
20.0000 mg | DELAYED_RELEASE_TABLET | Freq: Every day | ORAL | 0 refills | Status: DC
  Filled 2023-12-19: qty 90, 90d supply, fill #0

## 2024-02-22 ENCOUNTER — Other Ambulatory Visit: Payer: Self-pay

## 2024-02-22 ENCOUNTER — Telehealth: Admitting: Physician Assistant

## 2024-02-22 DIAGNOSIS — H6991 Unspecified Eustachian tube disorder, right ear: Secondary | ICD-10-CM

## 2024-02-22 MED ORDER — PREDNISONE 20 MG PO TABS
40.0000 mg | ORAL_TABLET | Freq: Every day | ORAL | 0 refills | Status: DC
  Filled 2024-02-22: qty 10, 5d supply, fill #0

## 2024-02-22 MED ORDER — FLUTICASONE PROPIONATE 50 MCG/ACT NA SUSP
2.0000 | Freq: Every day | NASAL | 0 refills | Status: AC
  Filled 2024-02-22: qty 16, 30d supply, fill #0

## 2024-02-22 NOTE — Progress Notes (Signed)
 E-Visit for Ear Pain - Eustachian Tube Dysfunction   We are sorry that you are not feeling well. Here is how we plan to help!  Based on what you have shared with me it looks like you have Eustachian Tube Dysfunction.  Eustachian Tube Dysfunction is a condition where the tubes that connect your middle ears to your upper throat become blocked. This can lead to discomfort, hearing difficulties and a feeling of fullness in your ear. Eustachian tube dysfunction usually resolves itself in a few days. The usual symptoms include: Hearing problems Tinnitus, or ringing in your ears Clicking or popping sounds A feeling of fullness in your ears Pain that mimics an ear infection Dizziness, vertigo or balance problems A "tickling" sensation in your ears  ?Eustachian tube dysfunction symptoms may get worse in higher altitudes. This is called barotrauma, and it can happen while scuba diving, flying in an airplane or driving in the mountains.   What causes eustachian tube dysfunction? Allergies and infections (like the common cold and the flu) are the most common causes of eustachian tube dysfunction. These conditions can cause inflammation and mucus buildup, leading to blockage. GERD, or chronic acid reflux, can also cause ETD. This is because stomach acid can back up into your throat and result in inflammation. As mentioned above, altitude changes can also cause ETD.   What are some common eustachian tube dysfunction treatments? In most cases, treatment isn't necessary because ETD often resolves on its own. However, you might need treatment if your symptoms linger for more than two weeks.    Eustachian tube dysfunction treatment depends on the cause and the severity of your condition. Treatments may include home remedies, medications or, in severe cases, surgery.     HOME CARE: Sometimes simple home remedies can help with mild cases of eustachian tube dysfunction. To try and clear the blockage, you  can: Chew gum. Yawn. Swallow. Try the Valsalva maneuver (breathing out forcefully while closing your mouth and pinching your nostrils). Use a saline spray to clear out nasal passages.  MEDICATIONS: Over-the-counter medications can help if allergies are causing eustachian tube dysfunction. Try antihistamines (like cetirizine or diphenhydramine) to ease your symptoms. If you have discomfort, pain relievers -- such as acetaminophen or ibuprofen -- can help.  Sometimes intranasal glucocorticosteroids (like Flonase or Nasacort) help.  I have prescribed Fluticasone 50 mcg/spray 2 sprays in each nostril daily for 10-14 days  I have also prescribed Prednisone 20mg  Take 2 tablets (40mg ) daily for 5 days to help lessen inflammation and pain, and to allow the Flonase to start working for you.    GET HELP RIGHT AWAY IF: Fever is over 102.2 degrees. You develop progressive ear pain or hearing loss. Ear symptoms persist longer than 3 days after treatment.  MAKE SURE YOU: Understand these instructions. Will watch your condition. Will get help right away if you are not doing well or get worse.  Thank you for choosing an e-visit.  Your e-visit answers were reviewed by a board certified advanced clinical practitioner to complete your personal care plan. Depending upon the condition, your plan could have included both over the counter or prescription medications.  Please review your pharmacy choice. Make sure the pharmacy is open so you can pick up the prescription now. If there is a problem, you may contact your provider through [employer] and have the prescription routed to another pharmacy.  Your safety is important to [place]. If you have drug allergies check your prescription carefully.  For the next 24 hours you can use MyChart to ask questions about today's visit, request a non-urgent call back, or ask for a work or school excuse. You will get an email with a survey after your eVisit asking  about your experience. We would appreciate your feedback. I hope that your e-visit has been valuable and will aid in your recovery.     I have spent 5 minutes in review of e-visit questionnaire, review and updating patient chart, medical decision making and response to patient.   Margaretann Loveless, PA-C

## 2024-02-29 ENCOUNTER — Encounter: Payer: 59 | Admitting: Primary Care

## 2024-03-04 ENCOUNTER — Encounter: Payer: 59 | Admitting: Primary Care

## 2024-03-06 ENCOUNTER — Other Ambulatory Visit: Payer: Self-pay | Admitting: Primary Care

## 2024-03-06 DIAGNOSIS — K219 Gastro-esophageal reflux disease without esophagitis: Secondary | ICD-10-CM

## 2024-03-06 MED ORDER — PANTOPRAZOLE SODIUM 20 MG PO TBEC
20.0000 mg | DELAYED_RELEASE_TABLET | Freq: Every day | ORAL | 0 refills | Status: DC
  Filled 2024-03-06: qty 90, 90d supply, fill #0

## 2024-03-07 ENCOUNTER — Other Ambulatory Visit: Payer: Self-pay

## 2024-03-10 ENCOUNTER — Other Ambulatory Visit: Payer: Self-pay

## 2024-03-19 ENCOUNTER — Ambulatory Visit: Admitting: Primary Care

## 2024-03-19 ENCOUNTER — Encounter: Payer: Self-pay | Admitting: Primary Care

## 2024-03-19 VITALS — BP 106/64 | HR 75 | Temp 97.3°F | Ht 69.0 in | Wt 199.0 lb

## 2024-03-19 DIAGNOSIS — D500 Iron deficiency anemia secondary to blood loss (chronic): Secondary | ICD-10-CM | POA: Diagnosis not present

## 2024-03-19 DIAGNOSIS — Z0000 Encounter for general adult medical examination without abnormal findings: Secondary | ICD-10-CM | POA: Diagnosis not present

## 2024-03-19 DIAGNOSIS — K219 Gastro-esophageal reflux disease without esophagitis: Secondary | ICD-10-CM | POA: Diagnosis not present

## 2024-03-19 DIAGNOSIS — E538 Deficiency of other specified B group vitamins: Secondary | ICD-10-CM

## 2024-03-19 DIAGNOSIS — F411 Generalized anxiety disorder: Secondary | ICD-10-CM | POA: Diagnosis not present

## 2024-03-19 LAB — COMPREHENSIVE METABOLIC PANEL WITH GFR
ALT: 11 U/L (ref 0–35)
AST: 16 U/L (ref 0–37)
Albumin: 4.4 g/dL (ref 3.5–5.2)
Alkaline Phosphatase: 80 U/L (ref 39–117)
BUN: 16 mg/dL (ref 6–23)
CO2: 29 meq/L (ref 19–32)
Calcium: 9.6 mg/dL (ref 8.4–10.5)
Chloride: 103 meq/L (ref 96–112)
Creatinine, Ser: 0.89 mg/dL (ref 0.40–1.20)
GFR: 77.85 mL/min (ref >60.00)
Glucose, Bld: 90 mg/dL (ref 70–99)
Potassium: 4.6 meq/L (ref 3.5–5.1)
Sodium: 139 meq/L (ref 135–145)
Total Bilirubin: 0.7 mg/dL (ref 0.2–1.2)
Total Protein: 6.7 g/dL (ref 6.0–8.3)

## 2024-03-19 LAB — LIPID PANEL
Cholesterol: 203 mg/dL — ABNORMAL HIGH (ref 0–200)
HDL: 59.6 mg/dL (ref >39.00)
LDL Cholesterol: 124 mg/dL — ABNORMAL HIGH (ref 0–99)
NonHDL: 143.12
Total CHOL/HDL Ratio: 3
Triglycerides: 98 mg/dL (ref 0.0–149.0)
VLDL: 19.6 mg/dL (ref 0.0–40.0)

## 2024-03-19 LAB — CBC
HCT: 47.1 % — ABNORMAL HIGH (ref 36.0–46.0)
Hemoglobin: 15.6 g/dL — ABNORMAL HIGH (ref 12.0–15.0)
MCHC: 33.2 g/dL (ref 30.0–36.0)
MCV: 89 fl (ref 78.0–100.0)
Platelets: 374 10*3/uL (ref 150.0–400.0)
RBC: 5.29 Mil/uL — ABNORMAL HIGH (ref 3.87–5.11)
RDW: 13.3 % (ref 11.5–15.5)
WBC: 6.5 10*3/uL (ref 4.0–10.5)

## 2024-03-19 LAB — IBC + FERRITIN
Ferritin: 14.4 ng/mL (ref 10.0–291.0)
Iron: 118 ug/dL (ref 42–145)
Saturation Ratios: 34.4 % (ref 20.0–50.0)
TIBC: 343 ug/dL (ref 250.0–450.0)
Transferrin: 245 mg/dL (ref 212.0–360.0)

## 2024-03-19 LAB — HEMOGLOBIN A1C: Hgb A1c MFr Bld: 5.8 % (ref 4.6–6.5)

## 2024-03-19 LAB — VITAMIN B12: Vitamin B-12: 385 pg/mL (ref 211–911)

## 2024-03-19 NOTE — Assessment & Plan Note (Signed)
 Controlled and improved.   Remain off Zoloft . Continue to monitor.

## 2024-03-19 NOTE — Assessment & Plan Note (Signed)
 Repeat B12 level pending.  Continue B12 injections monthly.

## 2024-03-19 NOTE — Assessment & Plan Note (Signed)
Repeat iron studies pending.

## 2024-03-19 NOTE — Patient Instructions (Signed)
 Stop by the lab prior to leaving today. I will notify you of your results once received.   It was a pleasure to see you today!

## 2024-03-19 NOTE — Assessment & Plan Note (Signed)
Controlled.  Continue pantoprazole 20 mg daily.  

## 2024-03-19 NOTE — Progress Notes (Signed)
 Subjective:    Patient ID: Dawn [place], female    DOB: 07-25-1976, 48 y.o.   MRN: [id number]  HPI  Dawn [place] is a very pleasant 48 y.o. female who presents today for complete physical and follow up of chronic conditions.  Immunizations: -Tetanus: Completed in 2022  Diet: Fair diet.  Exercise: Active.  Eye exam: Completes annually  Dental exam: Completes semi-annually    Pap Smear: Completed in 2023 Mammogram: Completed in September 2024  Colonoscopy: Completed in 2023, due 2028      Review of Systems  Constitutional:  Negative for unexpected weight change.  HENT:  Negative for rhinorrhea.   Respiratory:  Negative for cough and shortness of breath.   Cardiovascular:  Negative for chest pain.  Gastrointestinal:  Negative for constipation and diarrhea.  Genitourinary:  Negative for difficulty urinating and menstrual problem.  Musculoskeletal:  Negative for arthralgias and myalgias.  Skin:  Negative for rash.  Allergic/Immunologic: Negative for environmental allergies.  Neurological:  Negative for dizziness and headaches.  Psychiatric/Behavioral:  The patient is not nervous/anxious.          Past Medical History:  Diagnosis Date   Allergy    Anxiety    Depression    Plant irritant contact dermatitis 06/17/2019   Precancerous skin lesion     Social History   Socioeconomic History   Marital status: Married    Spouse name: Not on file   Number of children: Not on file   Years of education: Not on file   Highest education level: Bachelor's degree (e.g., BA, AB, BS)  Occupational History   Not on file  Tobacco Use   Smoking status: Never   Smokeless tobacco: Never  Vaping Use   Vaping status: Never Used  Substance and Sexual Activity   Alcohol use: Yes   Drug use: Never   Sexual activity: Yes  Other Topics Concern   Not on file  Social History Narrative   Married.   2 children.   Works as a IT sales professional.   Enjoys spending time  outdoors, going to the lake.   Social Drivers of Corporate investment banker Strain: Low Risk  (03/15/2024)   Overall Financial Resource Strain (CARDIA)    Difficulty of Paying Living Expenses: Not hard at all  Food Insecurity: No Food Insecurity (03/15/2024)   Hunger Vital Sign    Worried About Running Out of Food in the Last Year: Never true    Ran Out of Food in the Last Year: Never true  Transportation Needs: No Transportation Needs (03/15/2024)   PRAPARE - Administrator, Civil Service (Medical): No    Lack of Transportation (Non-Medical): No  Physical Activity: Insufficiently Active (03/15/2024)   Exercise Vital Sign    Days of Exercise per Week: 3 days    Minutes of Exercise per Session: 40 min  Stress: No Stress Concern Present (03/15/2024)   [employer] of Occupational Health - Occupational Stress Questionnaire    Feeling of Stress : Not at all  Social Connections: Moderately Isolated (03/15/2024)   Social Connection and Isolation Panel [NHANES]    Frequency of Communication with Friends and Family: Three times a week    Frequency of Social Gatherings with Friends and Family: Three times a week    Attends Religious Services: Never    Active Member of Clubs or Organizations: No    Attends Banker Meetings: Not on file    Marital Status: Married  Intimate Partner Violence: Not At Risk (03/02/2022)   Humiliation, Afraid, Rape, and Kick questionnaire    Fear of Current or Ex-Partner: No    Emotionally Abused: No    Physically Abused: No    Sexually Abused: No    Past Surgical History:  Procedure Laterality Date   KNEE ARTHROSCOPY WITH MEDIAL COLLATERAL LIGAMENT RECONSTRUCTION Right 09/23/2020   Procedure: RIGHT KNEE ARTHROSCOPY DEBRIDMENT  WITH MEDIAL COLLATERAL LIGAMENT RECONSTRUCTION;  Surgeon: Micheline Ahr, MD;  Location: [facility];  Service: Orthopedics;  Laterality: Right;   KNEE ARTHROSCOPY WITH MEDIAL PATELLAR FEMORAL LIGAMENT  RECONSTRUCTION Right 10/02/2019   Procedure: KNEE ARTHROSCOPY WITH MEDIAL PATELLAR FEMORAL LIGAMENT RECONSTRUCTION WITH ALLOGRAPH;  Surgeon: Micheline Ahr, MD;  Location: [facility];  Service: Orthopedics;  Laterality: Right;   KNEE SURGERY     x3    Family History  Problem Relation Age of Onset   Healthy Mother    Healthy Father    Colon cancer Maternal Grandfather    Diabetes Maternal Grandfather    Bladder Cancer Paternal Grandmother    Breast cancer Neg Hx     Allergies  Allergen Reactions   Codeine Nausea Only   Wound Dressing Adhesive    Benzoin Rash   Iodine Rash    Current Outpatient Medications on File Prior to Visit  Medication Sig Dispense Refill   cyanocobalamin  (VITAMIN B12) 1000 MCG/ML injection Inject 1 mL (1,000 mcg total) into the muscle every 30 (thirty) days. 3 mL 3   loratadine (CLARITIN) 10 MG tablet Take 10 mg by mouth daily as needed for allergies.     pantoprazole  (PROTONIX ) 20 MG tablet Take 1 tablet (20 mg total) by mouth daily for heartburn. 90 tablet 0   fluticasone  (FLONASE ) 50 MCG/ACT nasal spray Place 2 sprays into both nostrils daily. 16 g 0   No current facility-administered medications on file prior to visit.    BP 106/64   Pulse 75   Temp (!) 97.3 F (36.3 C) (Temporal)   Ht 5\' 9"  (1.753 m)   Wt 199 lb (90.3 kg)   LMP 03/10/2024 (Exact Date)   SpO2 98%   BMI 29.39 kg/m  Objective:   Physical Exam HENT:     Right Ear: Tympanic membrane and ear canal normal.     Left Ear: Tympanic membrane and ear canal normal.  Eyes:     Pupils: Pupils are equal, round, and reactive to light.  Cardiovascular:     Rate and Rhythm: Normal rate and regular rhythm.  Pulmonary:     Effort: Pulmonary effort is normal.     Breath sounds: Normal breath sounds.  Abdominal:     General: Bowel sounds are normal.     Palpations: Abdomen is soft.     Tenderness: There is no abdominal tenderness.  Musculoskeletal:        General: Normal  range of motion.     Cervical back: Neck supple.  Skin:    General: Skin is warm and dry.  Neurological:     Mental Status: She is alert and oriented to person, place, and time.     Cranial Nerves: No cranial nerve deficit.     Deep Tendon Reflexes:     Reflex Scores:      Patellar reflexes are 2+ on the right side and 2+ on the left side. Psychiatric:        Mood and Affect: Mood normal.  Assessment & Plan:  Preventative health care Assessment & Plan: Immunizations UTD. Pap smear UTD. Mammogram UTD Colonoscopy UTD, due 2028  Discussed the importance of a healthy diet and regular exercise in order for weight loss, and to reduce the risk of further co-morbidity.  Exam stable. Labs pending.  Follow up in 1 year for repeat physical.   Orders: -     Comprehensive metabolic panel with GFR -     Lipid panel -     Hemoglobin A1c  GAD (generalized anxiety disorder) Assessment & Plan: Controlled and improved.   Remain off Zoloft . Continue to monitor.    Vitamin B12 deficiency Assessment & Plan: Repeat B12 level pending.  Continue B12 injections monthly.   Orders: -     Vitamin B12  Iron  deficiency anemia due to chronic blood loss Assessment & Plan: Repeat iron  studies pending.  Orders: -     IBC + Ferritin -     CBC -     Comprehensive metabolic panel with GFR  Gastroesophageal reflux disease, unspecified whether esophagitis present Assessment & Plan: Controlled.  Continue pantoprazole  20 mg daily.          Neisha Hinger K Analisse Randle, NP

## 2024-03-19 NOTE — Assessment & Plan Note (Signed)
Immunizations UTD. Pap smear UTD. Mammogram UTD Colonoscopy UTD, due 2028  Discussed the importance of a healthy diet and regular exercise in order for weight loss, and to reduce the risk of further co-morbidity.  Exam stable. Labs pending.  Follow up in 1 year for repeat physical.  

## 2024-06-09 ENCOUNTER — Other Ambulatory Visit: Payer: Self-pay

## 2024-06-09 DIAGNOSIS — S6992XA Unspecified injury of left wrist, hand and finger(s), initial encounter: Secondary | ICD-10-CM | POA: Diagnosis not present

## 2024-06-09 MED ORDER — PREDNISONE 20 MG PO TABS
ORAL_TABLET | ORAL | 0 refills | Status: AC
  Filled 2024-06-09: qty 15, 10d supply, fill #0

## 2024-06-09 MED ORDER — TRAMADOL HCL 50 MG PO TABS
50.0000 mg | ORAL_TABLET | Freq: Four times a day (QID) | ORAL | 0 refills | Status: DC | PRN
Start: 2024-06-09 — End: 2024-10-06
  Filled 2024-06-09: qty 12, 3d supply, fill #0

## 2024-06-11 ENCOUNTER — Other Ambulatory Visit: Payer: Self-pay

## 2024-06-11 ENCOUNTER — Other Ambulatory Visit: Payer: Self-pay | Admitting: Primary Care

## 2024-06-11 DIAGNOSIS — K219 Gastro-esophageal reflux disease without esophagitis: Secondary | ICD-10-CM

## 2024-06-11 MED ORDER — PANTOPRAZOLE SODIUM 20 MG PO TBEC
20.0000 mg | DELAYED_RELEASE_TABLET | Freq: Every day | ORAL | 2 refills | Status: AC
  Filled 2024-06-11: qty 90, 90d supply, fill #0
  Filled 2024-09-11: qty 90, 90d supply, fill #1
  Filled 2024-12-10: qty 90, 90d supply, fill #2

## 2024-06-22 ENCOUNTER — Other Ambulatory Visit: Payer: Self-pay | Admitting: Adult Health

## 2024-06-22 ENCOUNTER — Other Ambulatory Visit: Payer: Self-pay

## 2024-06-22 DIAGNOSIS — E538 Deficiency of other specified B group vitamins: Secondary | ICD-10-CM

## 2024-06-22 MED ORDER — CYANOCOBALAMIN 1000 MCG/ML IJ SOLN
1000.0000 ug | INTRAMUSCULAR | 3 refills | Status: AC
Start: 2024-06-22 — End: ?
  Filled 2024-06-22: qty 3, 90d supply, fill #0
  Filled 2024-09-24: qty 3, 90d supply, fill #1

## 2024-06-26 ENCOUNTER — Other Ambulatory Visit: Payer: Self-pay | Admitting: Primary Care

## 2024-06-26 DIAGNOSIS — Z1231 Encounter for screening mammogram for malignant neoplasm of breast: Secondary | ICD-10-CM

## 2024-07-15 ENCOUNTER — Other Ambulatory Visit: Payer: Self-pay | Admitting: Primary Care

## 2024-07-15 DIAGNOSIS — E785 Hyperlipidemia, unspecified: Secondary | ICD-10-CM

## 2024-07-15 DIAGNOSIS — R7303 Prediabetes: Secondary | ICD-10-CM

## 2024-07-16 ENCOUNTER — Other Ambulatory Visit (INDEPENDENT_AMBULATORY_CARE_PROVIDER_SITE_OTHER)

## 2024-07-16 DIAGNOSIS — E785 Hyperlipidemia, unspecified: Secondary | ICD-10-CM | POA: Diagnosis not present

## 2024-07-16 DIAGNOSIS — R7303 Prediabetes: Secondary | ICD-10-CM | POA: Diagnosis not present

## 2024-07-16 LAB — LIPID PANEL
Cholesterol: 200 mg/dL (ref 0–200)
HDL: 61.1 mg/dL (ref >39.00)
LDL Cholesterol: 115 mg/dL — ABNORMAL HIGH (ref 0–99)
NonHDL: 138.77
Total CHOL/HDL Ratio: 3
Triglycerides: 119 mg/dL (ref 0.0–149.0)
VLDL: 23.8 mg/dL (ref 0.0–40.0)

## 2024-07-16 LAB — HEMOGLOBIN A1C: Hgb A1c MFr Bld: 6.2 % (ref 4.6–6.5)

## 2024-07-17 ENCOUNTER — Ambulatory Visit: Payer: Self-pay | Admitting: Primary Care

## 2024-07-17 DIAGNOSIS — R7303 Prediabetes: Secondary | ICD-10-CM

## 2024-07-18 ENCOUNTER — Other Ambulatory Visit: Payer: Self-pay

## 2024-07-18 MED ORDER — METFORMIN HCL ER 500 MG PO TB24
500.0000 mg | ORAL_TABLET | Freq: Every day | ORAL | 0 refills | Status: DC
  Filled 2024-07-18: qty 90, 90d supply, fill #0

## 2024-07-19 MED ORDER — FREESTYLE LIBRE 3 PLUS SENSOR MISC
1 refills | Status: DC
  Filled 2024-07-19: qty 6, 90d supply, fill #0

## 2024-07-20 ENCOUNTER — Other Ambulatory Visit: Payer: Self-pay

## 2024-07-21 MED ORDER — LANCET DEVICE MISC
1.0000 | Freq: Three times a day (TID) | 0 refills | Status: AC
  Filled 2024-07-21: qty 1, 30d supply, fill #0

## 2024-07-21 MED ORDER — BLOOD GLUCOSE TEST VI STRP
ORAL_STRIP | 5 refills | Status: AC
  Filled 2024-07-21: qty 100, 33d supply, fill #0

## 2024-07-21 MED ORDER — BLOOD GLUCOSE MONITOR SYSTEM W/DEVICE KIT
1.0000 | PACK | Freq: Three times a day (TID) | 0 refills | Status: AC
  Filled 2024-07-21: qty 1, 1d supply, fill #0

## 2024-07-21 MED ORDER — FREESTYLE LANCETS MISC
1.0000 | Freq: Every day | 5 refills | Status: AC
  Filled 2024-07-21: qty 100, 33d supply, fill #0

## 2024-07-22 ENCOUNTER — Encounter: Payer: Self-pay | Admitting: Hematology and Oncology

## 2024-07-22 ENCOUNTER — Other Ambulatory Visit: Payer: Self-pay

## 2024-07-23 ENCOUNTER — Other Ambulatory Visit: Payer: Self-pay

## 2024-08-06 ENCOUNTER — Encounter

## 2024-08-28 ENCOUNTER — Ambulatory Visit
Admission: RE | Admit: 2024-08-28 | Discharge: 2024-08-28 | Disposition: A | Discharge status: Home / Self Care | Source: Ambulatory Visit | Attending: Primary Care | Admitting: Primary Care

## 2024-08-28 DIAGNOSIS — Z1231 Encounter for screening mammogram for malignant neoplasm of breast: Secondary | ICD-10-CM | POA: Insufficient documentation

## 2024-09-03 ENCOUNTER — Ambulatory Visit: Payer: Self-pay | Admitting: Primary Care

## 2024-09-05 ENCOUNTER — Other Ambulatory Visit: Payer: Self-pay

## 2024-09-05 DIAGNOSIS — D2261 Melanocytic nevi of right upper limb, including shoulder: Secondary | ICD-10-CM | POA: Diagnosis not present

## 2024-09-05 DIAGNOSIS — L821 Other seborrheic keratosis: Secondary | ICD-10-CM | POA: Diagnosis not present

## 2024-09-05 DIAGNOSIS — R238 Other skin changes: Secondary | ICD-10-CM | POA: Diagnosis not present

## 2024-09-05 DIAGNOSIS — D2262 Melanocytic nevi of left upper limb, including shoulder: Secondary | ICD-10-CM | POA: Diagnosis not present

## 2024-09-05 DIAGNOSIS — D225 Melanocytic nevi of trunk: Secondary | ICD-10-CM | POA: Diagnosis not present

## 2024-09-05 DIAGNOSIS — D2271 Melanocytic nevi of right lower limb, including hip: Secondary | ICD-10-CM | POA: Diagnosis not present

## 2024-09-05 DIAGNOSIS — D2272 Melanocytic nevi of left lower limb, including hip: Secondary | ICD-10-CM | POA: Diagnosis not present

## 2024-09-05 DIAGNOSIS — B078 Other viral warts: Secondary | ICD-10-CM | POA: Diagnosis not present

## 2024-09-05 MED ORDER — FLUOROURACIL 5 % EX CREA
TOPICAL_CREAM | Freq: Every day | CUTANEOUS | 0 refills | Status: AC
  Filled 2024-09-05: qty 40, 30d supply, fill #0

## 2024-10-06 ENCOUNTER — Other Ambulatory Visit: Payer: Self-pay

## 2024-10-06 ENCOUNTER — Telehealth: Admitting: Physician Assistant

## 2024-10-06 ENCOUNTER — Encounter: Payer: Self-pay | Admitting: Hematology and Oncology

## 2024-10-06 DIAGNOSIS — J019 Acute sinusitis, unspecified: Secondary | ICD-10-CM | POA: Diagnosis not present

## 2024-10-06 DIAGNOSIS — B9689 Other specified bacterial agents as the cause of diseases classified elsewhere: Secondary | ICD-10-CM

## 2024-10-06 MED ORDER — AMOXICILLIN-POT CLAVULANATE 875-125 MG PO TABS
1.0000 | ORAL_TABLET | Freq: Two times a day (BID) | ORAL | 0 refills | Status: AC
  Filled 2024-10-06: qty 14, 7d supply, fill #0

## 2024-10-06 NOTE — Progress Notes (Signed)

## 2024-10-07 ENCOUNTER — Inpatient Hospital Stay: Payer: 59 | Attending: Adult Health | Admitting: Adult Health

## 2024-10-07 DIAGNOSIS — E538 Deficiency of other specified B group vitamins: Secondary | ICD-10-CM | POA: Diagnosis not present

## 2024-10-07 DIAGNOSIS — N951 Menopausal and female climacteric states: Secondary | ICD-10-CM

## 2024-10-07 DIAGNOSIS — R635 Abnormal weight gain: Secondary | ICD-10-CM | POA: Diagnosis not present

## 2024-10-10 ENCOUNTER — Encounter: Payer: Self-pay | Admitting: Hematology and Oncology

## 2024-10-10 ENCOUNTER — Encounter: Payer: Self-pay | Admitting: Adult Health

## 2024-10-10 NOTE — Progress Notes (Signed)
 [facility] Cancer Follow up:    Dawn [place] POUR, NP [address] [place] [id number]  I connected with Dawn [place] on 10/10/24 at  2:00 PM EST by telephone and verified that I am speaking with the correct person using two identifiers. I discussed the limitations, risks, security and privacy concerns of performing an evaluation and management service by telephone and the availability of in person appointments. I also discussed with the patient that there may be a patient responsible charge related to this service. The patient expressed understanding and agreed to proceed.   Patient location: home Provider location:  [facility] office Others participating in call: none   DIAGNOSIS: b12 deficiency, iron  deficiency   SUMMARY OF HEMATOLOGIC HISTORY: 48 y.o. woman with h/o iron  deficiency and b12 deficiency Iron  Deficiency--receives feraheme  intermittently Intermittent menorrhagia; intermittent IV iron  GI referral--upper endoscopy and colonoscopy, gastritis noted, protonix  started 2.   B12 Deficiency             A.   + anti-parietal antibodies on 03/03/2022             B.   monthly b12 injections in 02/2022  CURRENT THERAPY: b12 injections  INTERVAL HISTORY:  Continues on b12 injections IM monthly.  Tolerating well.  Has struggled with weight issues and rising A1c, exercises regularly and eats well balanced diet.  Perimenopausal age.    Patient Active Problem List   Diagnosis Date Noted   GERD (gastroesophageal reflux disease) 03/19/2024   GAD (generalized anxiety disorder) 02/28/2023   Vitamin B12 deficiency 12/18/2019   Iron  deficiency anemia 12/09/2018   Preventative health care 12/04/2016   Seasonal allergic rhinitis 11/27/2014    is allergic to codeine, wound dressing adhesive, benzoin, and iodine.  MEDICAL HISTORY: Past Medical History:  Diagnosis Date   Allergy    Anxiety    Depression    Plant irritant contact dermatitis 06/17/2019    Precancerous skin lesion     SURGICAL HISTORY: Past Surgical History:  Procedure Laterality Date   KNEE ARTHROSCOPY WITH MEDIAL COLLATERAL LIGAMENT RECONSTRUCTION Right 09/23/2020   Procedure: RIGHT KNEE ARTHROSCOPY DEBRIDMENT  WITH MEDIAL COLLATERAL LIGAMENT RECONSTRUCTION;  Surgeon: Cristy Bonner DASEN, MD;  Location: [facility];  Service: Orthopedics;  Laterality: Right;   KNEE ARTHROSCOPY WITH MEDIAL PATELLAR FEMORAL LIGAMENT RECONSTRUCTION Right 10/02/2019   Procedure: KNEE ARTHROSCOPY WITH MEDIAL PATELLAR FEMORAL LIGAMENT RECONSTRUCTION WITH ALLOGRAPH;  Surgeon: Cristy Bonner DASEN, MD;  Location: [facility];  Service: Orthopedics;  Laterality: Right;   KNEE SURGERY     x3    SOCIAL HISTORY: Social History   Socioeconomic History   Marital status: Married    Spouse name: Not on file   Number of children: Not on file   Years of education: Not on file   Highest education level: Bachelor's degree (e.g., BA, AB, BS)  Occupational History   Not on file  Tobacco Use   Smoking status: Never   Smokeless tobacco: Never  Vaping Use   Vaping status: Never Used  Substance and Sexual Activity   Alcohol use: Yes   Drug use: Never   Sexual activity: Yes  Other Topics Concern   Not on file  Social History Narrative   Married.   2 children.   Works as a It Sales Professional.   Enjoys spending time outdoors, going to the lake.   Social Drivers of Health   Financial Resource Strain: Low Risk  (06/09/2024)   Received  from [facility]   Overall Financial Resource Strain (CARDIA)    Difficulty of Paying Living Expenses: Not hard at all  Food Insecurity: No Food Insecurity (06/09/2024)   [facility] from [facility]   Hunger Vital Sign    Within the past 12 months, you worried that your food would run out before you got the money to buy more.: Never true    Within the past 12 months, the food you bought just didn't last and you didn't have  money to get more.: Never true  Transportation Needs: No Transportation Needs (06/09/2024)   Received from [facility] - Transportation    In the past 12 months, has lack of transportation kept you from medical appointments or from getting medications?: No    Lack of Transportation (Non-Medical): No  Physical Activity: Insufficiently Active (03/15/2024)   Exercise Vital Sign    Days of Exercise per Week: 3 days    Minutes of Exercise per Session: 40 min  Stress: No Stress Concern Present (03/15/2024)   [employer] of Occupational Health - Occupational Stress Questionnaire    Feeling of Stress : Not at all  Social Connections: Moderately Isolated (03/15/2024)   Social Connection and Isolation Panel    Frequency of Communication with Friends and Family: Three times a week    Frequency of Social Gatherings with Friends and Family: Three times a week    Attends Religious Services: Never    Active Member of Clubs or Organizations: No    Attends Banker Meetings: Not on file    Marital Status: Married  Catering Manager Violence: Not At Risk (03/02/2022)   Humiliation, Afraid, Rape, and Kick questionnaire    Fear of Current or Ex-Partner: No    Emotionally Abused: No    Physically Abused: No    Sexually Abused: No    FAMILY HISTORY: Family History  Problem Relation Age of Onset   Healthy Mother    Healthy Father    Colon cancer Maternal Grandfather    Diabetes Maternal Grandfather    Bladder Cancer Paternal Grandmother    Breast cancer Neg Hx     Review of Systems  Constitutional:  Negative for appetite change, chills, fatigue, fever and unexpected weight change.  HENT:   Negative for hearing loss, lump/mass and trouble swallowing.   Eyes:  Negative for eye problems and icterus.  Respiratory:  Negative for chest tightness, cough and shortness of breath.   Cardiovascular:  Negative for chest pain, leg swelling and palpitations.   Gastrointestinal:  Negative for abdominal distention, abdominal pain, constipation, diarrhea, nausea and vomiting.  Endocrine: Negative for hot flashes.  Genitourinary:  Negative for difficulty urinating.   Musculoskeletal:  Negative for arthralgias.  Skin:  Negative for itching and rash.  Neurological:  Negative for dizziness, extremity weakness, headaches and numbness.  Hematological:  Negative for adenopathy. Does not bruise/bleed easily.  Psychiatric/Behavioral:  Negative for depression. The patient is not nervous/anxious.       PHYSICAL EXAMINATION Patient sounds well, no apparent distress, mood and behavior are normal, speech is normal, breathing is non labored   LABORATORY DATA:  CBC    Component Value Date/Time   WBC 6.5 03/19/2024 0753   RBC 5.29 (H) 03/19/2024 0753   HGB 15.6 (H) 03/19/2024 0753   HGB 15.4 (H) 10/01/2023 0809   HCT 47.1 (H) 03/19/2024 0753   PLT 374.0 03/19/2024 0753   PLT 363 10/01/2023 0809  MCV 89.0 03/19/2024 0753   MCH 30.3 10/01/2023 0809   MCHC 33.2 03/19/2024 0753   RDW 13.3 03/19/2024 0753   LYMPHSABS 2.3 10/01/2023 0809   MONOABS 0.6 10/01/2023 0809   EOSABS 0.3 10/01/2023 0809   BASOSABS 0.1 10/01/2023 0809    CMP     Component Value Date/Time   NA 139 03/19/2024 0753   K 4.6 03/19/2024 0753   CL 103 03/19/2024 0753   CO2 29 03/19/2024 0753   GLUCOSE 90 03/19/2024 0753   BUN 16 03/19/2024 0753   CREATININE 0.89 03/19/2024 0753   CREATININE 0.92 10/01/2023 0809   CALCIUM 9.6 03/19/2024 0753   PROT 6.7 03/19/2024 0753   ALBUMIN 4.4 03/19/2024 0753   AST 16 03/19/2024 0753   AST 17 10/01/2023 0809   ALT 11 03/19/2024 0753   ALT 12 10/01/2023 0809   ALKPHOS 80 03/19/2024 0753   BILITOT 0.7 03/19/2024 0753   BILITOT 0.6 10/01/2023 0809   GFRNONAA >60 10/01/2023 0809     ASSESSMENT and THERAPY PLAN:   B12 deficiency, continue monthly IM b12 injection.  I recommend that she undergo repeat testing when she sees her PCP  next week.  Weight gain despite continued exercise and healthy diet, rising A1c, question hormonal contribution considering her entering perimenopausal state.  Referral made to [facility] of [place] for discussion with one of their providers who is certified in menopause.   Will decide about follow up once we get patient's lab results from her PCP.    The patient was provided an opportunity to ask questions and all were answered. The patient agreed with the plan and demonstrated an understanding of the instructions.   The patient was advised to call back or seek an in-person evaluation if the symptoms worsen or if the condition fails to improve as anticipated.   I provided 10 minutes of non face-to-face telephone visit time during this encounter, and > 50% was spent counseling as documented under my assessment & plan.  Morna Kendall, NP 10/10/24 3:10 PM Medical Oncology and Hematology [facility] [address] [place], [place] [address] Tel. [phone]    Fax. [phone]  *Total Encounter Time as defined by the Centers for Medicare and Medicaid Services includes, in addition to the face-to-face time of a patient visit (documented in the note above) non-face-to-face time: obtaining and reviewing outside history, ordering and reviewing medications, tests or procedures, care coordination (communications with other health care professionals or caregivers) and documentation in the medical record.

## 2024-10-16 ENCOUNTER — Encounter: Payer: Self-pay | Admitting: Hematology and Oncology

## 2024-10-17 ENCOUNTER — Other Ambulatory Visit: Payer: Self-pay | Admitting: Primary Care

## 2024-10-17 ENCOUNTER — Ambulatory Visit: Admitting: Primary Care

## 2024-10-17 ENCOUNTER — Other Ambulatory Visit: Payer: Self-pay

## 2024-10-17 ENCOUNTER — Ambulatory Visit: Payer: Self-pay | Admitting: Primary Care

## 2024-10-17 VITALS — BP 120/76 | HR 78 | Temp 98.7°F | Ht 69.0 in | Wt 199.0 lb

## 2024-10-17 DIAGNOSIS — R7303 Prediabetes: Secondary | ICD-10-CM

## 2024-10-17 DIAGNOSIS — E538 Deficiency of other specified B group vitamins: Secondary | ICD-10-CM | POA: Diagnosis not present

## 2024-10-17 DIAGNOSIS — D500 Iron deficiency anemia secondary to blood loss (chronic): Secondary | ICD-10-CM

## 2024-10-17 LAB — CBC WITH DIFFERENTIAL/PLATELET
Basophils Absolute: 0.1 K/uL (ref 0.0–0.1)
Basophils Relative: 0.8 % (ref 0.0–3.0)
Eosinophils Absolute: 0.2 K/uL (ref 0.0–0.7)
Eosinophils Relative: 2.6 % (ref 0.0–5.0)
HCT: 45.6 % (ref 36.0–46.0)
Hemoglobin: 15.4 g/dL — ABNORMAL HIGH (ref 12.0–15.0)
Lymphocytes Relative: 33.6 % (ref 12.0–46.0)
Lymphs Abs: 2.5 K/uL (ref 0.7–4.0)
MCHC: 33.7 g/dL (ref 30.0–36.0)
MCV: 86.9 fl (ref 78.0–100.0)
Monocytes Absolute: 0.6 K/uL (ref 0.1–1.0)
Monocytes Relative: 8.4 % (ref 3.0–12.0)
Neutro Abs: 4.1 K/uL (ref 1.4–7.7)
Neutrophils Relative %: 54.6 % (ref 43.0–77.0)
Platelets: 386 K/uL (ref 150.0–400.0)
RBC: 5.25 Mil/uL — ABNORMAL HIGH (ref 3.87–5.11)
RDW: 12.9 % (ref 11.5–15.5)
WBC: 7.5 K/uL (ref 4.0–10.5)

## 2024-10-17 LAB — VITAMIN B12: Vitamin B-12: 497 pg/mL (ref 211–911)

## 2024-10-17 LAB — IBC + FERRITIN
Ferritin: 25.8 ng/mL (ref 10.0–291.0)
Iron: 113 ug/dL (ref 42–145)
Saturation Ratios: 30.3 % (ref 20.0–50.0)
TIBC: 372.4 ug/dL (ref 250.0–450.0)
Transferrin: 266 mg/dL (ref 212.0–360.0)

## 2024-10-17 LAB — HEMOGLOBIN A1C: Hgb A1c MFr Bld: 5.7 % (ref 4.6–6.5)

## 2024-10-17 MED FILL — Metformin HCl Tab ER 24HR 500 MG: ORAL | 90 days supply | Qty: 90 | Fill #0 | Status: AC

## 2024-10-17 NOTE — Assessment & Plan Note (Signed)
 CBC and iron  studies pending  Follow with hematology

## 2024-10-17 NOTE — Assessment & Plan Note (Signed)
 Repeat A1C pending today.  Commended her on her efforts to keep a healthy lifestyle. Continue metformin  ER 500 mg daily.  Await results.

## 2024-10-17 NOTE — Progress Notes (Signed)
 Subjective:    Patient ID: Dawn Hamilton, female    DOB: 04-03-1976, 48 y.o.   MRN: [id number]  Dawn Hamilton is a very pleasant 48 y.o. female with a history of GAD, iron  deficiency anemia, prediabetes who presents today for follow up of diabetes.  History of prediabetes that was originally diagnosed in May 2025 with A1C of 5.8.  Repeat A1c in September 2025 increased to 6.2 so she was initiated on metformin  ER 500 mg daily.  She is here today for follow-up and repeat A1c.  Since her last A1C test in September 2025 she is active three to five days weekly, has decreased intake of carbs, eating home cooked meals. She mostly drinks water.   She has been checking her glucose levels which ranges 70-110s.  Her highest readings are in the mornings. She's been under a lot of stress over the last 2 years.  She denies GI upset, diarrhea, nausea since taking metformin   She is also needing repeat B12, CBC, and ferritin levels per hematology.   Wt Readings from Last 3 Encounters:  10/17/24 199 lb (90.3 kg)  03/19/24 199 lb (90.3 kg)  03/30/23 192 lb 12.8 oz (87.5 kg)      Review of Systems  Respiratory:  Negative for shortness of breath.   Cardiovascular:  Negative for chest pain.  Gastrointestinal:  Negative for abdominal pain, constipation and nausea.  Neurological:  Negative for headaches.         Past Medical History:  Diagnosis Date   Allergy    Anxiety    Depression    Plant irritant contact dermatitis 06/17/2019   Precancerous skin lesion     Social History   Socioeconomic History   Marital status: Married    Spouse name: Not on file   Number of children: Not on file   Years of education: Not on file   Highest education level: Bachelor's degree (e.g., BA, AB, BS)  Occupational History   Not on file  Tobacco Use   Smoking status: Never   Smokeless tobacco: Never  Vaping Use   Vaping status: Never Used  Substance and Sexual Activity   Alcohol use: Yes    Drug use: Never   Sexual activity: Yes  Other Topics Concern   Not on file  Social History Narrative   Married.   2 children.   Works as a It Sales Professional.   Enjoys spending time outdoors, going to the lake.   Social Drivers of Corporate Investment Banker Strain: Low Risk  (10/13/2024)   Overall Financial Resource Strain (CARDIA)    Difficulty of Paying Living Expenses: Not hard at all  Food Insecurity: No Food Insecurity (10/13/2024)   Hunger Vital Sign    Worried About Running Out of Food in the Last Year: Never true    Ran Out of Food in the Last Year: Never true  Transportation Needs: No Transportation Needs (10/13/2024)   PRAPARE - Administrator, Civil Service (Medical): No    Lack of Transportation (Non-Medical): No  Physical Activity: Insufficiently Active (10/13/2024)   Exercise Vital Sign    Days of Exercise per Week: 4 days    Minutes of Exercise per Session: 30 min  Stress: No Stress Concern Present (10/13/2024)   [employer] of Occupational Health - Occupational Stress Questionnaire    Feeling of Stress: Only a little  Social Connections: Moderately Integrated (10/13/2024)   Social Connection and Isolation Panel    Frequency  of Communication with Friends and Family: Once a week    Frequency of Social Gatherings with Friends and Family: Twice a week    Attends Religious Services: 1 to 4 times per year    Active Member of [employer] or Organizations: No    Attends Engineer, Structural: Not on file    Marital Status: Married  Catering Manager Violence: Not At Risk (03/02/2022)   Humiliation, Afraid, Rape, and Kick questionnaire    Fear of Current or Ex-Partner: No    Emotionally Abused: No    Physically Abused: No    Sexually Abused: No    Past Surgical History:  Procedure Laterality Date   KNEE ARTHROSCOPY WITH MEDIAL COLLATERAL LIGAMENT RECONSTRUCTION Right 09/23/2020   Procedure: RIGHT KNEE ARTHROSCOPY DEBRIDMENT  WITH MEDIAL COLLATERAL LIGAMENT  RECONSTRUCTION;  Surgeon: Cristy Bonner DASEN, MD;  Location: [facility];  Service: Orthopedics;  Laterality: Right;   KNEE ARTHROSCOPY WITH MEDIAL PATELLAR FEMORAL LIGAMENT RECONSTRUCTION Right 10/02/2019   Procedure: KNEE ARTHROSCOPY WITH MEDIAL PATELLAR FEMORAL LIGAMENT RECONSTRUCTION WITH ALLOGRAPH;  Surgeon: Cristy Bonner DASEN, MD;  Location: [facility];  Service: Orthopedics;  Laterality: Right;   KNEE SURGERY     x3    Family History  Problem Relation Age of Onset   Healthy Mother    Healthy Father    Colon cancer Maternal Grandfather    Diabetes Maternal Grandfather    Bladder Cancer Paternal Grandmother    Breast cancer Neg Hx     Allergies  Allergen Reactions   Codeine Nausea Only   Wound Dressing Adhesive    Benzoin Rash   Iodine Rash    Current Outpatient Medications on File Prior to Visit  Medication Sig Dispense Refill   amoxicillin -clavulanate (AUGMENTIN ) 875-125 MG tablet Take 1 tablet by mouth 2 (two) times daily. 14 tablet 0   Blood Glucose Monitoring Suppl (BLOOD GLUCOSE MONITOR SYSTEM) w/Device KIT Use to check blood sugar in the morning, at noon, and at bedtime. 1 kit 0   cyanocobalamin  (VITAMIN B12) 1000 MCG/ML injection Inject 1 mL (1,000 mcg total) into the muscle every 30 (thirty) days. 3 mL 3   fluorouracil  (EFUDEX ) 5 % cream Apply a thin layer to warts once daily at bedtime then cover overnight and rinse in the morning until cleared. 40 g 0   fluticasone  (FLONASE ) 50 MCG/ACT nasal spray Place 2 sprays into both nostrils daily. 16 g 0   Glucose Blood (BLOOD GLUCOSE TEST STRIPS) STRP Check blood sugars up to 3 times daily per meter directions. May substitute to any manufacturer covered by patient's insurance. 100 strip 5   Lancets (FREESTYLE) lancets Use to check blood sugar up to three times daily per Meter directions. 100 each 5   loratadine (CLARITIN) 10 MG tablet Take 10 mg by mouth daily as needed for allergies.     metFORMIN   (GLUCOPHAGE -XR) 500 MG 24 hr tablet Take 1 tablet (500 mg total) by mouth daily with breakfast. For blood sugar 90 tablet 0   pantoprazole  (PROTONIX ) 20 MG tablet Take 1 tablet (20 mg total) by mouth daily for heartburn. 90 tablet 2   No current facility-administered medications on file prior to visit.    BP 120/76   Pulse 78   Temp 98.7 F (37.1 C) (Oral)   Ht 5' 9 (1.753 m)   Wt 199 lb (90.3 kg)   SpO2 98%   BMI 29.39 kg/m  Objective:   Physical Exam Cardiovascular:  Rate and Rhythm: Normal rate and regular rhythm.  Pulmonary:     Effort: Pulmonary effort is normal.     Breath sounds: Normal breath sounds.  Musculoskeletal:     Cervical back: Neck supple.  Skin:    General: Skin is warm and dry.  Neurological:     Mental Status: She is alert and oriented to person, place, and time.  Psychiatric:        Mood and Affect: Mood normal.     Physical Exam        Assessment & Plan:  Prediabetes Assessment & Plan: Repeat A1C pending today.  Commended her on her efforts to keep a healthy lifestyle. Continue metformin  ER 500 mg daily.  Await results.   Orders: -     Hemoglobin A1c  Iron  deficiency anemia due to chronic blood loss Assessment & Plan: CBC and iron  studies pending  Follow with hematology  Orders: -     CBC with Differential/Platelet -     IBC + Ferritin  Vitamin B12 deficiency Assessment & Plan: Repeat vitamin B12 level pending. Continue B12 injections monthly  Orders: -     Vitamin B12    Assessment and Plan Assessment & Plan         Comer MARLA Gaskins, NP

## 2024-10-17 NOTE — Assessment & Plan Note (Signed)
 Repeat vitamin B12 level pending. Continue B12 injections monthly

## 2024-10-20 ENCOUNTER — Other Ambulatory Visit: Payer: Self-pay

## 2024-11-10 DIAGNOSIS — H524 Presbyopia: Secondary | ICD-10-CM | POA: Diagnosis not present

## 2025-03-20 ENCOUNTER — Encounter: Admitting: Primary Care
